# Patient Record
Sex: Male | Born: 1960 | Race: White | Hispanic: No | Marital: Married | Smoking: Never smoker
Health system: Southern US, Community
[De-identification: ages and names within clinical notes are randomized; demographics above are authoritative.]

## PROBLEM LIST (undated history)

## (undated) DIAGNOSIS — T7840XA Allergy, unspecified, initial encounter: Secondary | ICD-10-CM

## (undated) DIAGNOSIS — I10 Essential (primary) hypertension: Secondary | ICD-10-CM

## (undated) DIAGNOSIS — E785 Hyperlipidemia, unspecified: Secondary | ICD-10-CM

## (undated) HISTORY — DX: Essential (primary) hypertension: I10

## (undated) HISTORY — PX: TENDON REPAIR: SHX5111

## (undated) HISTORY — DX: Hyperlipidemia, unspecified: E78.5

## (undated) HISTORY — PX: HERNIA REPAIR: SHX51

## (undated) HISTORY — DX: Allergy, unspecified, initial encounter: T78.40XA

## (undated) HISTORY — PX: KNEE SURGERY: SHX244

## (undated) HISTORY — PX: OTHER SURGICAL HISTORY: SHX169

## (undated) HISTORY — PX: ROTATOR CUFF REPAIR: SHX139

---

## 1997-07-03 ENCOUNTER — Ambulatory Visit (HOSPITAL_COMMUNITY): Admission: RE | Admit: 1997-07-03 | Discharge: 1997-07-03 | Payer: Self-pay | Admitting: Orthopedic Surgery

## 1999-02-23 ENCOUNTER — Encounter: Payer: Self-pay | Admitting: Orthopedic Surgery

## 1999-02-23 ENCOUNTER — Ambulatory Visit (HOSPITAL_COMMUNITY): Admission: RE | Admit: 1999-02-23 | Discharge: 1999-02-23 | Payer: Self-pay | Admitting: Orthopedic Surgery

## 1999-11-05 ENCOUNTER — Ambulatory Visit (HOSPITAL_BASED_OUTPATIENT_CLINIC_OR_DEPARTMENT_OTHER): Admission: RE | Admit: 1999-11-05 | Discharge: 1999-11-05 | Payer: Self-pay | Admitting: Orthopedic Surgery

## 2003-05-09 ENCOUNTER — Ambulatory Visit (HOSPITAL_COMMUNITY): Admission: RE | Admit: 2003-05-09 | Discharge: 2003-05-09 | Payer: Self-pay | Admitting: General Surgery

## 2004-02-05 ENCOUNTER — Ambulatory Visit: Payer: Self-pay | Admitting: Family Medicine

## 2004-02-21 ENCOUNTER — Ambulatory Visit: Payer: Self-pay | Admitting: Family Medicine

## 2004-05-20 ENCOUNTER — Ambulatory Visit: Payer: Self-pay | Admitting: Family Medicine

## 2004-06-19 ENCOUNTER — Ambulatory Visit: Payer: Self-pay | Admitting: Family Medicine

## 2011-04-05 ENCOUNTER — Other Ambulatory Visit (HOSPITAL_COMMUNITY): Payer: Self-pay | Admitting: Urology

## 2011-04-05 ENCOUNTER — Ambulatory Visit (HOSPITAL_COMMUNITY)
Admission: RE | Admit: 2011-04-05 | Discharge: 2011-04-05 | Disposition: A | Discharge status: Home / Self Care | Payer: BC Managed Care – PPO | Source: Ambulatory Visit | Attending: Urology | Admitting: Urology

## 2011-04-05 DIAGNOSIS — R109 Unspecified abdominal pain: Secondary | ICD-10-CM | POA: Insufficient documentation

## 2011-04-05 DIAGNOSIS — N201 Calculus of ureter: Secondary | ICD-10-CM

## 2013-11-19 ENCOUNTER — Ambulatory Visit: Payer: BC Managed Care – PPO | Attending: Orthopedic Surgery | Admitting: Physical Therapy

## 2013-11-19 DIAGNOSIS — J45909 Unspecified asthma, uncomplicated: Secondary | ICD-10-CM | POA: Insufficient documentation

## 2013-11-19 DIAGNOSIS — R5381 Other malaise: Secondary | ICD-10-CM | POA: Diagnosis not present

## 2013-11-19 DIAGNOSIS — Z5189 Encounter for other specified aftercare: Secondary | ICD-10-CM | POA: Insufficient documentation

## 2013-11-19 DIAGNOSIS — I10 Essential (primary) hypertension: Secondary | ICD-10-CM | POA: Insufficient documentation

## 2013-11-19 DIAGNOSIS — M6281 Muscle weakness (generalized): Secondary | ICD-10-CM | POA: Insufficient documentation

## 2013-11-20 ENCOUNTER — Ambulatory Visit: Payer: BC Managed Care – PPO | Admitting: Physical Therapy

## 2013-11-26 ENCOUNTER — Ambulatory Visit: Payer: BC Managed Care – PPO | Admitting: Physical Therapy

## 2013-11-26 DIAGNOSIS — Z5189 Encounter for other specified aftercare: Secondary | ICD-10-CM | POA: Diagnosis not present

## 2013-11-30 ENCOUNTER — Telehealth: Payer: Self-pay | Admitting: Family Medicine

## 2013-11-30 NOTE — Telephone Encounter (Signed)
Pt given appt with dr Hyacinth Meekermiller for new pt and CPE,advised to arrive 15 minutes early and bring any current medications, he needs physical for work.

## 2014-02-07 ENCOUNTER — Ambulatory Visit: Payer: Self-pay | Admitting: Family Medicine

## 2015-07-23 ENCOUNTER — Ambulatory Visit (INDEPENDENT_AMBULATORY_CARE_PROVIDER_SITE_OTHER): Payer: Worker's Compensation

## 2015-07-23 ENCOUNTER — Ambulatory Visit (INDEPENDENT_AMBULATORY_CARE_PROVIDER_SITE_OTHER): Payer: Worker's Compensation | Admitting: Family Medicine

## 2015-07-23 ENCOUNTER — Encounter: Payer: Self-pay | Admitting: Family Medicine

## 2015-07-23 VITALS — BP 143/88 | HR 77 | Temp 97.5°F | Ht 67.0 in | Wt 200.0 lb

## 2015-07-23 DIAGNOSIS — M25511 Pain in right shoulder: Secondary | ICD-10-CM

## 2015-07-23 DIAGNOSIS — S46211A Strain of muscle, fascia and tendon of other parts of biceps, right arm, initial encounter: Secondary | ICD-10-CM | POA: Diagnosis not present

## 2015-07-23 MED ORDER — PREDNISONE 20 MG PO TABS: ORAL_TABLET | ORAL | Status: DC

## 2015-07-23 NOTE — Progress Notes (Signed)
BP 143/88 mmHg  Pulse 77  Temp(Src) 97.5 F (36.4 C) (Oral)  Ht  (1.702 m)  Wt 200 lb (90.719 kg)  BMI 31.32 kg/m2   Subjective:    Patient ID: Scott Chapman, male    DOB: 1960-12-06, 55 y.o.   MRN: [id number]  HPI: Scott Chapman is a 55 y.o. male presenting on 07/23/2015 for right shoulder pain   HPI Right shoulder pain Patient is coming in for worker's comp visit for right shoulder pain. He works at [employer]. The injury occurred on 07/18/2015. He was working in a forklift and hydraulic fluid spilled and when he was getting off his forklift he slipped on the oil and came down and landed on his right arm outstretched behind them. Since that time he thought it wasn't a big deal initially but he has been having increasing pain and soreness in that right arm. Today he says the pain is 6 out of 10. The pain is worse anteriorly and occasionally he feels it going down his bicep into his arm. He denies any numbness or weakness in that arm. He denies any pain in his neck or his upper back. He does have a little stiffness in his neck and back musculature though. He says it is worse with twisting and turning movements of the right arm like using a screwdriver.  Relevant past medical, surgical, family and social history reviewed and updated as indicated. Interim medical history since our last visit reviewed. Allergies and medications reviewed and updated.  Review of Systems  Constitutional: Negative for fever.  HENT: Negative for ear discharge and ear pain.   Eyes: Negative for discharge and visual disturbance.  Respiratory: Negative for shortness of breath and wheezing.   Cardiovascular: Negative for chest pain and leg swelling.  Gastrointestinal: Negative for abdominal pain, diarrhea and constipation.  Genitourinary: Negative for difficulty urinating.  Musculoskeletal: Positive for myalgias, arthralgias and neck stiffness. Negative for back pain, joint swelling, gait problem and  neck pain.  Skin: Negative for rash.  Neurological: Negative for syncope, light-headedness and headaches.  All other systems reviewed and are negative.   Per HPI unless specifically indicated above     Medication List       This list is accurate as of: 07/23/15  4:46 PM.  Always use your most recent med list.               clobetasol 0.05 % topical foam  Commonly known as:  OLUX  APPLY TOPICALLY 2 (TWO) TIMES DAILY.     olmesartan-hydrochlorothiazide 20-12.5 MG tablet  Commonly known as:  BENICAR HCT     pravastatin 40 MG tablet  Commonly known as:  PRAVACHOL     albuterol (2.5 MG/3ML) 0.083% nebulizer solution  Commonly known as:  PROVENTIL     PROAIR HFA 108 (90 Base) MCG/ACT inhaler  Generic drug:  albuterol     SYMBICORT 160-4.5 MCG/ACT inhaler  Generic drug:  budesonide-formoterol  USE 2 PUFFS INTO THE LUNGS 2 TIMES A DAY     VIAGRA 100 MG tablet  Generic drug:  sildenafil  TAKE 1 TABLET (100 MG TOTAL) BY MOUTH AS NEEDED FOR ERECTILE DYSFUNCTION.           Objective:    BP 143/88 mmHg  Pulse 77  Temp(Src) 97.5 F (36.4 C) (Oral)  Ht  (1.702 m)  Wt 200 lb (90.719 kg)  BMI 31.32 kg/m2  Wt Readings from Last 3 Encounters:  07/23/15  200 lb (90.719 kg)    Physical Exam  Constitutional: He is oriented to person, place, and time. He appears well-developed and well-nourished. No distress.  Eyes: Conjunctivae and EOM are normal. Pupils are equal, round, and reactive to light. Right eye exhibits no discharge. No scleral icterus.  Neck: Neck supple. No thyromegaly present.  Cardiovascular: Normal rate, regular rhythm, normal heart sounds and intact distal pulses.   No murmur heard. Pulmonary/Chest: Effort normal and breath sounds normal. No respiratory distress. He has no wheezes.  Musculoskeletal: Normal range of motion. He exhibits no edema.       Right shoulder: Normal. He exhibits normal range of motion, no tenderness, no bony tenderness, no  swelling, no effusion, no crepitus, no deformity and no pain.       Right elbow: Tenderness (Pain in upper bicipital tendon with supination and pronation) found.  Lymphadenopathy:    He has no cervical adenopathy.  Neurological: He is alert and oriented to person, place, and time. Coordination normal.  Skin: Skin is warm and dry. No rash noted. He is not diaphoretic.  Psychiatric: He has a normal mood and affect. His behavior is normal.  Nursing note and vitals reviewed.   Shoulder x-ray: No signs of acute bony abnormality, await final read by radiology    Assessment & Plan:   Problem List Items Addressed This Visit    None    Visit Diagnoses    Right shoulder pain    -  Primary    Relevant Medications    predniSONE (DELTASONE) 20 MG tablet    Other Relevant Orders    DG Shoulder Right (Completed)    Strain of biceps tendon, right, initial encounter        Relevant Medications    predniSONE (DELTASONE) 20 MG tablet        Follow up plan: Return if symptoms worsen or fail to improve.  Counseling provided for all of the vaccine components Orders Placed This Encounter  Procedures  . DG Shoulder Right    Arville CareJoshua Dettinger, MD [facility] 07/23/2015, 4:46 PM

## 2015-07-25 ENCOUNTER — Encounter: Payer: Self-pay | Admitting: Family Medicine

## 2015-08-04 ENCOUNTER — Ambulatory Visit (INDEPENDENT_AMBULATORY_CARE_PROVIDER_SITE_OTHER): Payer: Worker's Compensation | Admitting: Family Medicine

## 2015-08-04 ENCOUNTER — Encounter: Payer: Self-pay | Admitting: Family Medicine

## 2015-08-04 VITALS — BP 133/86 | HR 86 | Temp 97.1°F | Ht 67.0 in | Wt 202.8 lb

## 2015-08-04 DIAGNOSIS — M25511 Pain in right shoulder: Secondary | ICD-10-CM | POA: Diagnosis not present

## 2015-08-04 DIAGNOSIS — S46211A Strain of muscle, fascia and tendon of other parts of biceps, right arm, initial encounter: Secondary | ICD-10-CM

## 2015-08-04 NOTE — Progress Notes (Signed)
BP 133/86 (BP Location: Left Arm, Patient Position: Sitting, Cuff Size: Large)   Pulse 86   Temp 97.1 F (36.2 C) (Oral)   Ht  (1.702 m)   Wt 202 lb 12.8 oz (92 kg)   BMI 31.76 kg/m    Subjective:    Patient ID: Scott Chapman, male    DOB: [birth year], 55 y.o.   MRN: [id number]  HPI: JODI KAPPES is a 55 y.o. male presenting on 08/04/2015 for [facility] followup right shoulder pain   HPI Right shoulder pain Patient is coming in for follow-up for worker's comp on right shoulder pain. The injury occurred on 07/18/2015. He works for [employer]. 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 The pain today is 2 out of 10 with certain movements but gone at rest. The pain has improved but he still feels like he has some weakness especially with grip and supination and pronation. He denies any numbness in the arm. He is still little stiff but has full range of motion. He is most concerned that he still has a popping or catching in the shoulder when he pronates and supinates and that it feels weak.  Relevant past medical, surgical, family and social history reviewed and updated as indicated. Interim medical history since our last visit reviewed. Allergies and medications reviewed and updated.  Review of Systems  Constitutional: Negative for fever.  HENT: Negative for ear discharge and ear pain.   Eyes: Negative for discharge and visual disturbance.  Respiratory: Negative for shortness of breath and wheezing.   Cardiovascular: Negative for chest pain and leg swelling.  Gastrointestinal: Negative for abdominal pain, constipation and diarrhea.  Genitourinary: Negative for difficulty urinating.  Musculoskeletal: Positive for arthralgias, myalgias and neck stiffness. Negative for back pain, gait problem, joint swelling and neck pain.  Skin: Negative for rash.  Neurological: Negative  for syncope, light-headedness and headaches.  All other systems reviewed and are negative.   Per HPI unless specifically indicated above     Medication List       Accurate as of 08/04/15  5:04 PM. Always use your most recent med list.          clobetasol 0.05 % topical foam Commonly known as:  OLUX APPLY TOPICALLY 2 (TWO) TIMES DAILY.   olmesartan-hydrochlorothiazide 20-12.5 MG tablet Commonly known as:  BENICAR HCT   pravastatin 40 MG tablet Commonly known as:  PRAVACHOL   predniSONE 20 MG tablet Commonly known as:  DELTASONE 2 po at same time daily for 5 days   albuterol (2.5 MG/3ML) 0.083% nebulizer solution Commonly known as:  PROVENTIL   PROAIR HFA 108 (90 Base) MCG/ACT inhaler Generic drug:  albuterol   SYMBICORT 160-4.5 MCG/ACT inhaler Generic drug:  budesonide-formoterol USE 2 PUFFS INTO THE LUNGS 2 TIMES A DAY   VIAGRA 100 MG tablet Generic drug:  sildenafil TAKE 1 TABLET (100 MG TOTAL) BY MOUTH AS NEEDED FOR ERECTILE DYSFUNCTION.          Objective:    BP 133/86 (BP Location: Left Arm, Patient Position: Sitting, Cuff Size: Large)   Pulse 86   Temp 97.1 F (36.2 C) (Oral)   Ht  (1.702 m)   Wt 202 lb 12.8 oz (92 kg)   BMI 31.76 kg/m   Wt Readings from Last 3 Encounters:  08/04/15 202 lb 12.8 oz (92  kg)  07/23/15 200 lb (90.7 kg)    Physical Exam  Constitutional: He is oriented to person, place, and time. He appears well-developed and well-nourished. No distress.  Eyes: Conjunctivae and EOM are normal. Pupils are equal, round, and reactive to light. Right eye exhibits no discharge. No scleral icterus.  Neck: Neck supple. No thyromegaly present.  Cardiovascular: Normal rate, regular rhythm, normal heart sounds and intact distal pulses.   No murmur heard. Pulmonary/Chest: Effort normal and breath sounds normal. No respiratory distress. He has no wheezes.  Musculoskeletal: Normal range of motion. He exhibits no edema.       Right shoulder:  Normal. He exhibits normal range of motion, no tenderness, no bony tenderness, no swelling, no effusion, no crepitus, no deformity and no pain.       Right elbow: Tenderness (Pain in upper bicipital tendon with supination and pronation. Patient has perceived weakness but not notable on exam) found.  Lymphadenopathy:    He has no cervical adenopathy.  Neurological: He is alert and oriented to person, place, and time. Coordination normal.  Skin: Skin is warm and dry. No rash noted. He is not diaphoretic.  Psychiatric: He has a normal mood and affect. His behavior is normal.  Nursing note and vitals reviewed.   No results found for this or any previous visit.    Assessment & Plan:   Problem List Items Addressed This Visit    None    Visit Diagnoses    Right shoulder pain    -  Primary   Relevant Orders   MR Shoulder Right Wo Contrast   Strain of biceps tendon, right, initial encounter       Relevant Orders   MR Shoulder Right Wo Contrast       Follow up plan: Return if symptoms worsen or fail to improve.  Counseling provided for all of the vaccine components Orders Placed This Encounter  Procedures  . MR Shoulder Right Wo Contrast    Arville Care, MD [facility] 08/04/2015, 5:04 PM

## 2015-08-13 ENCOUNTER — Other Ambulatory Visit: Payer: Self-pay

## 2015-08-13 DIAGNOSIS — M75100 Unspecified rotator cuff tear or rupture of unspecified shoulder, not specified as traumatic: Secondary | ICD-10-CM

## 2015-08-15 ENCOUNTER — Telehealth: Payer: Self-pay | Admitting: Family Medicine

## 2015-08-15 NOTE — Telephone Encounter (Signed)
Spoke with pt and advised we had did a referral for ortho due to torn tendon in rotator cuff. Pt voiced understanding.

## 2015-08-18 ENCOUNTER — Telehealth: Payer: Self-pay | Admitting: Family Medicine

## 2015-08-18 NOTE — Telephone Encounter (Signed)
Pt called and [facility] and now he is going to ortho - fyi

## 2015-08-20 ENCOUNTER — Ambulatory Visit (HOSPITAL_COMMUNITY): Payer: Self-pay

## 2015-10-25 ENCOUNTER — Ambulatory Visit (INDEPENDENT_AMBULATORY_CARE_PROVIDER_SITE_OTHER): Payer: BLUE CROSS/BLUE SHIELD

## 2015-10-25 DIAGNOSIS — Z23 Encounter for immunization: Secondary | ICD-10-CM

## 2015-10-28 ENCOUNTER — Encounter: Payer: Self-pay | Admitting: Physical Therapy

## 2015-10-28 ENCOUNTER — Ambulatory Visit: Payer: Worker's Compensation | Attending: Orthopedic Surgery | Admitting: Physical Therapy

## 2015-10-28 DIAGNOSIS — M25511 Pain in right shoulder: Secondary | ICD-10-CM | POA: Diagnosis present

## 2015-10-28 DIAGNOSIS — M6281 Muscle weakness (generalized): Secondary | ICD-10-CM | POA: Diagnosis present

## 2015-10-28 DIAGNOSIS — M25611 Stiffness of right shoulder, not elsewhere classified: Secondary | ICD-10-CM | POA: Diagnosis present

## 2015-10-28 NOTE — Therapy (Signed)
[facility] [address] [place], [place], [address] Phone: [phone]   Fax:  [phone]  Physical Therapy Evaluation  Patient Details  Name: Scott Chapman MRN: [id number] Date of Birth: [birth year] Referring Provider: Marciano Sequin PA-C  Encounter Date: 10/28/2015      PT End of Session - 10/28/15 1309    Visit Number 1   Number of Visits 18   Date for PT Re-Evaluation 12/23/15   PT Start Time 1309   PT Stop Time 1401   PT Time Calculation (min) 52 min   Activity Tolerance Patient tolerated treatment well   Behavior During Therapy [facility] for tasks assessed/performed      Past Medical History:  Diagnosis Date  . Allergy   . Hyperlipidemia   . Hypertension     Past Surgical History:  Procedure Laterality Date  . Colapsed lung Right   . HERNIA REPAIR    . left arm surgery    . TENDON REPAIR Right    arm    There were no vitals filed for this visit.       Subjective Assessment - 10/28/15 1303    Subjective Patient underwent R RCR surgery for massive tear on 10/14/15.    Pertinent History HTN, asthma   Currently in Pain? Yes   Pain Score 2    Pain Location Shoulder   Pain Orientation Right   Pain Type Surgical pain   Pain Onset In the past 7 days   Aggravating Factors  night   Pain Relieving Factors meds or positioning   Effect of Pain on Daily Activities limited; unable to work            [facility] PT Assessment - 10/28/15 0001      Assessment   Medical Diagnosis s/p R RCR   Referring Provider Marciano Sequin PA-C   Onset Date/Surgical Date 10/14/15   Hand Dominance Right   Next MD Visit 11/19/15     Precautions   Precautions Shoulder   Type of Shoulder Precautions RCR see protocol   Precaution Comments no lifting, excessive ext, stretching or sudden movements     Balance Screen   Has the patient fallen in the past 6 months No   Has the patient had a decrease in activity level because of a fear of  falling?  No   Is the patient reluctant to leave their home because of a fear of falling?  No     Prior Function   Level of Independence Independent with basic ADLs   Vocation Full time employment     ROM / Strength   AROM / PROM / Strength PROM     PROM   PROM Assessment Site Shoulder   Right/Left Shoulder Right   Right Shoulder Flexion 125 Degrees   Right Shoulder Internal Rotation 35 Degrees   Right Shoulder External Rotation 30 Degrees                   [facility] Adult PT Treatment/Exercise - 10/28/15 0001      Modalities   Modalities Electrical Stimulation;Cryotherapy     Cryotherapy   Number Minutes Cryotherapy 15 Minutes   Cryotherapy Location Shoulder   Type of Cryotherapy Ice pack     Electrical Stimulation   Electrical Stimulation Location premod 1-10 Hz to R shoulder x 15 min to tolerance   Electrical Stimulation Goals Edema;Pain                PT  Education - 10/28/15 1643    Education provided Yes   Education Details HEP   Person(s) Educated Patient   Methods Explanation;Demonstration;Verbal cues;Handout   Comprehension Verbalized understanding;Returned demonstration          PT Short Term Goals - 10/28/15 1649      PT SHORT TERM GOAL #1   Title I with HEP   Time 4   Period Weeks   Status New     PT SHORT TERM GOAL #2   Title PROM of R shoulder to Protocol limits   Time 4   Period Weeks   Status New           PT Long Term Goals - 10/28/15 1650      PT LONG TERM GOAL #1   Title R shoulder ROM WFL to perform ADLs   Time 8   Period Weeks   Status New     PT LONG TERM GOAL #2   Title R shoulder strength 5/5 to allow RTW   Time 8   Period Weeks   Status New     PT LONG TERM GOAL #3   Title Able to peform ADLS with pain 2/10  or less in R shoulder   Time 8   Period Weeks   Status New               Plan - 10/28/15 1643    Clinical Impression Statement Patient presents s/p R RCR on 10/14/15. He has pain  and limited ROM affecting ADLS. He is anxious to RTW.   Rehab Potential Excellent   Clinical Impairments Affecting Rehab Potential R biceps tendon repair 2015   PT Frequency 3x / week  2-3x/wk   PT Duration 8 weeks   PT Treatment/Interventions ADLs/Self Care Home Management;Electrical Stimulation;Cryotherapy;Ultrasound;Patient/family education;Neuromuscular re-education;Therapeutic exercise;Manual techniques;Scar mobilization;Passive range of motion;Vasopneumatic Device   PT Next Visit Plan PROM within protocol ranges 2 wks - 10/28/15 (flex 105/ER 20-40/IR 35-45 All in scapular plane); 4 wks - 11/11/15 (flex 120/ABD 110/ ER 45 deg)   PT Home Exercise Plan supine ER/IR with cane, pendulums, elbow flex/ext   Consulted and Agree with Plan of Care Patient      Patient will benefit from skilled therapeutic intervention in order to improve the following deficits and impairments:  Decreased range of motion, Pain, Impaired UE functional use, Decreased strength  Visit Diagnosis: Stiffness of right shoulder, not elsewhere classified - Plan: PT plan of care cert/re-cert  Acute pain of right shoulder - Plan: PT plan of care cert/re-cert  Muscle weakness (generalized) - Plan: PT plan of care cert/re-cert     Problem List There are no active problems to display for this patient.   Scott PalmJulie Laszlo Ellerby PT 10/28/2015, 4:55 PM  [facility] [address] [place], [place], [address] Phone: [phone]   Fax:  [phone]  Name: Scott Chapman MRN: [id number] Date of Birth: 1960/11/17

## 2015-10-28 NOTE — Patient Instructions (Signed)
ROM: Pendulum (Circular)  Let right arm move in circle clockwise, then counterclockwise, by rocking body weight in circular pattern. Circle _10___ times each direction per set. Do _3___ sessions per day.  Pendulum Side to Side  Bend forward 90 at waist, leaning on table for support. Rock body from side to side and let arm swing freely. Repeat _10___ times. Do __3__ sessions per day.  AROM: Elbow Flexion / Extension  DO LYING DOWN   With left hand palm up, gently bend elbow as far as possible. Then straighten arm as far as possible. Repeat __10__ times per set. Do __3__ sessions per day.    SHOULDER: External Rotation - Supine (Cane)  ONLY 30 DEGREES EACH WAY FOR RIGHT NOW!   Hold cane with both hands. Rotate arm away from body. Keep elbow on floor and next to body. 10-20___ reps per set, _3-4__ sets per day, ___ days per week  Solon PalmJulie Ahley Bulls, PT 10/28/15 1:39 PM [facility] [address] [place], [place], [address] Phone: [phone]   Fax:  [phone]

## 2015-10-29 ENCOUNTER — Ambulatory Visit: Payer: Worker's Compensation | Admitting: Physical Therapy

## 2015-10-29 ENCOUNTER — Encounter: Payer: Self-pay | Admitting: Physical Therapy

## 2015-10-29 DIAGNOSIS — M25611 Stiffness of right shoulder, not elsewhere classified: Secondary | ICD-10-CM | POA: Diagnosis not present

## 2015-10-29 DIAGNOSIS — M25511 Pain in right shoulder: Secondary | ICD-10-CM

## 2015-10-29 DIAGNOSIS — M6281 Muscle weakness (generalized): Secondary | ICD-10-CM

## 2015-10-29 NOTE — Therapy (Signed)
[facility] [address] [place], [place], [address] Phone: [phone]   Fax:  [phone]  Physical Therapy Treatment  Patient Details  Name: DEMONI GERGEN MRN: [id number] Date of Birth: [birth year] Referring Provider: Marciano Sequin PA-C  Encounter Date: 10/29/2015      PT End of Session - 10/29/15 1340    Visit Number 2   Number of Visits 18   Date for PT Re-Evaluation 12/23/15   PT Start Time 1351   PT Stop Time 1434   PT Time Calculation (min) 43 min   Activity Tolerance Patient tolerated treatment well   Behavior During Therapy [facility] for tasks assessed/performed      Past Medical History:  Diagnosis Date  . Allergy   . Hyperlipidemia   . Hypertension     Past Surgical History:  Procedure Laterality Date  . Colapsed lung Right   . HERNIA REPAIR    . left arm surgery    . TENDON REPAIR Right    arm    There were no vitals filed for this visit.      Subjective Assessment - 10/29/15 1340    Subjective Reports that with doing HEP at home he felt his shoulder pop. Reports that he knows he needs to slow down.   Pertinent History HTN, asthma   Currently in Pain? No/denies            [facility] PT Assessment - 10/29/15 0001      Assessment   Medical Diagnosis s/p R RCR   Onset Date/Surgical Date 10/14/15   Hand Dominance Right   Next MD Visit 11/19/15     Precautions   Precautions Shoulder   Type of Shoulder Precautions RCR see protocol   Precaution Comments no lifting, excessive ext, stretching or sudden movements                     [facility] Adult PT Treatment/Exercise - 10/29/15 0001      Exercises   Exercises Shoulder     Modalities   Modalities Electrical Stimulation;Cryotherapy     Cryotherapy   Number Minutes Cryotherapy 15 Minutes   Cryotherapy Location Shoulder   Type of Cryotherapy Ice pack     Electrical Stimulation   Electrical Stimulation Location R shoulder   Electrical  Stimulation Action Pre-Mod   Electrical Stimulation Parameters 80-150 hz x15 min   Electrical Stimulation Goals Edema;Pain     Manual Therapy   Manual Therapy Passive ROM   Passive ROM PROM of R shoulder into flex/ER/IR with gentle holds at end range                PT Education - 10/28/15 1643    Education provided Yes   Education Details HEP   Person(s) Educated Patient   Methods Explanation;Demonstration;Verbal cues;Handout   Comprehension Verbalized understanding;Returned demonstration          PT Short Term Goals - 10/28/15 1649      PT SHORT TERM GOAL #1   Title I with HEP   Time 4   Period Weeks   Status New     PT SHORT TERM GOAL #2   Title PROM of R shoulder to Protocol limits   Time 4   Period Weeks   Status New           PT Long Term Goals - 10/28/15 1650      PT LONG TERM GOAL #1   Title R shoulder ROM [facility]  to perform ADLs   Time 8   Period Weeks   Status New     PT LONG TERM GOAL #2   Title R shoulder strength 5/5 to allow RTW   Time 8   Period Weeks   Status New     PT LONG TERM GOAL #3   Title Able to peform ADLS with pain 2/10  or less in R shoulder   Time 8   Period Weeks   Status New               Plan - 10/29/15 1423    Clinical Impression Statement Patient presented in clinic with abduction sling donned and with denial of pain. Patient anxious regarding return to recreational activities and work. Patient also reported anxiety of reinjuring shoulder to which he was educated to continue precautions set at PT and MD. Firm end feels noted with PROM of R shoulder into flexion/ER/IR with smooth arc of motion noted. PROM of R shoulder into ER restricted more than other ROM directions assessed today. Normal modalities response noted following removal of the modalities.   Rehab Potential Excellent   Clinical Impairments Affecting Rehab Potential R biceps tendon repair 2015   PT Frequency 3x / week   PT Duration 8 weeks   PT  Treatment/Interventions ADLs/Self Care Home Management;Electrical Stimulation;Cryotherapy;Ultrasound;Patient/family education;Neuromuscular re-education;Therapeutic exercise;Manual techniques;Scar mobilization;Passive range of motion;Vasopneumatic Device   PT Next Visit Plan PROM within protocol ranges 2 wks - 10/28/15 (flex 105/ER 20-40/IR 35-45 All in scapular plane); 4 wks - 11/11/15 (flex 120/ABD 110/ ER 45 deg)   PT Home Exercise Plan supine ER/IR with cane, pendulums, elbow flex/ext   Consulted and Agree with Plan of Care Patient      Patient will benefit from skilled therapeutic intervention in order to improve the following deficits and impairments:  Decreased range of motion, Pain, Impaired UE functional use, Decreased strength  Visit Diagnosis: Stiffness of right shoulder, not elsewhere classified  Acute pain of right shoulder  Muscle weakness (generalized)     Problem List There are no active problems to display for this patient.   Evelene CroonKelsey M Parsons, PTA 10/29/2015, 2:43 PM  [facility] [address] [place], [place], [id number] Phone: [phone]   Fax:  [phone]  Name: Amanda CockayneJames M Niehoff MRN: [id number] Date of Birth: 09-20-1960

## 2015-10-30 ENCOUNTER — Ambulatory Visit: Payer: Worker's Compensation | Admitting: *Deleted

## 2015-10-30 DIAGNOSIS — M25611 Stiffness of right shoulder, not elsewhere classified: Secondary | ICD-10-CM | POA: Diagnosis not present

## 2015-10-30 DIAGNOSIS — M25511 Pain in right shoulder: Secondary | ICD-10-CM

## 2015-10-30 DIAGNOSIS — M6281 Muscle weakness (generalized): Secondary | ICD-10-CM

## 2015-10-30 NOTE — Therapy (Signed)
[facility] [address] [place], [place], [address] Phone: [phone]   Fax:  [phone]  Physical Therapy Treatment  Patient Details  Name: BILBO CARCAMO MRN: [id number] Date of Birth: [birth year] Referring Provider: Marciano Sequin PA-C  Encounter Date: 10/30/2015      PT End of Session - 10/30/15 1447    Visit Number 3   Number of Visits 18   Date for PT Re-Evaluation 12/23/15   PT Start Time 1345   PT Stop Time 1435   PT Time Calculation (min) 50 min      Past Medical History:  Diagnosis Date  . Allergy   . Hyperlipidemia   . Hypertension     Past Surgical History:  Procedure Laterality Date  . Colapsed lung Right   . HERNIA REPAIR    . left arm surgery    . TENDON REPAIR Right    arm    There were no vitals filed for this visit.      Subjective Assessment - 10/30/15 1429    Subjective Reports that with doing HEP at home he felt his shoulder pop. Reports that he knows he needs to slow down.   Pertinent History HTN, asthma   Currently in Pain? No/denies                         [facility] Adult PT Treatment/Exercise - 10/30/15 0001      Modalities   Modalities Electrical Stimulation;Cryotherapy     Cryotherapy   Number Minutes Cryotherapy 15 Minutes   Cryotherapy Location Shoulder   Type of Cryotherapy Ice pack     Electrical Stimulation   Electrical Stimulation Location premod 1-10 Hz to R shoulder x 15 min to tolerance   Electrical Stimulation Goals Edema;Pain     Manual Therapy   Manual Therapy Passive ROM   Passive ROM PROM of R shoulder into flex/ER/IR with gentle holds at end range within protocol limits                  PT Short Term Goals - 10/28/15 1649      PT SHORT TERM GOAL #1   Title I with HEP   Time 4   Period Weeks   Status New     PT SHORT TERM GOAL #2   Title PROM of R shoulder to Protocol limits   Time 4   Period Weeks   Status New            PT Long Term Goals - 10/28/15 1650      PT LONG TERM GOAL #1   Title R shoulder ROM WFL to perform ADLs   Time 8   Period Weeks   Status New     PT LONG TERM GOAL #2   Title R shoulder strength 5/5 to allow RTW   Time 8   Period Weeks   Status New     PT LONG TERM GOAL #3   Title Able to peform ADLS with pain 2/10  or less in R shoulder   Time 8   Period Weeks   Status New               Plan - 10/30/15 1449    Clinical Impression Statement Pt did fairly well with Rx today and was able to reach end-ranges as per protocol for all motions. He knows he needs to be more patient because of the massive- tear he had  and that it's going to take a while. Normal modality response   Rehab Potential Excellent   Clinical Impairments Affecting Rehab Potential R biceps tendon repair 2015   PT Frequency 3x / week   PT Duration 8 weeks   PT Treatment/Interventions ADLs/Self Care Home Management;Electrical Stimulation;Cryotherapy;Ultrasound;Patient/family education;Neuromuscular re-education;Therapeutic exercise;Manual techniques;Scar mobilization;Passive range of motion;Vasopneumatic Device   PT Next Visit Plan PROM within protocol ranges 2 wks - 10/28/15 (flex 105/ER 20-40/IR 35-45 All in scapular plane); 4 wks - 11/11/15 (flex 120/ABD 110/ ER 45 deg)   PT Home Exercise Plan supine ER/IR with cane, pendulums, elbow flex/ext   Consulted and Agree with Plan of Care Patient      Patient will benefit from skilled therapeutic intervention in order to improve the following deficits and impairments:  Decreased range of motion, Pain, Impaired UE functional use, Decreased strength  Visit Diagnosis: Stiffness of right shoulder, not elsewhere classified  Acute pain of right shoulder  Muscle weakness (generalized)     Problem List There are no active problems to display for this patient.   Ayzia Day,CHRIS, PTA 10/30/2015, 5:33 PM  [facility] [address] [place], [place], [address] Phone: [phone]   Fax:  [phone]  Name: Amanda CockayneJames M Stockham MRN: [id number] Date of Birth: [birth year]

## 2015-11-04 ENCOUNTER — Ambulatory Visit: Payer: Worker's Compensation | Admitting: Physical Therapy

## 2015-11-04 DIAGNOSIS — M6281 Muscle weakness (generalized): Secondary | ICD-10-CM

## 2015-11-04 DIAGNOSIS — M25611 Stiffness of right shoulder, not elsewhere classified: Secondary | ICD-10-CM

## 2015-11-04 DIAGNOSIS — M25511 Pain in right shoulder: Secondary | ICD-10-CM

## 2015-11-04 NOTE — Therapy (Signed)
[facility] [address] [place], [place], [address] Phone: [phone]   Fax:  [phone]  Physical Therapy Treatment  Patient Details  Name: Scott Chapman MRN: [id number] Date of Birth: Jul 27, 1960 Referring Provider: Marciano Sequin PA-C  Encounter Date: 11/04/2015      PT End of Session - 11/04/15 1535    PT Start Time 0100   PT Stop Time 0202   PT Time Calculation (min) 62 min   Activity Tolerance Patient tolerated treatment well   Behavior During Therapy [facility] for tasks assessed/performed      Past Medical History:  Diagnosis Date  . Allergy   . Hyperlipidemia   . Hypertension     Past Surgical History:  Procedure Laterality Date  . Colapsed lung Right   . HERNIA REPAIR    . left arm surgery    . TENDON REPAIR Right    arm    There were no vitals filed for this visit.      Subjective Assessment - 11/04/15 1529    Subjective I haven't always worn my sling like I should.   Pain Score 2    Pain Location Shoulder   Pain Orientation Right   Pain Type Surgical pain   Pain Onset In the past 7 days                         [facility] Adult PT Treatment/Exercise - 11/04/15 0001      Electrical Stimulation   Electrical Stimulation Location Pre-mod to rt shoulder x 15 minutes with CP.     Manual Therapy   Manual Therapy Passive ROM   Passive ROM PROM x 40 minutes in supine..                  PT Short Term Goals - 10/28/15 1649      PT SHORT TERM GOAL #1   Title I with HEP   Time 4   Period Weeks   Status New     PT SHORT TERM GOAL #2   Title PROM of R shoulder to Protocol limits   Time 4   Period Weeks   Status New           PT Long Term Goals - 10/28/15 1650      PT LONG TERM GOAL #1   Title R shoulder ROM WFL to perform ADLs   Time 8   Period Weeks   Status New     PT LONG TERM GOAL #2   Title R shoulder strength 5/5 to allow RTW   Time 8   Period Weeks   Status  New     PT LONG TERM GOAL #3   Title Able to peform ADLS with pain 2/10  or less in R shoulder   Time 8   Period Weeks   Status New             Patient will benefit from skilled therapeutic intervention in order to improve the following deficits and impairments:  Decreased range of motion, Pain, Impaired UE functional use, Decreased strength  Visit Diagnosis: Stiffness of right shoulder, not elsewhere classified  Acute pain of right shoulder  Muscle weakness (generalized)     Problem List There are no active problems to display for this patient.   [name], [place] MPT 11/04/2015, 3:37 PM  [facility] [address] [place], [place], [address] Phone: [phone]   Fax:  [phone]  Name: Scott Chapman MRN: [id number] Date of Birth: 12/23/1960

## 2015-11-05 ENCOUNTER — Ambulatory Visit: Payer: Worker's Compensation | Admitting: Physical Therapy

## 2015-11-05 ENCOUNTER — Ambulatory Visit: Payer: Self-pay | Admitting: Physical Therapy

## 2015-11-05 ENCOUNTER — Encounter: Payer: Self-pay | Admitting: Physical Therapy

## 2015-11-05 DIAGNOSIS — M25511 Pain in right shoulder: Secondary | ICD-10-CM

## 2015-11-05 DIAGNOSIS — M25611 Stiffness of right shoulder, not elsewhere classified: Secondary | ICD-10-CM

## 2015-11-05 DIAGNOSIS — M6281 Muscle weakness (generalized): Secondary | ICD-10-CM

## 2015-11-05 NOTE — Therapy (Signed)
[facility] [address] [place], [place], [address] Phone: [phone]   Fax:  [phone]  Physical Therapy Treatment  Patient Details  Name: Scott Chapman MRN: [id number] Date of Birth: 26-Apr-1960 Referring Provider: Marciano Sequin PA-C  Encounter Date: 11/05/2015      PT End of Session - 11/05/15 1351    Visit Number 5   Number of Visits 18   Date for PT Re-Evaluation 12/23/15   PT Start Time 1317   PT Stop Time 1407   PT Time Calculation (min) 50 min   Activity Tolerance Patient tolerated treatment well   Behavior During Therapy [facility] for tasks assessed/performed      Past Medical History:  Diagnosis Date  . Allergy   . Hyperlipidemia   . Hypertension     Past Surgical History:  Procedure Laterality Date  . Colapsed lung Right   . HERNIA REPAIR    . left arm surgery    . TENDON REPAIR Right    arm    There were no vitals filed for this visit.      Subjective Assessment - 11/05/15 1321    Subjective Patient has more pain at night   Pertinent History HTN, asthma   Currently in Pain? No/denies                         [facility] Adult PT Treatment/Exercise - 11/05/15 0001      Electrical Stimulation   Electrical Stimulation Location right shoulder   Electrical Stimulation Action premod   Electrical Stimulation Parameters 80-150hz    Electrical Stimulation Goals Edema;Pain     Manual Therapy   Manual Therapy Passive ROM   Passive ROM gentle PROM for right shoulder flexion and ER within protocol limits                  PT Short Term Goals - 11/05/15 1353      PT SHORT TERM GOAL #1   Title I with HEP   Time 4   Period Weeks   Status Achieved     PT SHORT TERM GOAL #2   Title PROM of R shoulder to Protocol limits   Time 4   Period Weeks   Status On-going           PT Long Term Goals - 10/28/15 1650      PT LONG TERM GOAL #1   Title R shoulder ROM WFL to perform ADLs    Time 8   Period Weeks   Status New     PT LONG TERM GOAL #2   Title R shoulder strength 5/5 to allow RTW   Time 8   Period Weeks   Status New     PT LONG TERM GOAL #3   Title Able to peform ADLS with pain 2/10  or less in R shoulder   Time 8   Period Weeks   Status New               Plan - 11/05/15 1353    Clinical Impression Statement Patient progressing with little soreness overall and tolerated treatment well. Patient reports doing HEP daily as directed by MPT. Patient PROM within current protocol limitations today. Patient able to meet STG#1 today, others ongoing due to healing and protocol limitations.    Rehab Potential Excellent   Clinical Impairments Affecting Rehab Potential R biceps tendon repair 2015 current surgery 10/14/15 -3weeks as of 11/04/15  PT Frequency 3x / week   PT Duration 8 weeks   PT Treatment/Interventions ADLs/Self Care Home Management;Electrical Stimulation;Cryotherapy;Ultrasound;Patient/family education;Neuromuscular re-education;Therapeutic exercise;Manual techniques;Scar mobilization;Passive range of motion;Vasopneumatic Device   PT Next Visit Plan PROM within protocol ranges 2 wks - 10/28/15 (flex 105/ER 20-40/IR 35-45 All in scapular plane); 4 wks - 11/11/15 (flex 120/ABD 110/ ER 45 deg) (MD. Thomasena Edisollins 11/19/15)   Consulted and Agree with Plan of Care Patient      Patient will benefit from skilled therapeutic intervention in order to improve the following deficits and impairments:  Decreased range of motion, Pain, Impaired UE functional use, Decreased strength  Visit Diagnosis: Stiffness of right shoulder, not elsewhere classified  Acute pain of right shoulder  Muscle weakness (generalized)     Problem List There are no active problems to display for this patient.   Hermelinda DellenDUNFORD, Veta Dambrosia P, PTA 11/05/2015, 2:07 PM  [facility] [address] [place], [place], [address] Phone: [phone]    Fax:  [phone]  Name: Scott Chapman MRN: [id number] Date of Birth: 22-Aug-1960

## 2015-11-06 ENCOUNTER — Ambulatory Visit: Payer: Worker's Compensation | Admitting: *Deleted

## 2015-11-06 DIAGNOSIS — M6281 Muscle weakness (generalized): Secondary | ICD-10-CM

## 2015-11-06 DIAGNOSIS — M25611 Stiffness of right shoulder, not elsewhere classified: Secondary | ICD-10-CM

## 2015-11-06 DIAGNOSIS — M25511 Pain in right shoulder: Secondary | ICD-10-CM

## 2015-11-06 NOTE — Therapy (Signed)
[facility] [address] [place], [place], [address] Phone: [phone]   Fax:  [phone]  Physical Therapy Treatment  Patient Details  Name: Scott Chapman MRN: [id number] Date of Birth: Sep 05, 1960 Referring Provider: Marciano Sequin PA-C  Encounter Date: 11/06/2015      PT End of Session - 11/06/15 1526    Visit Number 6   Number of Visits 18   Date for PT Re-Evaluation 12/23/15   PT Start Time 1430   PT Stop Time 1520   PT Time Calculation (min) 50 min      Past Medical History:  Diagnosis Date  . Allergy   . Hyperlipidemia   . Hypertension     Past Surgical History:  Procedure Laterality Date  . Colapsed lung Right   . HERNIA REPAIR    . left arm surgery    . TENDON REPAIR Right    arm    There were no vitals filed for this visit.      Subjective Assessment - 11/06/15 1429    Subjective Patient has more pain at night. Certain positions. No pillow now for 2 days   Pertinent History HTN, asthma   Currently in Pain? Yes   Pain Score 2    Pain Location Shoulder   Pain Orientation Right   Pain Type Surgical pain                         [facility] Adult PT Treatment/Exercise - 11/06/15 0001      Modalities   Modalities Electrical Stimulation;Cryotherapy     Electrical Stimulation   Electrical Stimulation Location Pre-mod to rt shoulder x 15 minutes with CP.   Electrical Stimulation Goals Edema;Pain     Manual Therapy   Manual Therapy Passive ROM   Passive ROM gentle PROM for right shoulder flexion and ER within protocol limits                  PT Short Term Goals - 11/05/15 1353      PT SHORT TERM GOAL #1   Title I with HEP   Time 4   Period Weeks   Status Achieved     PT SHORT TERM GOAL #2   Title PROM of R shoulder to Protocol limits   Time 4   Period Weeks   Status On-going           PT Long Term Goals - 10/28/15 1650      PT LONG TERM GOAL #1   Title R  shoulder ROM WFL to perform ADLs   Time 8   Period Weeks   Status New     PT LONG TERM GOAL #2   Title R shoulder strength 5/5 to allow RTW   Time 8   Period Weeks   Status New     PT LONG TERM GOAL #3   Title Able to peform ADLS with pain 2/10  or less in R shoulder   Time 8   Period Weeks   Status New               Plan - 11/06/15 1509    Clinical Impression Statement Pt did great with Rx again. He was able to relax during PROM and reach end-range motions per protocol limitations. LTGs are ongoing. Increase ranges at 4 weeks    Clinical Impairments Affecting Rehab Potential R biceps tendon repair 2015 current surgery 10/14/15 -3weeks as of 11/04/15  PT Frequency 3x / week   PT Duration 8 weeks   PT Treatment/Interventions ADLs/Self Care Home Management;Electrical Stimulation;Cryotherapy;Ultrasound;Patient/family education;Neuromuscular re-education;Therapeutic exercise;Manual techniques;Scar mobilization;Passive range of motion;Vasopneumatic Device   PT Next Visit Plan PROM within protocol ranges 2 wks - 10/28/15 (flex 105/ER 20-40/IR 35-45 All in scapular plane); 4 wks - 11/11/15 (flex 120/ABD 110/ ER 45 deg) (MD. Thomasena Edisollins 11/19/15)   PT Home Exercise Plan supine ER/IR with cane, pendulums, elbow flex/ext   Consulted and Agree with Plan of Care Patient      Patient will benefit from skilled therapeutic intervention in order to improve the following deficits and impairments:  Decreased range of motion, Pain, Impaired UE functional use, Decreased strength  Visit Diagnosis: Stiffness of right shoulder, not elsewhere classified  Acute pain of right shoulder  Muscle weakness (generalized)     Problem List There are no active problems to display for this patient.   RAMSEUR,CHRIS,PTA 11/06/2015, 3:31 PM  [facility] [address] [place], [place], [address] Phone: [phone]   Fax:  [phone]  Name: Scott Chapman MRN: [id number] Date of Birth: 10/05/1960

## 2015-11-10 ENCOUNTER — Ambulatory Visit: Payer: Worker's Compensation | Admitting: Physical Therapy

## 2015-11-10 DIAGNOSIS — M6281 Muscle weakness (generalized): Secondary | ICD-10-CM

## 2015-11-10 DIAGNOSIS — M25511 Pain in right shoulder: Secondary | ICD-10-CM

## 2015-11-10 DIAGNOSIS — M25611 Stiffness of right shoulder, not elsewhere classified: Secondary | ICD-10-CM

## 2015-11-10 NOTE — Therapy (Signed)
[facility] [address] [place], [place], [address] Phone: [phone]   Fax:  [phone]  Physical Therapy Treatment  Patient Details  Name: Scott CockayneJames M Chapman MRN: [id number] Date of Birth: 1960-12-28 Referring Provider: Marciano SequinBryson Stillwell PA-C  Encounter Date: 11/10/2015      PT End of Session - 11/10/15 1702    Visit Number 7   Number of Visits 18   Date for PT Re-Evaluation 12/23/15   PT Start Time 0230   PT Stop Time 0320   PT Time Calculation (min) 50 min   Activity Tolerance Patient tolerated treatment well   Behavior During Therapy [facility] for tasks assessed/performed      Past Medical History:  Diagnosis Date  . Allergy   . Hyperlipidemia   . Hypertension     Past Surgical History:  Procedure Laterality Date  . Colapsed lung Right   . HERNIA REPAIR    . left arm surgery    . TENDON REPAIR Right    arm    There were no vitals filed for this visit.      Subjective Assessment - 11/10/15 1702    Subjective I lifted a dog door with my right arm noy wearing my sling.  I hope I didn't hurt my shoulder.     Pain Score 2    Pain Location Shoulder   Pain Orientation Right   Pain Type Surgical pain   Pain Onset In the past 7 days                         [facility] Adult PT Treatment/Exercise - 11/10/15 0001      Electrical Stimulation   Electrical Stimulation Location RT shoulder.   Electrical Stimulation Action Pre-mod at 80-150 Hz (non-motoric) x 15 minutes.   Electrical Stimulation Goals Pain     Manual Therapy   Manual Therapy Passive ROM   Manual therapy comments PROM to right shoulder x 30 minutes within protocol guidelines.                  PT Short Term Goals - 11/05/15 1353      PT SHORT TERM GOAL #1   Title I with HEP   Time 4   Period Weeks   Status Achieved     PT SHORT TERM GOAL #2   Title PROM of R shoulder to Protocol limits   Time 4   Period Weeks   Status On-going            PT Long Term Goals - 10/28/15 1650      PT LONG TERM GOAL #1   Title R shoulder ROM WFL to perform ADLs   Time 8   Period Weeks   Status New     PT LONG TERM GOAL #2   Title R shoulder strength 5/5 to allow RTW   Time 8   Period Weeks   Status New     PT LONG TERM GOAL #3   Title Able to peform ADLS with pain 2/10  or less in R shoulder   Time 8   Period Weeks   Status New             Patient will benefit from skilled therapeutic intervention in order to improve the following deficits and impairments:  Decreased range of motion, Pain, Impaired UE functional use, Decreased strength  Visit Diagnosis: Stiffness of right shoulder, not elsewhere classified  Acute pain of  right shoulder  Muscle weakness (generalized)     Problem List There are no active problems to display for this patient.   Earlee Herald, [place] MPT 11/10/2015, 5:07 PM  [facility] [address] [place], [place], [address] Phone: [phone]   Fax:  [phone]  Name: Scott CockayneJames M Chapman MRN: [id number] Date of Birth: Nov 11, 1960

## 2015-11-12 ENCOUNTER — Ambulatory Visit: Payer: Worker's Compensation | Attending: Orthopedic Surgery | Admitting: Physical Therapy

## 2015-11-12 ENCOUNTER — Encounter: Payer: Self-pay | Admitting: Physical Therapy

## 2015-11-12 DIAGNOSIS — M25611 Stiffness of right shoulder, not elsewhere classified: Secondary | ICD-10-CM | POA: Diagnosis present

## 2015-11-12 DIAGNOSIS — M25511 Pain in right shoulder: Secondary | ICD-10-CM

## 2015-11-12 DIAGNOSIS — M6281 Muscle weakness (generalized): Secondary | ICD-10-CM | POA: Diagnosis present

## 2015-11-12 NOTE — Therapy (Signed)
[facility] [address] [place], [place], [address] Phone: [phone]   Fax:  [phone]  Physical Therapy Treatment  Patient Details  Name: Scott CockayneJames M Chapman MRN: [id number] Date of Birth: [birth year] Referring Provider: Marciano SequinBryson Stillwell PA-C  Encounter Date: 11/12/2015      PT End of Session - 11/12/15 1303    Visit Number 8   Number of Visits 18   Date for PT Re-Evaluation 12/23/15   PT Start Time 1303   PT Stop Time 1346   PT Time Calculation (min) 43 min   Activity Tolerance Patient tolerated treatment well   Behavior During Therapy [facility] for tasks assessed/performed      Past Medical History:  Diagnosis Date  . Allergy   . Hyperlipidemia   . Hypertension     Past Surgical History:  Procedure Laterality Date  . Colapsed lung Right   . HERNIA REPAIR    . left arm surgery    . TENDON REPAIR Right    arm    There were no vitals filed for this visit.      Subjective Assessment - 11/12/15 1303    Subjective Reports no current pain but reports pain is worse at night.   Pertinent History HTN, asthma   Currently in Pain? No/denies            [facility] PT Assessment - 11/12/15 0001      Assessment   Medical Diagnosis s/p R RCR   Onset Date/Surgical Date 10/14/15   Hand Dominance Right   Next MD Visit 11/19/15     Precautions   Precautions Shoulder   Type of Shoulder Precautions RCR see protocol   Precaution Comments no lifting, excessive ext, stretching or sudden movements                     [facility] Adult PT Treatment/Exercise - 11/12/15 0001      Modalities   Modalities Electrical Stimulation;Cryotherapy     Cryotherapy   Number Minutes Cryotherapy 15 Minutes   Cryotherapy Location Shoulder   Type of Cryotherapy Ice pack     Electrical Stimulation   Electrical Stimulation Location R shoulder   Electrical Stimulation Action Pre-Mod   Electrical Stimulation Parameters 80-150 z x15 min   Electrical Stimulation Goals Pain     Manual Therapy   Manual Therapy Passive ROM;Soft tissue mobilization   Soft tissue mobilization STW to R Tricep, deltoids to decrease tightness   Passive ROM PROM of R shoulder into flex/ER/IR with gentle holds at end range                  PT Short Term Goals - 11/05/15 1353      PT SHORT TERM GOAL #1   Title I with HEP   Time 4   Period Weeks   Status Achieved     PT SHORT TERM GOAL #2   Title PROM of R shoulder to Protocol limits   Time 4   Period Weeks   Status On-going           PT Long Term Goals - 10/28/15 1650      PT LONG TERM GOAL #1   Title R shoulder ROM WFL to perform ADLs   Time 8   Period Weeks   Status New     PT LONG TERM GOAL #2   Title R shoulder strength 5/5 to allow RTW   Time 8   Period Weeks  Status New     PT LONG TERM GOAL #3   Title Able to peform ADLS with pain 2/10  or less in R shoulder   Time 8   Period Weeks   Status New               Plan - 11/12/15 1335    Clinical Impression Statement Patient tolerated today's treatment fairly good as patient arrived with no R shoulder pain. Patient did report discomfort initially around R supraspinatus attachment region  but with slow PROM into flexion patient did not report discomfort again. Patient did report experiencing discomfort with end range PROM ER of R shoulder. Firm end feels and smooth arc of motion noted PROM of R shoulder. Patient presented in clinic with tightness of R Deltoids and Tricep region upon palpation. Normal modalities response noted following removal of the modalities today. Patient remains compliant with sling use and very cautious with R shoulder secondary to fear of reinjury.   Rehab Potential Excellent   Clinical Impairments Affecting Rehab Potential R biceps tendon repair 2015 current surgery 10/14/15 -3weeks as of 11/04/15   PT Frequency 3x / week   PT Duration 8 weeks   PT Treatment/Interventions ADLs/Self  Care Home Management;Electrical Stimulation;Cryotherapy;Ultrasound;Patient/family education;Neuromuscular re-education;Therapeutic exercise;Manual techniques;Scar mobilization;Passive range of motion;Vasopneumatic Device   PT Next Visit Plan PROM within protocol ranges 2 wks - 10/28/15 (flex 105/ER 20-40/IR 35-45 All in scapular plane); 4 wks - 11/11/15 (flex 120/ABD 110/ ER 45 deg) (MD. Thomasena Edisollins 11/19/15)   PT Home Exercise Plan supine ER/IR with cane, pendulums, elbow flex/ext   Consulted and Agree with Plan of Care Patient      Patient will benefit from skilled therapeutic intervention in order to improve the following deficits and impairments:  Decreased range of motion, Pain, Impaired UE functional use, Decreased strength  Visit Diagnosis: Stiffness of right shoulder, not elsewhere classified  Acute pain of right shoulder  Muscle weakness (generalized)     Problem List There are no active problems to display for this patient.   Evelene CroonKelsey M Chapman, PTA 11/12/2015, 1:58 PM  [facility] [address] [place], [place], [address] Phone: [phone]   Fax:  [phone]  Name: Scott Chapman MRN: [id number] Date of Birth: 1960/10/15

## 2015-11-13 ENCOUNTER — Ambulatory Visit: Payer: Worker's Compensation | Admitting: Physical Therapy

## 2015-11-13 ENCOUNTER — Encounter: Payer: Self-pay | Admitting: Physical Therapy

## 2015-11-13 DIAGNOSIS — M25611 Stiffness of right shoulder, not elsewhere classified: Secondary | ICD-10-CM | POA: Diagnosis not present

## 2015-11-13 DIAGNOSIS — M6281 Muscle weakness (generalized): Secondary | ICD-10-CM

## 2015-11-13 DIAGNOSIS — M25511 Pain in right shoulder: Secondary | ICD-10-CM

## 2015-11-13 NOTE — Therapy (Signed)
[facility] [address] [place], [place], [address] Phone: [phone]   Fax:  [phone]  Physical Therapy Treatment  Patient Details  Name: Scott CockayneJames M Chapman MRN: [id number] Date of Birth: June 12, 1960 Referring Provider: Marciano SequinBryson Stillwell PA-C  Encounter Date: 11/13/2015      PT End of Session - 11/13/15 1430    Visit Number 9   Number of Visits 18   Date for PT Re-Evaluation 12/23/15   PT Start Time 1431   PT Stop Time 1512   PT Time Calculation (min) 41 min   Activity Tolerance Patient tolerated treatment well   Behavior During Therapy [facility] for tasks assessed/performed      Past Medical History:  Diagnosis Date  . Allergy   . Hyperlipidemia   . Hypertension     Past Surgical History:  Procedure Laterality Date  . Colapsed lung Right   . HERNIA REPAIR    . left arm surgery    . TENDON REPAIR Right    arm    There were no vitals filed for this visit.      Subjective Assessment - 11/13/15 1430    Subjective Reports that he still has pain at night but today his shoulder feels good. Reports that he backed his zero turn mower today using only his LUE as RUE was in sling. Reports that he has to keep sling on as he will use the RUE when he isn't supposed to.   Pertinent History HTN, asthma   Currently in Pain? No/denies            [facility] - 11/13/15 0001      Assessment   Medical Diagnosis s/p R RCR   Onset Date/Surgical Date 10/14/15   Hand Dominance Right   Next MD Visit 11/19/15     Precautions   Precautions Shoulder   Type of Shoulder Precautions RCR see protocol   Precaution Comments no lifting, excessive ext, stretching or sudden movements                     [facility] Adult PT Treatment/Exercise - 11/13/15 0001      Modalities   Modalities Electrical Stimulation;Cryotherapy     Cryotherapy   Number Minutes Cryotherapy 15 Minutes   Cryotherapy Location Shoulder   Type of  Cryotherapy Ice pack     Electrical Stimulation   Electrical Stimulation Location R shoulder   Electrical Stimulation Action Pre-Mod   Electrical Stimulation Parameters 80-150 hz x15 min   Electrical Stimulation Goals Pain     Manual Therapy   Manual Therapy Passive ROM;Soft tissue mobilization   Soft tissue mobilization STW to R Tricep, deltoids to decrease tightness   Passive ROM PROM of R shoulder into flex/ER/IR with gentle holds at end range                  PT Short Term Goals - 11/05/15 1353      PT SHORT TERM GOAL #1   Title I with HEP   Time 4   Period Weeks   Status Achieved     PT SHORT TERM GOAL #2   Title PROM of R shoulder to Protocol limits   Time 4   Period Weeks   Status On-going           PT Long Term Goals - 10/28/15 1650      PT LONG TERM GOAL #1   Title R shoulder ROM WFL to perform ADLs  Time 8   Period Weeks   Status New     PT LONG TERM GOAL #2   Title R shoulder strength 5/5 to allow RTW   Time 8   Period Weeks   Status New     PT LONG TERM GOAL #3   Title Able to peform ADLS with pain 2/10  or less in R shoulder   Time 8   Period Weeks   Status New               Plan - 11/13/15 1507    Clinical Impression Statement Patient tolerated today's treatment fairly well although he continues to report R superioposterior shoulder discomfort with PROM of R shoulder into flexion. TIghtness continues to be noted in R Deltoids and Triceps region today upon palpation. No discomfort reported with PROM of R shoulder into ER. Firm end feels noted with all directions of PROM of R shoulder assessed today. Normal modalities response noted following removal of the modalities. Patient remains anxious regarding healing timeline and complying with sling use so that he is not doing more than he is supposed to.   Rehab Potential Excellent   Clinical Impairments Affecting Rehab Potential R biceps tendon repair 2015 current surgery 10/14/15  -3weeks as of 11/04/15   PT Frequency 3x / week   PT Duration 8 weeks   PT Treatment/Interventions ADLs/Self Care Home Management;Electrical Stimulation;Cryotherapy;Ultrasound;Patient/family education;Neuromuscular re-education;Therapeutic exercise;Manual techniques;Scar mobilization;Passive range of motion;Vasopneumatic Device   PT Next Visit Plan PROM within protocol ranges 2 wks - 10/28/15 (flex 105/ER 20-40/IR 35-45 All in scapular plane); 4 wks - 11/11/15 (flex 120/ABD 110/ ER 45 deg) (MD. Thomasena Edisollins 11/19/15)   PT Home Exercise Plan supine ER/IR with cane, pendulums, elbow flex/ext   Consulted and Agree with Plan of Care Patient      Patient will benefit from skilled therapeutic intervention in order to improve the following deficits and impairments:  Decreased range of motion, Pain, Impaired UE functional use, Decreased strength  Visit Diagnosis: Stiffness of right shoulder, not elsewhere classified  Acute pain of right shoulder  Muscle weakness (generalized)     Problem List There are no active problems to display for this patient.   Evelene CroonKelsey M Parsons, PTA 11/13/2015, 3:19 PM  [facility] [address] [place], [place], [address] Phone: [phone]   Fax:  [phone]  Name: Scott CockayneJames M Chapman MRN: [id number] Date of Birth: 08/30/1960

## 2015-11-17 ENCOUNTER — Encounter: Payer: Self-pay | Admitting: Physical Therapy

## 2015-11-17 ENCOUNTER — Ambulatory Visit: Payer: Worker's Compensation | Admitting: Physical Therapy

## 2015-11-17 DIAGNOSIS — M25511 Pain in right shoulder: Secondary | ICD-10-CM

## 2015-11-17 DIAGNOSIS — M25611 Stiffness of right shoulder, not elsewhere classified: Secondary | ICD-10-CM | POA: Diagnosis not present

## 2015-11-17 DIAGNOSIS — M6281 Muscle weakness (generalized): Secondary | ICD-10-CM

## 2015-11-17 NOTE — Therapy (Signed)
[facility] [address] [place], [place], [address] Phone: [phone]   Fax:  [phone]  Physical Therapy Treatment  Patient Details  Name: Scott CockayneJames M Chapman MRN: [id number] Date of Birth: 28-Sep-1960 Referring Provider: Marciano SequinBryson Stillwell PA-C  Encounter Date: 11/17/2015      PT End of Session - 11/17/15 1339    Visit Number 10   Number of Visits 18   Date for PT Re-Evaluation 12/23/15   PT Start Time 1316   PT Stop Time 1359   PT Time Calculation (min) 43 min   Activity Tolerance Patient tolerated treatment well   Behavior During Therapy [facility] for tasks assessed/performed      Past Medical History:  Diagnosis Date  . Allergy   . Hyperlipidemia   . Hypertension     Past Surgical History:  Procedure Laterality Date  . Colapsed lung Right   . HERNIA REPAIR    . left arm surgery    . TENDON REPAIR Right    arm    There were no vitals filed for this visit.      Subjective Assessment - 11/17/15 1322    Subjective Patient reported progress overall yet some ongoing pain in shoulder and bicep area    Pertinent History HTN, asthma   Currently in Pain? Yes   Pain Score 4    Pain Location Shoulder   Pain Orientation Right   Pain Descriptors / Indicators Aching   Pain Type Surgical pain   Pain Onset In the past 7 days   Pain Frequency Intermittent   Aggravating Factors  at night    Pain Relieving Factors rest and meds            [facility] PT Assessment - 11/17/15 0001      PROM   PROM Assessment Site Shoulder   Right Shoulder Flexion 120 Degrees   Right Shoulder External Rotation 44 Degrees                     [facility] Adult PT Treatment/Exercise - 11/17/15 0001      Cryotherapy   Number Minutes Cryotherapy 15 Minutes   Cryotherapy Location Shoulder   Type of Cryotherapy Ice pack     Electrical Stimulation   Electrical Stimulation Location R shoulder   Electrical Stimulation Action premod   Electrical Stimulation Parameters 80-150hz  x4815min   Electrical Stimulation Goals Pain     Manual Therapy   Manual Therapy Passive ROM   Passive ROM PROM of R shoulder into flex/ER/IR with gentle holds at end range                  PT Short Term Goals - 11/05/15 1353      PT SHORT TERM GOAL #1   Title I with HEP   Time 4   Period Weeks   Status Achieved     PT SHORT TERM GOAL #2   Title PROM of R shoulder to Protocol limits   Time 4   Period Weeks   Status On-going           PT Long Term Goals - 10/28/15 1650      PT LONG TERM GOAL #1   Title R shoulder ROM WFL to perform ADLs   Time 8   Period Weeks   Status New     PT LONG TERM GOAL #2   Title R shoulder strength 5/5 to allow RTW   Time 8   Period Weeks  Status New     PT LONG TERM GOAL #3   Title Able to peform ADLS with pain 2/10  or less in R shoulder   Time 8   Period Weeks   Status New               Plan - 11/17/15 1351    Clinical Impression Statement Patient continues to progress overall with little pain and soreness and ROM is within protocol limitations currently. Patient has reported some minor new soreness in bicep area for unkown reason. Patient has minimal guarding or tightness in right shoulder. Patient current goals ongoing due to healing/protocol deficts.   Rehab Potential Excellent   Clinical Impairments Affecting Rehab Potential R biceps tendon repair 2015 current surgery 10/14/15 - 5 weeks 11/18/15   PT Frequency 3x / week   PT Duration 8 weeks   PT Treatment/Interventions ADLs/Self Care Home Management;Electrical Stimulation;Cryotherapy;Ultrasound;Patient/family education;Neuromuscular re-education;Therapeutic exercise;Manual techniques;Scar mobilization;Passive range of motion;Vasopneumatic Device   PT Next Visit Plan PROM within protocol ranges 2 wks - 10/28/15 (flex 105/ER 20-40/IR 35-45 All in scapular plane); 4 wks - 11/11/15 (flex 120/ABD 110/ ER 45 deg) (MD. Thomasena Edisollins  11/19/15)Note tomorrow   Consulted and Agree with Plan of Care Patient      Patient will benefit from skilled therapeutic intervention in order to improve the following deficits and impairments:  Decreased range of motion, Pain, Impaired UE functional use, Decreased strength  Visit Diagnosis: Stiffness of right shoulder, not elsewhere classified  Acute pain of right shoulder  Muscle weakness (generalized)     Problem List There are no active problems to display for this patient.   [place], [place], PTA 11/17/2015, 2:53 PM  Patient is progressing per protocol guidelines.  Italyhad Applegate MPT  [facility] [address] [place], [place], [address] Phone: [phone]   Fax:  [phone]  Name: Scott CockayneJames M Chapman MRN: [id number] Date of Birth: [birth year]

## 2015-11-18 ENCOUNTER — Ambulatory Visit: Payer: Worker's Compensation | Admitting: Physical Therapy

## 2015-11-18 DIAGNOSIS — M25611 Stiffness of right shoulder, not elsewhere classified: Secondary | ICD-10-CM

## 2015-11-18 DIAGNOSIS — M25511 Pain in right shoulder: Secondary | ICD-10-CM

## 2015-11-18 DIAGNOSIS — M6281 Muscle weakness (generalized): Secondary | ICD-10-CM

## 2015-11-18 NOTE — Therapy (Signed)
[facility] [address] [place], [place], [address] Phone: [phone]   Fax:  [phone]  Physical Therapy Treatment  Patient Details  Name: Scott Chapman MRN: [id number] Date of Birth: 1960-03-27 Referring Provider: Marciano SequinBryson Stillwell PA-C  Encounter Date: 11/18/2015      PT End of Session - 11/18/15 1345    Visit Number 11   Number of Visits 18   Date for PT Re-Evaluation 12/23/15   PT Start Time 0102   PT Stop Time 0154   PT Time Calculation (min) 52 min   Activity Tolerance Patient tolerated treatment well   Behavior During Therapy [facility] for tasks assessed/performed      Past Medical History:  Diagnosis Date  . Allergy   . Hyperlipidemia   . Hypertension     Past Surgical History:  Procedure Laterality Date  . Colapsed lung Right   . HERNIA REPAIR    . left arm surgery    . TENDON REPAIR Right    arm    There were no vitals filed for this visit.      Subjective Assessment - 11/18/15 1339    Subjective Patient pleased with his progress thus far.     Pain Score 4    Pain Location Shoulder   Pain Orientation Right   Pain Descriptors / Indicators Aching   Pain Onset In the past 7 days                         [facility] Adult PT Treatment/Exercise - 11/18/15 0001      Modalities   Modalities Electrical Stimulation;Moist Heat     Moist Heat Therapy   Number Minutes Moist Heat 15 Minutes   Moist Heat Location --  RT shoulder.     Programme researcher, broadcasting/film/videolectrical Stimulation   Electrical Stimulation Location --  RT SHOULDER.   Electrical Stimulation Action PRE-MOD   Electrical Stimulation Parameters 80-150 HZ x 15 minutes.     Manual Therapy   Manual Therapy Passive ROM   Passive ROM In supine left shoulder PROM  25 minutes into flexion and ER per protocol guidelines.                  PT Short Term Goals - 11/05/15 1353      PT SHORT TERM GOAL #1   Title I with HEP   Time 4   Period Weeks   Status Achieved     PT SHORT TERM GOAL #2   Title PROM of R shoulder to Protocol limits   Time 4   Period Weeks   Status On-going           PT Long Term Goals - 10/28/15 1650      PT LONG TERM GOAL #1   Title R shoulder ROM WFL to perform ADLs   Time 8   Period Weeks   Status New     PT LONG TERM GOAL #2   Title R shoulder strength 5/5 to allow RTW   Time 8   Period Weeks   Status New     PT LONG TERM GOAL #3   Title Able to peform ADLS with pain 2/10  or less in R shoulder   Time 8   Period Weeks   Status New               Plan - 11/18/15 1428    Clinical Impression Statement Patient is progressing very well and  is on target per protocol range of motion guidelines.  Plan to begin AAROM.      Patient will benefit from skilled therapeutic intervention in order to improve the following deficits and impairments:  Decreased range of motion, Pain, Impaired UE functional use, Decreased strength  Visit Diagnosis: Stiffness of right shoulder, not elsewhere classified  Acute pain of right shoulder  Muscle weakness (generalized)     Problem List There are no active problems to display for this patient.   Luceal Hollibaugh, [place] MPT 11/18/2015, 2:29 PM  [facility] [address] [place], [place], [address] Phone: [phone]   Fax:  [phone]  Name: Scott CockayneJames M Chapman MRN: [id number] Date of Birth: [birth year]

## 2015-11-20 ENCOUNTER — Ambulatory Visit: Payer: Worker's Compensation | Admitting: Physical Therapy

## 2015-11-20 ENCOUNTER — Encounter: Payer: Self-pay | Admitting: Physical Therapy

## 2015-11-20 DIAGNOSIS — M25511 Pain in right shoulder: Secondary | ICD-10-CM

## 2015-11-20 DIAGNOSIS — M6281 Muscle weakness (generalized): Secondary | ICD-10-CM

## 2015-11-20 DIAGNOSIS — M25611 Stiffness of right shoulder, not elsewhere classified: Secondary | ICD-10-CM

## 2015-11-20 NOTE — Therapy (Signed)
[facility] [address] [place], [place], [address] Phone: [phone]   Fax:  [phone]  Physical Therapy Treatment  Patient Details  Name: Scott CockayneJames M Esqueda MRN: [id number] Date of Birth: [birth year] Referring Provider: Marciano SequinBryson Stillwell PA-C  Encounter Date: 11/20/2015      PT End of Session - 11/20/15 1433    Visit Number 12   Number of Visits 30  per NO provided 11/20/2015 for 2-3x week for 4 weeks   Date for PT Re-Evaluation 12/23/15   PT Start Time 1434   PT Stop Time 1518   PT Time Calculation (min) 44 min   Activity Tolerance Patient tolerated treatment well   Behavior During Therapy [facility] for tasks assessed/performed      Past Medical History:  Diagnosis Date  . Allergy   . Hyperlipidemia   . Hypertension     Past Surgical History:  Procedure Laterality Date  . Colapsed lung Right   . HERNIA REPAIR    . left arm surgery    . TENDON REPAIR Right    arm    There were no vitals filed for this visit.      Subjective Assessment - 11/20/15 1433    Subjective Reports that MD said he had a massive tear and that next week he could start weaning from sling.   Pertinent History HTN, asthma   Currently in Pain? No/denies            [facility] - 11/20/15 0001      Assessment   Medical Diagnosis s/p R RCR   Onset Date/Surgical Date 10/14/15   Hand Dominance Right   Next MD Visit 12/24/2015     Precautions   Precautions Shoulder   Type of Shoulder Precautions RCR see protocol   Precaution Comments no lifting, excessive ext, stretching or sudden movements                     [facility] Adult PT Treatment/Exercise - 11/20/15 0001      Modalities   Modalities Electrical Stimulation;Moist Heat     Moist Heat Therapy   Number Minutes Moist Heat 15 Minutes   Moist Heat Location Shoulder     Electrical Stimulation   Electrical Stimulation Location R shoulder/Bicep   Electrical Stimulation  Action Pre-Mod   Electrical Stimulation Parameters 80-150 hz x15 min   Electrical Stimulation Goals Pain     Manual Therapy   Manual Therapy Passive ROM   Passive ROM PROM of R shoulder into flex/ER/IR with gentle holds at end range                  PT Short Term Goals - 11/05/15 1353      PT SHORT TERM GOAL #1   Title I with HEP   Time 4   Period Weeks   Status Achieved     PT SHORT TERM GOAL #2   Title PROM of R shoulder to Protocol limits   Time 4   Period Weeks   Status On-going           PT Long Term Goals - 10/28/15 1650      PT LONG TERM GOAL #1   Title R shoulder ROM WFL to perform ADLs   Time 8   Period Weeks   Status New     PT LONG TERM GOAL #2   Title R shoulder strength 5/5 to allow RTW   Time 8   Period  Weeks   Status New     PT LONG TERM GOAL #3   Title Able to peform ADLS with pain 2/10  or less in R shoulder   Time 8   Period Weeks   Status New               Plan - 11/20/15 1509    Clinical Impression Statement Patient continues to progress well following R shoulder RCR and arrived with no R shoulder pain/discomfort. Firm end feels noted with PROM into all directions and smooth arc of motion noted as well. Patient experienced only intermittant catching into PROM flexion. Patient had no catching with PROM ER/IR per patient report. Normal modalities response noted following removal of the modalities.   Rehab Potential Excellent   Clinical Impairments Affecting Rehab Potential R biceps tendon repair 2015 current surgery 10/14/15 - 5 weeks 11/18/15   PT Frequency 3x / week   PT Duration 8 weeks   PT Treatment/Interventions ADLs/Self Care Home Management;Electrical Stimulation;Cryotherapy;Ultrasound;Neuromuscular re-education;Therapeutic exercise;Manual techniques;Scar mobilization;Passive range of motion;Vasopneumatic Device;Patient/family education   PT Next Visit Plan [facility] to [facility] exercises and modalities next treatment per  MPT POC. Patient will be 6 weeks post-surgery 11/25/2015.   PT Home Exercise Plan supine ER/IR with cane, pendulums, elbow flex/ext   Consulted and Agree with Plan of Care Patient      Patient will benefit from skilled therapeutic intervention in order to improve the following deficits and impairments:  Decreased range of motion, Pain, Impaired UE functional use, Decreased strength  Visit Diagnosis: Stiffness of right shoulder, not elsewhere classified  Acute pain of right shoulder  Muscle weakness (generalized)     Problem List There are no active problems to display for this patient.   Evelene CroonKelsey M Parsons, PTA 11/20/2015, 3:26 PM  [facility] [address] [place], [place], [address] Phone: [phone]   Fax:  [phone]  Name: Scott CockayneJames M Eshbach MRN: [id number] Date of Birth: [birth year]

## 2015-11-25 ENCOUNTER — Ambulatory Visit: Payer: Worker's Compensation | Admitting: Physical Therapy

## 2015-11-25 DIAGNOSIS — M25611 Stiffness of right shoulder, not elsewhere classified: Secondary | ICD-10-CM | POA: Diagnosis not present

## 2015-11-25 DIAGNOSIS — M6281 Muscle weakness (generalized): Secondary | ICD-10-CM

## 2015-11-25 DIAGNOSIS — M25511 Pain in right shoulder: Secondary | ICD-10-CM

## 2015-11-25 NOTE — Therapy (Signed)
[facility] [address] [place], [place], [address] Phone: [phone]   Fax:  [phone]  Physical Therapy Treatment  Patient Details  Name: Scott CockayneJames M Chapman MRN: [id number] Date of Birth: 28-May-1960 Referring Provider: Marciano SequinBryson Stillwell PA-C  Encounter Date: 11/25/2015      PT End of Session - 11/25/15 1300    Visit Number 13   Number of Visits 30   Date for PT Re-Evaluation 12/23/15   PT Start Time 1118   PT Stop Time 1226   PT Time Calculation (min) 68 min   Activity Tolerance Patient tolerated treatment well   Behavior During Therapy [facility] for tasks assessed/performed      Past Medical History:  Diagnosis Date  . Allergy   . Hyperlipidemia   . Hypertension     Past Surgical History:  Procedure Laterality Date  . Colapsed lung Right   . HERNIA REPAIR    . left arm surgery    . TENDON REPAIR Right    arm    There were no vitals filed for this visit.      Subjective Assessment - 11/25/15 1245    Subjective The doctor gave me a new order to progess.  He also said that if this surgery doesn't work he may have to put a graft in.   Pain Score 4    Pain Location Shoulder   Pain Orientation Right   Pain Descriptors / Indicators Aching   Pain Type Surgical pain   Pain Onset In the past 7 days   Pain Frequency Intermittent                         [facility] Adult PT Treatment/Exercise - 11/25/15 0001      Exercises   Exercises Shoulder     Shoulder Exercises: Supine   Other Supine Exercises Supine cane exercise into bench press and flexion (total:  8 minutes).     Shoulder Exercises: Seated   Other Seated Exercises Seated UE Ranger x 5 minutes.     Shoulder Exercises: Standing   Other Standing Exercises Wall ladder x 5 minutes.     Shoulder Exercises: Pulleys   Flexion Limitations 5 minutes.     Moist Heat Therapy   Number Minutes Moist Heat 20 Minutes   Moist Heat Location --  RT SHLD.     Programme researcher, broadcasting/film/videolectrical Stimulation   Electrical Stimulation Location --  RT SHLD.   Electrical Stimulation Action Pre-mod.   Electrical Stimulation Parameters 80-150 HZ x 20 minutes.     Manual Therapy   Manual Therapy Passive ROM   Passive ROM PROM into right shoulder flexion including gentle low load long duration stretching x 12 minutes.                  PT Short Term Goals - 11/05/15 1353      PT SHORT TERM GOAL #1   Title I with HEP   Time 4   Period Weeks   Status Achieved     PT SHORT TERM GOAL #2   Title PROM of R shoulder to Protocol limits   Time 4   Period Weeks   Status On-going           PT Long Term Goals - 10/28/15 1650      PT LONG TERM GOAL #1   Title R shoulder ROM WFL to perform ADLs   Time 8   Period Weeks   Status  New     PT LONG TERM GOAL #2   Title R shoulder strength 5/5 to allow RTW   Time 8   Period Weeks   Status New     PT LONG TERM GOAL #3   Title Able to peform ADLS with pain 2/10  or less in R shoulder   Time 8   Period Weeks   Status New             Patient will benefit from skilled therapeutic intervention in order to improve the following deficits and impairments:  Decreased range of motion, Pain, Impaired UE functional use, Decreased strength  Visit Diagnosis: Stiffness of right shoulder, not elsewhere classified  Acute pain of right shoulder  Muscle weakness (generalized)     Problem List There are no active problems to display for this patient.   [place], [place] MPT 11/25/2015, 1:05 PM  [facility] [address] [place], [place], [address] Phone: [phone]   Fax:  [phone]  Name: Scott CockayneJames M Chapman MRN: [id number] Date of Birth: [birth year]

## 2015-11-26 ENCOUNTER — Encounter: Payer: Self-pay | Admitting: Physical Therapy

## 2015-11-26 ENCOUNTER — Ambulatory Visit: Payer: Worker's Compensation | Admitting: Physical Therapy

## 2015-11-26 DIAGNOSIS — M25611 Stiffness of right shoulder, not elsewhere classified: Secondary | ICD-10-CM

## 2015-11-26 DIAGNOSIS — M25511 Pain in right shoulder: Secondary | ICD-10-CM

## 2015-11-26 DIAGNOSIS — M6281 Muscle weakness (generalized): Secondary | ICD-10-CM

## 2015-11-26 NOTE — Therapy (Signed)
[facility] [address] [place], [place], [address] Phone: [phone]   Fax:  [phone]  Physical Therapy Treatment  Patient Details  Name: Scott CockayneJames M Olazabal MRN: [id number] Date of Birth: 11/09/1960 Referring Provider: Marciano SequinBryson Stillwell PA-C  Encounter Date: 11/26/2015      PT End of Session - 11/26/15 1345    Visit Number 14   Number of Visits 30   Date for PT Re-Evaluation 12/23/15   PT Start Time 1347   PT Stop Time 1444   PT Time Calculation (min) 57 min   Activity Tolerance Patient tolerated treatment well   Behavior During Therapy [facility] for tasks assessed/performed      Past Medical History:  Diagnosis Date  . Allergy   . Hyperlipidemia   . Hypertension     Past Surgical History:  Procedure Laterality Date  . Colapsed lung Right   . HERNIA REPAIR    . left arm surgery    . TENDON REPAIR Right    arm    There were no vitals filed for this visit.      Subjective Assessment - 11/26/15 1345    Subjective Reports soreness around top of R shoulder. Reports that shoulder feels locked.   Pertinent History HTN, asthma   Currently in Pain? Yes   Pain Score 6    Pain Location Shoulder   Pain Orientation Right   Pain Descriptors / Indicators Sore   Pain Type Surgical pain   Pain Onset In the past 7 days            [facility] - 11/26/15 0001      Assessment   Medical Diagnosis s/p R RCR   Onset Date/Surgical Date 10/14/15   Hand Dominance Right   Next MD Visit 12/24/2015     Precautions   Precautions Shoulder   Type of Shoulder Precautions RCR see protocol   Precaution Comments no lifting, excessive ext, stretching or sudden movements                     [facility] Adult PT Treatment/Exercise - 11/26/15 0001      Shoulder Exercises: Supine   Protraction AAROM;Both;20 reps   External Rotation AAROM;Right;20 reps   Flexion AAROM;Both;20 reps     Shoulder Exercises: Standing   Other  Standing Exercises Wall ladder x 5 minutes.     Shoulder Exercises: Pulleys   Flexion Other (comment)  x5 min   Other Pulley Exercises RUE ranger flex/CW and CCW circles x20 reps     Modalities   Modalities Electrical Stimulation;Moist Heat     Moist Heat Therapy   Number Minutes Moist Heat 15 Minutes   Moist Heat Location Shoulder     Electrical Stimulation   Electrical Stimulation Location R Deltoids   Electrical Stimulation Action Pre-Mod   Electrical Stimulation Parameters 80-150 hz x15 min   Electrical Stimulation Goals Pain     Manual Therapy   Manual Therapy Soft tissue mobilization   Soft tissue mobilization STW to R Deltoids, Tricep to decrease tightness and discomfort                PT Education - 11/26/15 1441    Education provided Yes   Education Details HEP- AAROM supine flexion and ER, table slides into flexion   Person(s) Educated Patient   Methods Explanation;Demonstration;Verbal cues;Handout   Comprehension Verbalized understanding;Returned demonstration;Verbal cues required          PT Short Term  Goals - 11/05/15 1353      PT SHORT TERM GOAL #1   Title I with HEP   Time 4   Period Weeks   Status Achieved     PT SHORT TERM GOAL #2   Title PROM of R shoulder to Protocol limits   Time 4   Period Weeks   Status On-going           PT Long Term Goals - 10/28/15 1650      PT LONG TERM GOAL #1   Title R shoulder ROM WFL to perform ADLs   Time 8   Period Weeks   Status New     PT LONG TERM GOAL #2   Title R shoulder strength 5/5 to allow RTW   Time 8   Period Weeks   Status New     PT LONG TERM GOAL #3   Title Able to peform ADLS with pain 2/10  or less in R shoulder   Time 8   Period Weeks   Status New               Plan - 11/26/15 1428    Clinical Impression Statement Patient arrived to treatment with R shoulder soreness as he has just progressed to AAROM exercises. Patient able to tolerate AAROM exercises well  although tightness and soreness present around R Deltoids. Patient educated to not overdue exercises by allowing pain to be his assessment tool. Tightness was palpated throughout R Triceps and Deltoids during manual therapy today. Normal modalities response noted following removal of the modalities. Patient accepted new AAROM exercises with education regarding technique and parameters. Patient had no questions regarding HEP education.   Rehab Potential Excellent   Clinical Impairments Affecting Rehab Potential R biceps tendon repair 2015 current surgery 10/14/15    PT Frequency 3x / week   PT Duration 8 weeks   PT Treatment/Interventions ADLs/Self Care Home Management;Electrical Stimulation;Cryotherapy;Ultrasound;Neuromuscular re-education;Therapeutic exercise;Manual techniques;Scar mobilization;Passive range of motion;Vasopneumatic Device;Patient/family education   PT Next Visit Plan [facility] and modalities next treatment per MPT POC.    PT Home Exercise Plan supine ER/IR with cane, pendulums, elbow flex/ext; AAROM flexion, table slides, AAROM ER   Consulted and Agree with Plan of Care Patient      Patient will benefit from skilled therapeutic intervention in order to improve the following deficits and impairments:  Decreased range of motion, Pain, Impaired UE functional use, Decreased strength  Visit Diagnosis: Stiffness of right shoulder, not elsewhere classified  Acute pain of right shoulder  Muscle weakness (generalized)     Problem List There are no active problems to display for this patient.   Evelene CroonKelsey M Parsons, PTA 11/26/2015, 2:54 PM  [facility] [address] [place], [place], [address] Phone: [phone]   Fax:  [phone]  Name: Scott CockayneJames M Nadeau MRN: [id number] Date of Birth: March 16, 1960

## 2015-11-26 NOTE — Patient Instructions (Addendum)
ROM: Flexion - Wand    Lay on your back with cane or pipe in both hands. Lift cane up and go back overhead but go to point of discomfort not past it. Hold __5__ seconds. Repeat __10__ times per set. Do __2__ sets per session. Do __3-4__ sessions per day.  http://orth.exer.us/744   Copyright  VHI. All rights reserved.  ROM: Flexion    Keeping right arm on table, slide body away until stretch is felt. Hold __5__ seconds. Repeat __10__ times per set. Do __2__ sets per session. Do __3-4__ sessions per day.  http://orth.exer.us/756   Copyright  VHI. All rights reserved.  ROM: External / Internal Rotation - Wand    Laying on your back, hold the end of the cane or pipe with your right hand. Use left hand to push your right hand out. Keep your right elbow by your side. Keep both elbows bent. When stretch is felt, hold _5___ seconds.  Repeat __10__ times per set. Do __2__ sets per session. Do _3-4___ sessions per day.  http://orth.exer.us/748   Copyright  VHI. All rights reserved.

## 2015-12-01 ENCOUNTER — Encounter: Payer: Self-pay | Admitting: Physical Therapy

## 2015-12-01 ENCOUNTER — Ambulatory Visit: Payer: Worker's Compensation | Admitting: Physical Therapy

## 2015-12-01 DIAGNOSIS — M25511 Pain in right shoulder: Secondary | ICD-10-CM

## 2015-12-01 DIAGNOSIS — M6281 Muscle weakness (generalized): Secondary | ICD-10-CM

## 2015-12-01 DIAGNOSIS — M25611 Stiffness of right shoulder, not elsewhere classified: Secondary | ICD-10-CM

## 2015-12-01 NOTE — Therapy (Signed)
[facility] [address] [place], [place], [address] Phone: [phone]   Fax:  [phone]  Physical Therapy Treatment  Patient Details  Name: Scott CockayneJames M Chapman MRN: [id number] Date of Birth: [birth year] Referring Provider: Marciano SequinBryson Stillwell PA-C  Encounter Date: 12/01/2015      PT End of Session - 12/01/15 1258    Visit Number 15   Number of Visits 30   Date for PT Re-Evaluation 12/23/15   PT Start Time 1300   PT Stop Time 1354   PT Time Calculation (min) 54 min   Activity Tolerance Patient tolerated treatment well   Behavior During Therapy [facility] for tasks assessed/performed      Past Medical History:  Diagnosis Date  . Allergy   . Hyperlipidemia   . Hypertension     Past Surgical History:  Procedure Laterality Date  . Colapsed lung Right   . HERNIA REPAIR    . left arm surgery    . TENDON REPAIR Right    arm    There were no vitals filed for this visit.      Subjective Assessment - 12/01/15 1258    Subjective Reports that he got his wife to take him to the mall and states that he was out of his sling. Reports that once he got home he had to rest to get his shoulder to feel better.   Pertinent History HTN, asthma   Currently in Pain? Yes   Pain Score 3    Pain Location Shoulder   Pain Orientation Right   Pain Type Surgical pain   Pain Onset In the past 7 days            [facility] - 12/01/15 0001      Assessment   Medical Diagnosis s/p R RCR   Onset Date/Surgical Date 10/14/15   Hand Dominance Right   Next MD Visit 12/24/2015     Precautions   Precautions Shoulder   Type of Shoulder Precautions RCR see protocol   Precaution Comments no lifting, excessive ext, stretching or sudden movements                     [facility] Adult PT Treatment/Exercise - 12/01/15 0001      Shoulder Exercises: Supine   Protraction AAROM;Both;20 reps   External Rotation AAROM;Right;20 reps   Flexion  AAROM;Both;20 reps     Shoulder Exercises: Standing   Other Standing Exercises RUE table ABCs with 2# ball x1 rep     Shoulder Exercises: Pulleys   Flexion Other (comment)  x5 min   Other Pulley Exercises RUE ranger flex/CW and CCW circles x30 reps     Modalities   Modalities Electrical Stimulation;Moist Heat     Moist Heat Therapy   Number Minutes Moist Heat 15 Minutes   Moist Heat Location Shoulder     Electrical Stimulation   Electrical Stimulation Location R Deltoids   Electrical Stimulation Action Pre-Mod   Electrical Stimulation Parameters 80-150 hz x15 min   Electrical Stimulation Goals Pain     Manual Therapy   Manual Therapy Passive ROM   Passive ROM PROM of R shoulder into flex/ER with gentle holds at end range                  PT Short Term Goals - 11/05/15 1353      PT SHORT TERM GOAL #1   Title I with HEP   Time 4   Period Weeks  Status Achieved     PT SHORT TERM GOAL #2   Title PROM of R shoulder to Protocol limits   Time 4   Period Weeks   Status On-going           PT Long Term Goals - 10/28/15 1650      PT LONG TERM GOAL #1   Title R shoulder ROM WFL to perform ADLs   Time 8   Period Weeks   Status New     PT LONG TERM GOAL #2   Title R shoulder strength 5/5 to allow RTW   Time 8   Period Weeks   Status New     PT LONG TERM GOAL #3   Title Able to peform ADLS with pain 2/10  or less in R shoulder   Time 8   Period Weeks   Status New               Plan - 12/01/15 1341    Clinical Impression Statement Patient arrived to treatment with only low level R shoulder today. Patient able to complete exercises as directed with only reports of discomfort coming with AAROM protraction in posterior R shoulder. Firm end feels noted with PROM of R shoulder into flexion and ER with smooth arc of motion noted as well. Normal modalities response noted following removal of the modalities. Patient continues to be compliant with  regular sling use that he has had since previous visit to MD.   Rehab Potential Excellent   Clinical Impairments Affecting Rehab Potential R biceps tendon repair 2015 current surgery 10/14/15    PT Frequency 3x / week   PT Duration 8 weeks   PT Treatment/Interventions ADLs/Self Care Home Management;Electrical Stimulation;Cryotherapy;Ultrasound;Neuromuscular re-education;Therapeutic exercise;Manual techniques;Scar mobilization;Passive range of motion;Vasopneumatic Device;Patient/family education   PT Next Visit Plan [facility] and modalities next treatment per MPT POC.    PT Home Exercise Plan supine ER/IR with cane, pendulums, elbow flex/ext; AAROM flexion, table slides, AAROM ER   Consulted and Agree with Plan of Care Patient      Patient will benefit from skilled therapeutic intervention in order to improve the following deficits and impairments:  Decreased range of motion, Pain, Impaired UE functional use, Decreased strength  Visit Diagnosis: Stiffness of right shoulder, not elsewhere classified  Acute pain of right shoulder  Muscle weakness (generalized)     Problem List There are no active problems to display for this patient.   Evelene CroonKelsey M Parsons, PTA 12/01/2015, 2:02 PM  [facility] [address] [place], [place], [address] Phone: [phone]   Fax:  [phone]  Name: Scott CockayneJames M Chapman MRN: [id number] Date of Birth: 10/19/1960

## 2015-12-02 ENCOUNTER — Ambulatory Visit: Payer: Worker's Compensation | Admitting: Physical Therapy

## 2015-12-02 DIAGNOSIS — M25611 Stiffness of right shoulder, not elsewhere classified: Secondary | ICD-10-CM

## 2015-12-02 DIAGNOSIS — M6281 Muscle weakness (generalized): Secondary | ICD-10-CM

## 2015-12-02 DIAGNOSIS — M25511 Pain in right shoulder: Secondary | ICD-10-CM

## 2015-12-02 NOTE — Therapy (Signed)
[facility] [address] [place], [place], [address] Phone: [phone]   Fax:  [phone]  Physical Therapy Treatment  Patient Details  Name: Scott Chapman MRN: [id number] Date of Birth: 03-02-1960 Referring Provider: Marciano SequinBryson Stillwell PA-C  Encounter Date: 12/02/2015      PT End of Session - 12/02/15 1354    PT Start Time 0102   PT Stop Time 0200   PT Time Calculation (min) 58 min      Past Medical History:  Diagnosis Date  . Allergy   . Hyperlipidemia   . Hypertension     Past Surgical History:  Procedure Laterality Date  . Colapsed lung Right   . HERNIA REPAIR    . left arm surgery    . TENDON REPAIR Right    arm    There were no vitals filed for this visit.      Subjective Assessment - 12/02/15 1310    Subjective I don't have pain when my arm is resting.  It hurts when i move it.   Pain Score 2    Pain Location Shoulder   Pain Orientation Right   Pain Descriptors / Indicators Sore   Pain Type Surgical pain   Pain Onset In the past 7 days                         [facility] Treatment/Exercise - 12/02/15 0001      Shoulder Exercises: Supine   Other Supine Exercises Supine cane exercise x 5 minutes.     Shoulder Exercises: Pulleys   Flexion Limitations 5 minutes.   Other Pulley Exercises UE Ranger on wall x 5 minutes with left hand cupping and assisting right UE into flexion and circles (CW and CCW).     Shoulder Exercises: ROM/Strengthening   UBE (Upper Arm Bike) Left UE propelling right x 6 minutes at 120 RPM's.     Programme researcher, broadcasting/film/videolectrical Stimulation   Electrical Stimulation Location --  RT SHLD.   Electrical Stimulation Action IFC   Electrical Stimulation Parameters 80-150 HZ x 15 minutes.   Electrical Stimulation Goals Pain     Manual Therapy   Passive ROM PROM x 17 minutes to patient's                   PT Short Term Goals - 11/05/15 1353      PT SHORT TERM GOAL #1   Title I with HEP   Time 4   Period Weeks   Status Achieved     PT SHORT TERM GOAL #2   Title PROM of R shoulder to Protocol limits   Time 4   Period Weeks   Status On-going           PT Long Term Goals - 10/28/15 1650      PT LONG TERM GOAL #1   Title R shoulder ROM WFL to perform ADLs   Time 8   Period Weeks   Status New     PT LONG TERM GOAL #2   Title R shoulder strength 5/5 to allow RTW   Time 8   Period Weeks   Status New     PT LONG TERM GOAL #3   Title Able to peform ADLS with pain 2/10  or less in R shoulder   Time 8   Period Weeks   Status New  Plan - 12/01/15 1341    Clinical Impression Statement Patient arrived to treatment with only low level R shoulder today. Patient able to complete exercises as directed with only reports of discomfort coming with AAROM protraction in posterior R shoulder. Firm end feels noted with PROM of R shoulder into flexion and ER with smooth arc of motion noted as well. Normal modalities response noted following removal of the modalities. Patient continues to be compliant with regular sling use that he has had since previous visit to MD.   Rehab Potential Excellent   Clinical Impairments Affecting Rehab Potential R biceps tendon repair 2015 current surgery 10/14/15    PT Frequency 3x / week   PT Duration 8 weeks   PT Treatment/Interventions ADLs/Self Care Home Management;Electrical Stimulation;Cryotherapy;Ultrasound;Neuromuscular re-education;Therapeutic exercise;Manual techniques;Scar mobilization;Passive range of motion;Vasopneumatic Device;Patient/family education   PT Next Visit [facility] and modalities next treatment per MPT POC.    PT Home Exercise Plan supine ER/IR with cane, pendulums, elbow flex/ext; AAROM flexion, table slides, AAROM ER   Consulted and Agree with Plan of Care Patient      Patient will benefit from skilled therapeutic intervention in order to improve the  following deficits and impairments:  Decreased range of motion, Pain, Impaired UE functional use, Decreased strength  Visit Diagnosis: Stiffness of right shoulder, not elsewhere classified  Acute pain of right shoulder  Muscle weakness (generalized)     Problem List There are no active problems to display for this patient.   Harriet Sutphen, [place] 12/02/2015, 2:00 PM  [facility] [address] [place], [place], [address] Phone: [phone]   Fax:  [phone]  Name: Scott Chapman MRN: [id number] Date of Birth: 1960-07-10

## 2015-12-09 ENCOUNTER — Ambulatory Visit: Payer: Worker's Compensation | Admitting: *Deleted

## 2015-12-09 DIAGNOSIS — M6281 Muscle weakness (generalized): Secondary | ICD-10-CM

## 2015-12-09 DIAGNOSIS — M25611 Stiffness of right shoulder, not elsewhere classified: Secondary | ICD-10-CM

## 2015-12-09 DIAGNOSIS — M25511 Pain in right shoulder: Secondary | ICD-10-CM

## 2015-12-09 NOTE — Therapy (Signed)
[facility] [address] [place], [place], [address] Phone: [phone]   Fax:  [phone]  Physical Therapy Treatment  Patient Details  Name: Scott CockayneJames M Chapman MRN: [id number] Date of Birth: Oct 03, 1960 Referring Provider: Marciano SequinBryson Stillwell PA-C  Encounter Date: 12/09/2015      PT End of Session - 12/09/15 1735    Visit Number 16   Number of Visits 30   Date for PT Re-Evaluation 12/23/15   PT Start Time 1300   PT Stop Time 1401   PT Time Calculation (min) 61 min      Past Medical History:  Diagnosis Date  . Allergy   . Hyperlipidemia   . Hypertension     Past Surgical History:  Procedure Laterality Date  . Colapsed lung Right   . HERNIA REPAIR    . left arm surgery    . TENDON REPAIR Right    arm    There were no vitals filed for this visit.                       [facility] PT Treatment/Exercise - 12/09/15 0001      Shoulder Exercises: Supine   Protraction AAROM;Both;20 reps   Flexion AAROM;Both;20 reps     Shoulder Exercises: Pulleys   Flexion Limitations 5 minutes.   Other Pulley Exercises UE Ranger on wall x 5 minutes with left hand cupping and assisting right UE into flexion and circles (CW and CCW).     Shoulder Exercises: ROM/Strengthening   UBE (Upper Arm Bike) Left UE propelling right x 6 minutes at 120 RPM's.     Modalities   Modalities Electrical Stimulation;Moist Heat     Moist Heat Therapy   Number Minutes Moist Heat 15 Minutes   Moist Heat Location Shoulder     Electrical Stimulation   Electrical Stimulation Location --  RT SHLD.   Electrical Stimulation Action IFC   Electrical Stimulation Parameters 80-150 hz x 10 mins   Electrical Stimulation Goals Pain     Manual Therapy   Passive ROM PROM of R shoulder into flex/ER with gentle holds at end range                  PT Short Term Goals - 11/05/15 1353      PT SHORT TERM GOAL #1   Title I with HEP   Time 4    Period Weeks   Status Achieved     PT SHORT TERM GOAL #2   Title PROM of R shoulder to Protocol limits   Time 4   Period Weeks   Status On-going           PT Long Term Goals - 10/28/15 1650      PT LONG TERM GOAL #1   Title R shoulder ROM WFL to perform ADLs   Time 8   Period Weeks   Status New     PT LONG TERM GOAL #2   Title R shoulder strength 5/5 to allow RTW   Time 8   Period Weeks   Status New     PT LONG TERM GOAL #3   Title Able to peform ADLS with pain 2/10  or less in R shoulder   Time 8   Period Weeks   Status New               Plan - 12/09/15 1736    Clinical Impression Statement Pt did fairly well with  Rx today for RT shldr. He was able to perform all AAROM exs for RT shldr with minimal pain increase. He was able to reach 140 degrees of passive flexion in supine today.  normal modality response.   Rehab Potential Excellent   Clinical Impairments Affecting Rehab Potential R biceps tendon repair 2015 current surgery 10/14/15    PT Frequency 3x / week   PT Duration 8 weeks   PT Treatment/Interventions ADLs/Self Care Home Management;Electrical Stimulation;Cryotherapy;Ultrasound;Neuromuscular re-education;Therapeutic exercise;Manual techniques;Scar mobilization;Passive range of motion;Vasopneumatic Device;Patient/family education   PT Next Visit Plan [facility] and modalities next treatment per MPT POC.    PT Home Exercise Plan supine ER/IR with cane, pendulums, elbow flex/ext; AAROM flexion, table slides, AAROM ER   Consulted and Agree with Plan of Care Patient      Patient will benefit from skilled therapeutic intervention in order to improve the following deficits and impairments:  Decreased range of motion, Pain, Impaired UE functional use, Decreased strength  Visit Diagnosis: Stiffness of right shoulder, not elsewhere classified  Acute pain of right shoulder  Muscle weakness (generalized)     Problem List There are  no active problems to display for this patient.   Calli Bashor,CHRIS, PTA 12/09/2015, 5:43 PM  [facility] [address] [place], [place], [address] Phone: [phone]   Fax:  [phone]  Name: Scott Chapman MRN: [id number] Date of Birth: 1960-04-24

## 2015-12-10 ENCOUNTER — Encounter: Payer: Self-pay | Admitting: Physical Therapy

## 2015-12-11 ENCOUNTER — Ambulatory Visit: Payer: Worker's Compensation | Admitting: *Deleted

## 2015-12-11 DIAGNOSIS — M6281 Muscle weakness (generalized): Secondary | ICD-10-CM

## 2015-12-11 DIAGNOSIS — M25611 Stiffness of right shoulder, not elsewhere classified: Secondary | ICD-10-CM

## 2015-12-11 DIAGNOSIS — M25511 Pain in right shoulder: Secondary | ICD-10-CM

## 2015-12-11 NOTE — Therapy (Signed)
[facility] [address] [place], [place], [address] Phone: [phone]   Fax:  [phone]  Physical Therapy Treatment  Patient Details  Name: Scott CockayneJames M Chapman MRN: [id number] Date of Birth: 17-Jan-1960 Referring Provider: Marciano SequinBryson Stillwell PA-C  Encounter Date: 12/11/2015      PT End of Session - 12/11/15 1348    Visit Number 17   Number of Visits 30   Date for PT Re-Evaluation 12/23/15   PT Start Time 1300   PT Stop Time 1400   PT Time Calculation (min) 60 min      Past Medical History:  Diagnosis Date  . Allergy   . Hyperlipidemia   . Hypertension     Past Surgical History:  Procedure Laterality Date  . Colapsed lung Right   . HERNIA REPAIR    . left arm surgery    . TENDON REPAIR Right    arm    There were no vitals filed for this visit.          [facility] - 12/11/15 0001      PROM   PROM Assessment Site Shoulder   Right Shoulder Flexion 145 Degrees   Right Shoulder External Rotation 50 Degrees                     [facility] Adult PT Treatment/Exercise - 12/11/15 0001      Shoulder Exercises: Supine   Protraction AAROM;Both;20 reps   Flexion AAROM;Both;20 reps     Shoulder Exercises: Pulleys   Flexion Limitations 5 minutes.   Other Pulley Exercises UE Ranger on wall x 5 minutes with left hand cupping and assisting right UE into flexion and circles (CW and CCW).     Shoulder Exercises: ROM/Strengthening   UBE (Upper Arm Bike) Left UE propelling right x 6 minutes at 120 RPM's.     Modalities   Modalities Electrical Stimulation;Moist Heat     Moist Heat Therapy   Number Minutes Moist Heat 15 Minutes   Moist Heat Location Shoulder     Electrical Stimulation   Electrical Stimulation Location R Deltoids   IFC x 15 mins 80-150hz    Electrical Stimulation Goals Pain     Manual Therapy   Passive ROM PROM of R shoulder into flex/ER with gentle holds at end range                   PT Short Term Goals - 11/05/15 1353      PT SHORT TERM GOAL #1   Title I with HEP   Time 4   Period Weeks   Status Achieved     PT SHORT TERM GOAL #2   Title PROM of R shoulder to Protocol limits   Time 4   Period Weeks   Status On-going           PT Long Term Goals - 10/28/15 1650      PT LONG TERM GOAL #1   Title R shoulder ROM WFL to perform ADLs   Time 8   Period Weeks   Status New     PT LONG TERM GOAL #2   Title R shoulder strength 5/5 to allow RTW   Time 8   Period Weeks   Status New     PT LONG TERM GOAL #3   Title Able to peform ADLS with pain 2/10  or less in R shoulder   Time 8   Period Weeks   Status New  Plan - 12/11/15 1355    Clinical Impression Statement Pt did fairly well with Rx today and was able to reach 145 degrees of flexion and 50 degrees of ER during PROM. He feels that today is the best his RT shldr has felt   Rehab Potential Excellent   Clinical Impairments Affecting Rehab Potential R biceps tendon repair 2015 current surgery 10/14/15    PT Frequency 3x / week   PT Duration 8 weeks   PT Treatment/Interventions ADLs/Self Care Home Management;Electrical Stimulation;Cryotherapy;Ultrasound;Neuromuscular re-education;Therapeutic exercise;Manual techniques;Scar mobilization;Passive range of motion;Vasopneumatic Device;Patient/family education   PT Next Visit Plan Progress to [facility] exercises and modalities next treatment per MPT POC.    PT Home Exercise Plan supine ER/IR with cane, pendulums, elbow flex/ext; AAROM flexion, table slides, AAROM ER   Consulted and Agree with Plan of Care Patient      Patient will benefit from skilled therapeutic intervention in order to improve the following deficits and impairments:  Decreased range of motion, Pain, Impaired UE functional use, Decreased strength  Visit Diagnosis: Stiffness of right shoulder, not elsewhere classified  Acute pain of right  shoulder  Muscle weakness (generalized)     Problem List There are no active problems to display for this patient.   Zubin Pontillo,CHRIS , PTA 12/11/2015, 2:00 PM  [facility] [address] [place], [place], [address] Phone: [phone]   Fax:  [phone]  Name: Scott CockayneJames M Chapman MRN: [id number] Date of Birth: [birth year]

## 2015-12-16 ENCOUNTER — Ambulatory Visit: Payer: Worker's Compensation | Attending: Orthopedic Surgery | Admitting: Physical Therapy

## 2015-12-16 DIAGNOSIS — M25511 Pain in right shoulder: Secondary | ICD-10-CM | POA: Diagnosis present

## 2015-12-16 DIAGNOSIS — M25611 Stiffness of right shoulder, not elsewhere classified: Secondary | ICD-10-CM | POA: Diagnosis present

## 2015-12-16 DIAGNOSIS — M6281 Muscle weakness (generalized): Secondary | ICD-10-CM | POA: Insufficient documentation

## 2015-12-16 NOTE — Therapy (Signed)
[facility] [address] [place], [place], [address] Phone: [phone]   Fax:  [phone]  Physical Therapy Treatment  Patient Details  Name: Scott CockayneJames M Chapman MRN: [id number] Date of Birth: 12-05-1960 Referring Provider: Marciano SequinBryson Stillwell PA-C  Encounter Date: 12/16/2015      PT End of Session - 12/16/15 1358    Visit Number 18   Number of Visits 30   Date for PT Re-Evaluation 12/23/15   PT Start Time 1255   PT Stop Time 1350   PT Time Calculation (min) 55 min   Activity Tolerance Patient tolerated treatment well   Behavior During Therapy [facility] for tasks assessed/performed      Past Medical History:  Diagnosis Date  . Allergy   . Hyperlipidemia   . Hypertension     Past Surgical History:  Procedure Laterality Date  . Colapsed lung Right   . HERNIA REPAIR    . left arm surgery    . TENDON REPAIR Right    arm    There were no vitals filed for this visit.      Subjective Assessment - 12/16/15 1355    Subjective I feel better about my shoulder.   Currently in Pain? Yes   Pain Score 2    Pain Location Shoulder   Pain Orientation Right   Pain Descriptors / Indicators Sore   Pain Type Surgical pain   Pain Onset In the past 7 days     Treatment:  UBE x 6 minutes, UE ranger on wall x 7 minutes; Pulleys x 7 minutes; LLLDS into ER in plane of scapula x 10 minutes.  PROM to right shoulder x 6 minutes.  HMP and e'stim x 15 minutes.                              PT Short Term Goals - 11/05/15 1353      PT SHORT TERM GOAL #1   Title I with HEP   Time 4   Period Weeks   Status Achieved     PT SHORT TERM GOAL #2   Title PROM of R shoulder to Protocol limits   Time 4   Period Weeks   Status On-going           PT Long Term Goals - 10/28/15 1650      PT LONG TERM GOAL #1   Title R shoulder ROM WFL to perform ADLs   Time 8   Period Weeks   Status New     PT LONG TERM GOAL #2   Title R  shoulder strength 5/5 to allow RTW   Time 8   Period Weeks   Status New     PT LONG TERM GOAL #3   Title Able to peform ADLS with pain 2/10  or less in R shoulder   Time 8   Period Weeks   Status New             Patient will benefit from skilled therapeutic intervention in order to improve the following deficits and impairments:  Decreased range of motion, Pain, Impaired UE functional use, Decreased strength  Visit Diagnosis: Stiffness of right shoulder, not elsewhere classified  Acute pain of right shoulder  Muscle weakness (generalized)     Problem List There are no active problems to display for this patient.   Aujanae Mccullum, ItalyHAD MPT 12/16/2015, 4:21 PM  [facility] [address] [place], [place], [address] Phone: [phone]   Fax:  [phone]  Name: Scott CockayneJames M Chapman MRN: [id number] Date of Birth: January 11, 1961

## 2015-12-17 ENCOUNTER — Encounter: Payer: Self-pay | Admitting: Physical Therapy

## 2015-12-17 ENCOUNTER — Ambulatory Visit: Payer: Worker's Compensation | Admitting: Physical Therapy

## 2015-12-17 DIAGNOSIS — M25511 Pain in right shoulder: Secondary | ICD-10-CM

## 2015-12-17 DIAGNOSIS — M25611 Stiffness of right shoulder, not elsewhere classified: Secondary | ICD-10-CM | POA: Diagnosis not present

## 2015-12-17 DIAGNOSIS — M6281 Muscle weakness (generalized): Secondary | ICD-10-CM

## 2015-12-17 NOTE — Therapy (Signed)
[facility] [address] [place], [place], [address] Phone: [phone]   Fax:  [phone]  Physical Therapy Treatment  Patient Details  Name: Scott CockayneJames M Kulesza MRN: [id number] Date of Birth: [birth year] Referring Provider: Marciano SequinBryson Stillwell PA-C  Encounter Date: 12/17/2015      PT End of Session - 12/17/15 1256    Visit Number 19   Number of Visits 30   Date for PT Re-Evaluation 12/23/15   PT Start Time 1301   PT Stop Time 1352   PT Time Calculation (min) 51 min   Activity Tolerance Patient tolerated treatment well   Behavior During Therapy [facility] for tasks assessed/performed      Past Medical History:  Diagnosis Date  . Allergy   . Hyperlipidemia   . Hypertension     Past Surgical History:  Procedure Laterality Date  . Colapsed lung Right   . HERNIA REPAIR    . left arm surgery    . TENDON REPAIR Right    arm    There were no vitals filed for this visit.      Subjective Assessment - 12/17/15 1256    Subjective Reports that his shoulder feels good today with pain reported as "nothing major." Reports that his arm still doesn't feel like his left arm.    Pertinent History HTN, asthma   Currently in Pain? Other (Comment)  Reported as "nothing major" with no other description provided            [facility] - 12/17/15 0001      Assessment   Medical Diagnosis s/p R RCR   Onset Date/Surgical Date 10/14/15   Hand Dominance Right   Next MD Visit 12/24/2015     Precautions   Precautions Shoulder   Type of Shoulder Precautions RCR see protocol   Precaution Comments no lifting, excessive ext, stretching or sudden movements                     [facility] Adult PT Treatment/Exercise - 12/17/15 0001      Shoulder Exercises: Supine   Protraction AROM;Right;20 reps   Flexion AROM;Both;20 reps     Shoulder Exercises: Sidelying   External Rotation AROM;Right;20 reps   Flexion AROM;Right;20 reps      Shoulder Exercises: Standing   Other Standing Exercises RUE ABCs on wall x1 rep     Shoulder Exercises: Pulleys   Flexion Other (comment)  x5 min   Other Pulley Exercises UE ranger flex/CW and CCW circles x20 reps each     Shoulder Exercises: ROM/Strengthening   UBE (Upper Arm Bike) UBE 120 RPM x6 min with LUE leading RUE     Shoulder Exercises: Isometric Strengthening   Flexion Other (comment)  5 sec hold x10 reps   Extension Other (comment)  5 sec hold x10 reps   External Rotation Other (comment)  5 sec hold x10 reps   Internal Rotation Other (comment)  5 sec hold x10 reps     Modalities   Modalities Electrical Stimulation;Moist Heat     Moist Heat Therapy   Number Minutes Moist Heat 15 Minutes   Moist Heat Location Shoulder     Electrical Stimulation   Electrical Stimulation Location R shoulder   Electrical Stimulation Action Pre-Mod   Electrical Stimulation Parameters 80-150 hz x15 min   Electrical Stimulation Goals Pain                PT Education - 12/17/15 1341  Education provided Yes   Education Details HEP- wall slides, supine AROM flexion, SL AROM flexion, SL AROM ER   Person(s) Educated Patient   Methods Explanation;Demonstration;Verbal cues;Handout   Comprehension Verbalized understanding;Verbal cues required;Returned demonstration          PT Short Term Goals - 11/05/15 1353      PT SHORT TERM GOAL #1   Title I with HEP   Time 4   Period Weeks   Status Achieved     PT SHORT TERM GOAL #2   Title PROM of R shoulder to Protocol limits   Time 4   Period Weeks   Status On-going           PT Long Term Goals - 12/17/15 1309      PT LONG TERM GOAL #1   Title R shoulder ROM WFL to perform ADLs   Time 8   Period Weeks   Status On-going     PT LONG TERM GOAL #2   Title R shoulder strength 5/5 to allow RTW   Time 8   Period Weeks   Status On-going     PT LONG TERM GOAL #3   Title Able to peform ADLS with pain 2/10  or less in  R shoulder   Time 8   Period Weeks   Status On-going               Plan - 12/17/15 1342    Clinical Impression Statement Patient tolerated today's advanced exercises well with only report of burning and fatigue with exercises. Patient introduced to R shoulder isometrics with only report of weakness and fatigue by patient. AROM exercises completed in various positions today with good technique by patient. New AROM HEP provided by patient with education given for technique and parameters. Patient verbalized understanding of HEP education. Normal modalities response noted following removal of the modalities.   Rehab Potential Excellent   Clinical Impairments Affecting Rehab Potential R biceps tendon repair 2015 current surgery 10/14/15    PT Frequency 3x / week   PT Duration 8 weeks   PT Treatment/Interventions ADLs/Self Care Home Management;Electrical Stimulation;Cryotherapy;Ultrasound;Neuromuscular re-education;Therapeutic exercise;Manual techniques;Scar mobilization;Passive range of motion;Vasopneumatic Device;Patient/family education   PT Next Visit Plan Progress according to protocol in media with modalities per MPT POC.   PT Home Exercise Plan supine ER/IR with cane, pendulums, elbow flex/ext; AAROM flexion, table slides, AAROM ER   Consulted and Agree with Plan of Care Patient      Patient will benefit from skilled therapeutic intervention in order to improve the following deficits and impairments:  Decreased range of motion, Pain, Impaired UE functional use, Decreased strength  Visit Diagnosis: Stiffness of right shoulder, not elsewhere classified  Acute pain of right shoulder  Muscle weakness (generalized)     Problem List There are no active problems to display for this patient.   Evelene CroonKelsey M Parsons, PTA 12/17/2015, 2:06 PM  [facility] [address] [place], [place], [address] Phone: [phone]   Fax:   [phone]  Name: Scott CockayneJames M Siess MRN: [id number] Date of Birth: [birth year]

## 2015-12-17 NOTE — Patient Instructions (Addendum)
ROM: Flexion (Alternate)    Stand at a wall with your right hand on wall and slide up to comfortable height. Repeat __10__ times per set. Do __2__ sets per session. Do __2-3__ sessions per day.  http://orth.exer.us/758   Copyright  VHI. All rights reserved.  ROM: Flexion (Standing)    Lay on your back and bring arms straight out in front and raise as high as possible without pain. Keep palms facing up.  Lay on your left side with your right arm by your side and bring arm in front of you as if slicing room in half.  Repeat __10__ times per set. Do __2__ sets per session. Do __2-3__ sessions per day of each exercise.  http://orth.exer.us/908   Copyright  VHI. All rights reserved.  Progressive Resisted: External Rotation (Side-Lying)    Place a towel under arm, raise right forearm toward ceiling. Keep elbow bent and at side. NO WEIGHTS!! Repeat _10___ times per set. Do __2__ sets per session. Do _2-3___ sessions per day.  http://orth.exer.us/878   Copyright  VHI. All rights reserved.

## 2015-12-18 ENCOUNTER — Ambulatory Visit: Payer: Worker's Compensation | Attending: Orthopedic Surgery | Admitting: *Deleted

## 2015-12-18 DIAGNOSIS — M25611 Stiffness of right shoulder, not elsewhere classified: Secondary | ICD-10-CM | POA: Diagnosis present

## 2015-12-18 DIAGNOSIS — M25511 Pain in right shoulder: Secondary | ICD-10-CM | POA: Insufficient documentation

## 2015-12-18 DIAGNOSIS — M6281 Muscle weakness (generalized): Secondary | ICD-10-CM

## 2015-12-18 NOTE — Therapy (Signed)
[facility] [address] [place], [place], [address] Phone: [phone]   Fax:  [phone]  Physical Therapy Treatment  Patient Details  Name: Scott Chapman MRN: [id number] Date of Birth: Jul 10, 1960 Referring Provider: Marciano SequinBryson Stillwell PA-C  Encounter Date: 12/18/2015      PT End of Session - 12/18/15 1402    Visit Number 20   Number of Visits 30   Date for PT Re-Evaluation 12/23/15   PT Start Time 1345   PT Stop Time 1445   PT Time Calculation (min) 60 min      Past Medical History:  Diagnosis Date  . Allergy   . Hyperlipidemia   . Hypertension     Past Surgical History:  Procedure Laterality Date  . Colapsed lung Right   . HERNIA REPAIR    . left arm surgery    . TENDON REPAIR Right    arm    There were no vitals filed for this visit.      Subjective Assessment - 12/18/15 1401    Subjective Reports that his shoulder feels good today with pain reported as "nothing major." Reports that his arm still doesn't feel like his left arm.    Pertinent History HTN, asthma   Currently in Pain? Yes   Pain Score 2    Pain Location Shoulder   Pain Orientation Right   Pain Descriptors / Indicators Sore   Pain Type Surgical pain   Pain Onset In the past 7 days   Pain Frequency Intermittent                         [facility] Adult PT Treatment/Exercise - 12/18/15 0001      Shoulder Exercises: Supine   Protraction AROM;Right;20 reps   Flexion AROM;Both;20 reps     Shoulder Exercises: Sidelying   External Rotation AROM;Right;20 reps   Flexion AROM;Right;20 reps     Shoulder Exercises: Pulleys   Flexion Other (comment)  x5 min   Other Pulley Exercises UE ranger flex/CW and CCW circles x20 reps each     Shoulder Exercises: ROM/Strengthening   UBE (Upper Arm Bike) UBE 120 RPM x6 min with LUE leading RUE     Shoulder Exercises: Isometric Strengthening   Flexion Other (comment)  5 sec hold x10 reps   Extension Other (comment)  5 sec hold x10 reps   External Rotation Other (comment)  5 sec hold x10 reps   Internal Rotation Other (comment)  5 sec hold x10 reps     Modalities   Modalities Electrical Stimulation;Moist Heat     Moist Heat Therapy   Number Minutes Moist Heat 15 Minutes   Moist Heat Location Shoulder     Electrical Stimulation   Electrical Stimulation Location R Deltoids   IFC x 15 mins 80-150hz    Electrical Stimulation Goals Pain     Manual Therapy   Passive ROM PROM of R shoulder into flex/ER with gentle holds at end range, f/b  Rhythmic stabs                PT Education - 12/17/15 1341    Education provided Yes   Education Details HEP- wall slides, supine AROM flexion, SL AROM flexion, SL AROM ER   Person(s) Educated Patient   Methods Explanation;Demonstration;Verbal cues;Handout   Comprehension Verbalized understanding;Verbal cues required;Returned demonstration          PT Short Term Goals - 11/05/15 1353  PT SHORT TERM GOAL #1   Title I with HEP   Time 4   Period Weeks   Status Achieved     PT SHORT TERM GOAL #2   Title PROM of R shoulder to Protocol limits   Time 4   Period Weeks   Status On-going           PT Long Term Goals - 12/17/15 1309      PT LONG TERM GOAL #1   Title R shoulder ROM WFL to perform ADLs   Time 8   Period Weeks   Status On-going     PT LONG TERM GOAL #2   Title R shoulder strength 5/5 to allow RTW   Time 8   Period Weeks   Status On-going     PT LONG TERM GOAL #3   Title Able to peform ADLS with pain 2/10  or less in R shoulder   Time 8   Period Weeks   Status On-going               Plan - 12/18/15 1418    Clinical Impression Statement Pt did great today and was able to continue with AROM exs for RT shldr. He has some soreness in RT shldr today, but continues to progress.   Rehab Potential Excellent   Clinical Impairments Affecting Rehab Potential R biceps tendon repair 2015  current surgery 10/14/15    PT Frequency 3x / week   PT Duration 8 weeks   PT Treatment/Interventions ADLs/Self Care Home Management;Electrical Stimulation;Cryotherapy;Ultrasound;Neuromuscular re-education;Therapeutic exercise;Manual techniques;Scar mobilization;Passive range of motion;Vasopneumatic Device;Patient/family education   PT Next Visit Plan Progress according to protocol in media with modalities per MPT POC.   PT Home Exercise Plan supine ER/IR with cane, pendulums, elbow flex/ext; AAROM flexion, table slides, AAROM ER   Consulted and Agree with Plan of Care Patient      Patient will benefit from skilled therapeutic intervention in order to improve the following deficits and impairments:  Decreased range of motion, Pain, Impaired UE functional use, Decreased strength  Visit Diagnosis: Stiffness of right shoulder, not elsewhere classified  Acute pain of right shoulder  Muscle weakness (generalized)     Problem List There are no active problems to display for this patient.   RAMSEUR,CHRIS, PTA 12/18/2015, 6:10 PM  [facility] [address] [place], [place], [address] Phone: [phone]   Fax:  [phone]  Name: Scott Chapman MRN: [id number] Date of Birth: [birth year]

## 2015-12-23 ENCOUNTER — Ambulatory Visit: Payer: Worker's Compensation | Attending: Orthopedic Surgery | Admitting: *Deleted

## 2015-12-23 DIAGNOSIS — M6281 Muscle weakness (generalized): Secondary | ICD-10-CM

## 2015-12-23 DIAGNOSIS — M25511 Pain in right shoulder: Secondary | ICD-10-CM

## 2015-12-23 DIAGNOSIS — M25611 Stiffness of right shoulder, not elsewhere classified: Secondary | ICD-10-CM | POA: Insufficient documentation

## 2015-12-23 NOTE — Therapy (Signed)
[facility] [address] [place], [place], [address] Phone: [phone]   Fax:  [phone]  Physical Therapy Treatment  Patient Details  Name: Scott Chapman MRN: [id number] Date of Birth: [birth year] Referring Provider: Marciano SequinBryson Stillwell PA-C  Encounter Date: 12/23/2015      PT End of Session - 12/23/15 1303    Visit Number 21   Number of Visits 30   Date for PT Re-Evaluation 12/23/15   PT Start Time 1300   PT Stop Time 1400   PT Time Calculation (min) 60 min      Past Medical History:  Diagnosis Date  . Allergy   . Hyperlipidemia   . Hypertension     Past Surgical History:  Procedure Laterality Date  . Colapsed lung Right   . HERNIA REPAIR    . left arm surgery    . TENDON REPAIR Right    arm    There were no vitals filed for this visit.      Subjective Assessment - 12/23/15 1302    Subjective Reports that his shoulder feels good today with pain reported as "nothing major." Reports that his arm still doesn't feel like his left arm.    Pertinent History HTN, asthma   Currently in Pain? Yes   Pain Score 2    Pain Location Shoulder   Pain Orientation Right   Pain Type Surgical pain   Pain Onset In the past 7 days   Pain Frequency Intermittent            [facility] PT Assessment - 12/23/15 0001      ROM / Strength   AROM / PROM / Strength AROM     AROM   Overall AROM  Deficits   Overall AROM Comments standing Flexion 145 degrees, ER 50 degrees     PROM   PROM Assessment Site Shoulder   Right Shoulder Flexion 160 Degrees   Right Shoulder External Rotation 60 Degrees                     [facility] Adult PT Treatment/Exercise - 12/23/15 0001      Shoulder Exercises: Supine   Protraction AROM;Right;20 reps   Flexion AROM;Both;20 reps     Shoulder Exercises: Prone   Retraction AROM;20 reps;10 reps   Extension AROM;Right;10 reps;20 reps     Shoulder Exercises: Sidelying   External Rotation  Right;10 reps;20 reps   Flexion AROM;10 reps;20 reps     Shoulder Exercises: Pulleys   Flexion Other (comment)  x5 min   Other Pulley Exercises UE ranger flex/CW and CCW circles x20 reps each     Shoulder Exercises: ROM/Strengthening   UBE (Upper Arm Bike) UBE 120 RPM x6 min with LUE leading RUE     Modalities   Modalities Electrical Stimulation;Moist Heat     Moist Heat Therapy   Number Minutes Moist Heat 15 Minutes   Moist Heat Location Shoulder     Electrical Stimulation   Electrical Stimulation Location R Deltoids   IFC x 15 mins 80-150hz    Electrical Stimulation Goals Pain     Manual Therapy   Passive ROM PROM of R shoulder into flex/ER with gentle holds at end range, f/b  Rhythmic stabs                  PT Short Term Goals - 11/05/15 1353      PT SHORT TERM GOAL #1   Title I with HEP  Time 4   Period Weeks   Status Achieved     PT SHORT TERM GOAL #2   Title PROM of R shoulder to Protocol limits   Time 4   Period Weeks   Status On-going           PT Long Term Goals - 12/17/15 1309      PT LONG TERM GOAL #1   Title R shoulder ROM WFL to perform ADLs   Time 8   Period Weeks   Status On-going     PT LONG TERM GOAL #2   Title R shoulder strength 5/5 to allow RTW   Time 8   Period Weeks   Status On-going     PT LONG TERM GOAL #3   Title Able to peform ADLS with pain 2/10  or less in R shoulder   Time 8   Period Weeks   Status On-going               Plan - 12/23/15 1304    Clinical Impression Statement Pt did great today with AAROM and AROM Exs for RT shldr. His AROM has improved to 145 degrees and ER to 50 degrees in standing and PROM for flexion to 160 and ER to 60 degrees. Pt will F/U with MD tomorrow    Rehab Potential Excellent   Clinical Impairments Affecting Rehab Potential R biceps tendon repair 2015 current surgery 10/14/15    PT Frequency 3x / week   PT Duration 8 weeks   PT Treatment/Interventions ADLs/Self Care Home  Management;Electrical Stimulation;Cryotherapy;Ultrasound;Neuromuscular re-education;Therapeutic exercise;Manual techniques;Scar mobilization;Passive range of motion;Vasopneumatic Device;Patient/family education   PT Next Visit Plan Progress according to protocol in media with modalities per MPT POC.  Send MD note    PT Home Exercise Plan supine ER/IR with cane, pendulums, elbow flex/ext; AAROM flexion, table slides, AAROM ER   Consulted and Agree with Plan of Care Patient      Patient will benefit from skilled therapeutic intervention in order to improve the following deficits and impairments:  Decreased range of motion, Pain, Impaired UE functional use, Decreased strength  Visit Diagnosis: Stiffness of right shoulder, not elsewhere classified  Acute pain of right shoulder  Muscle weakness (generalized)     Problem List There are no active problems to display for this patient.   APPLEGATE, ItalyHAD, PTA 12/23/2015, 3:02 PM [place]had Applegate MPT [facility] [address] [place], [place], [address] Phone: [phone]   Fax:  [phone]  Name: Scott Chapman MRN: [id number] Date of Birth: 07-13-1960

## 2015-12-24 ENCOUNTER — Ambulatory Visit: Payer: Worker's Compensation | Admitting: Physical Therapy

## 2015-12-25 ENCOUNTER — Ambulatory Visit: Payer: Worker's Compensation | Admitting: Physical Therapy

## 2015-12-25 ENCOUNTER — Encounter: Payer: Self-pay | Admitting: Physical Therapy

## 2015-12-25 DIAGNOSIS — M25611 Stiffness of right shoulder, not elsewhere classified: Secondary | ICD-10-CM | POA: Diagnosis not present

## 2015-12-25 DIAGNOSIS — M6281 Muscle weakness (generalized): Secondary | ICD-10-CM

## 2015-12-25 DIAGNOSIS — M25511 Pain in right shoulder: Secondary | ICD-10-CM

## 2015-12-25 NOTE — Therapy (Signed)
[facility] [address] [place], [place], [address] Phone: [phone]   Fax:  [phone]  Physical Therapy Treatment  Patient Details  Name: Scott Chapman MRN: [id number] Date of Birth: 12/18/1960 Referring Provider: Marciano SequinBryson Stillwell PA-C  Encounter Date: 12/25/2015      PT End of Session - 12/25/15 1305    Visit Number 22   Number of Visits 30   Date for PT Re-Evaluation 12/23/15   PT Start Time 1308   PT Stop Time 1403  late arrival to treatment area   PT Time Calculation (min) 55 min   Activity Tolerance Patient tolerated treatment well   Behavior During Therapy [facility] for tasks assessed/performed      Past Medical History:  Diagnosis Date  . Allergy   . Hyperlipidemia   . Hypertension     Past Surgical History:  Procedure Laterality Date  . Colapsed lung Right   . HERNIA REPAIR    . left arm surgery    . TENDON REPAIR Right    arm    There were no vitals filed for this visit.      Subjective Assessment - 12/25/15 1305    Subjective Reports that MD worried regarding ER motion.   Pertinent History HTN, asthma   Currently in Pain? No/denies            [facility] - 12/25/15 0001      Assessment   Medical Diagnosis s/p R RCR   Onset Date/Surgical Date 10/14/15   Hand Dominance Right   Next MD Visit 01/21/2015     Precautions   Precautions Shoulder   Type of Shoulder Precautions RCR see protocol   Precaution Comments no lifting, excessive ext, stretching or sudden movements                     [facility] Adult PT Treatment/Exercise - 12/25/15 0001      Shoulder Exercises: Supine   Protraction AROM;Right;20 reps     Shoulder Exercises: Sidelying   External Rotation AROM;Right;20 reps   Flexion AROM;Right;20 reps     Shoulder Exercises: Standing   Other Standing Exercises RUE ABCs on wall x1 rep     Shoulder Exercises: Pulleys   Flexion 3 minutes   Other Pulley Exercises UE  ranger flex/CW and CCW circles x20 reps each     Shoulder Exercises: ROM/Strengthening   UBE (Upper Arm Bike) UBE 90 RPM x6 min   Wall Pushups 20 reps     Shoulder Exercises: Stretch   Internal Rotation Stretch 5 reps  30 sec towel stretch   External Rotation Stretch 3 reps;30 seconds     Modalities   Modalities Electrical Stimulation;Moist Heat     Moist Heat Therapy   Number Minutes Moist Heat 15 Minutes   Moist Heat Location Shoulder     Electrical Stimulation   Electrical Stimulation Location R shoulder   Electrical Stimulation Action IFC   Electrical Stimulation Parameters 1-10 hz x15 min   Electrical Stimulation Goals Pain     Manual Therapy   Manual Therapy Passive ROM   Passive ROM PROM of R shoulder into ER with holds at end range                  PT Short Term Goals - 11/05/15 1353      PT SHORT TERM GOAL #1   Title I with HEP   Time 4   Period Weeks   Status  Achieved     PT SHORT TERM GOAL #2   Title PROM of R shoulder to Protocol limits   Time 4   Period Weeks   Status On-going           PT Long Term Goals - 12/17/15 1309      PT LONG TERM GOAL #1   Title R shoulder ROM WFL to perform ADLs   Time 8   Period Weeks   Status On-going     PT LONG TERM GOAL #2   Title R shoulder strength 5/5 to allow RTW   Time 8   Period Weeks   Status On-going     PT LONG TERM GOAL #3   Title Able to peform ADLS with pain 2/10  or less in R shoulder   Time 8   Period Weeks   Status On-going               Plan - 12/25/15 1355    Clinical Impression Statement Patient toleratd today's treatment well with all exercises per protocol from MD. Patient introduced to R shoulder stretching to improve ROM as approved by MD per patient report. Patient had no complaints of pain with any exercises although weakness noted in RUE with wall pushups. PROM of R shoulder into ER completed today with holds at end range to improve ROM. Patient denied any  tightness in anterior shoulder via Pectoralis and Bicep reporting limitation felt in posteriolateral R shoulder. Normal modalities response noted following removal of the modalities.   Rehab Potential Excellent   Clinical Impairments Affecting Rehab Potential R biceps tendon repair 2015 current surgery 10/14/15    PT Frequency 3x / week   PT Duration 8 weeks   PT Treatment/Interventions ADLs/Self Care Home Management;Electrical Stimulation;Cryotherapy;Ultrasound;Neuromuscular re-education;Therapeutic exercise;Manual techniques;Scar mobilization;Passive range of motion;Vasopneumatic Device;Patient/family education   PT Next Visit Plan Progress according to protocol in media with modalities per MPT POC.     PT Home Exercise Plan supine ER/IR with cane, pendulums, elbow flex/ext; AAROM flexion, table slides, AAROM ER   Consulted and Agree with Plan of Care Patient      Patient will benefit from skilled therapeutic intervention in order to improve the following deficits and impairments:  Decreased range of motion, Pain, Impaired UE functional use, Decreased strength  Visit Diagnosis: Stiffness of right shoulder, not elsewhere classified  Acute pain of right shoulder  Muscle weakness (generalized)     Problem List There are no active problems to display for this patient.   Evelene CroonKelsey M Parsons, PTA 12/25/2015, 2:09 PM  [facility] [address] [place], [place], [address] Phone: [phone]   Fax:  [phone]  Name: Scott Chapman MRN: [id number] Date of Birth: 15-Jun-1960

## 2015-12-30 ENCOUNTER — Encounter: Payer: Self-pay | Admitting: Physical Therapy

## 2015-12-30 ENCOUNTER — Ambulatory Visit: Payer: Worker's Compensation | Attending: Orthopedic Surgery | Admitting: Physical Therapy

## 2015-12-30 DIAGNOSIS — M25511 Pain in right shoulder: Secondary | ICD-10-CM

## 2015-12-30 DIAGNOSIS — M25611 Stiffness of right shoulder, not elsewhere classified: Secondary | ICD-10-CM | POA: Insufficient documentation

## 2015-12-30 DIAGNOSIS — M6281 Muscle weakness (generalized): Secondary | ICD-10-CM

## 2015-12-30 NOTE — Therapy (Signed)
[facility] [address] [place], [place], [address] Phone: [phone]   Fax:  [phone]  Physical Therapy Treatment  Patient Details  Name: Scott Chapman MRN: [id number] Date of Birth: 1960-04-13 Referring Provider: Marciano SequinBryson Stillwell PA-C  Encounter Date: 12/30/2015      PT End of Session - 12/30/15 1302    Visit Number 23   Number of Visits 30   Date for PT Re-Evaluation 12/23/15   PT Start Time 1300   PT Stop Time 1355   PT Time Calculation (min) 55 min   Activity Tolerance Patient tolerated treatment well   Behavior During Therapy [facility] for tasks assessed/performed      Past Medical History:  Diagnosis Date  . Allergy   . Hyperlipidemia   . Hypertension     Past Surgical History:  Procedure Laterality Date  . Colapsed lung Right   . HERNIA REPAIR    . left arm surgery    . TENDON REPAIR Right    arm    There were no vitals filed for this visit.      Subjective Assessment - 12/30/15 1302    Subjective Reports only pain with reaching back behind him.   Pertinent History HTN, asthma   Currently in Pain? No/denies            [facility] - 12/30/15 0001      Assessment   Medical Diagnosis s/p R RCR   Onset Date/Surgical Date 10/14/15   Hand Dominance Right   Next MD Visit 01/21/2015     Precautions   Precautions Shoulder   Type of Shoulder Precautions RCR see protocol   Precaution Comments no lifting, excessive ext, stretching or sudden movements                     [facility] Adult PT Treatment/Exercise - 12/30/15 0001      Shoulder Exercises: Supine   Flexion AROM;Both;20 reps   Flexion Limitations Reclined for lawnchair progression     Shoulder Exercises: Sidelying   External Rotation AROM;Right   External Rotation Limitations 3x10 reps   Flexion AROM;Right  3x10 reps     Shoulder Exercises: Standing   Other Standing Exercises RUE ABCs on wall x1 rep     Shoulder  Exercises: Pulleys   Flexion 3 minutes   Other Pulley Exercises UE ranger flex/CW and CCW circles x30 reps each     Shoulder Exercises: ROM/Strengthening   UBE (Upper Arm Bike) 60 RPM x6 min   Wall Pushups 20 reps     Shoulder Exercises: Stretch   Internal Rotation Stretch 3 reps  20 sec hold   External Rotation Stretch 3 reps;20 seconds   Other Shoulder Stretches R anterior capsule stretch 3x20 sec     Modalities   Modalities Electrical Stimulation;Moist Heat     Moist Heat Therapy   Number Minutes Moist Heat 15 Minutes   Moist Heat Location Shoulder     Electrical Stimulation   Electrical Stimulation Location R shoulder   Electrical Stimulation Action IFC   Electrical Stimulation Parameters 1-10 hz x15 min   Electrical Stimulation Goals Pain     Manual Therapy   Manual Therapy Passive ROM   Passive ROM PROM of R shoulder into ER with holds at end range                  PT Short Term Goals - 11/05/15 1353      PT SHORT  TERM GOAL #1   Title I with HEP   Time 4   Period Weeks   Status Achieved     PT SHORT TERM GOAL #2   Title PROM of R shoulder to Protocol limits   Time 4   Period Weeks   Status On-going           PT Long Term Goals - 12/17/15 1309      PT LONG TERM GOAL #1   Title R shoulder ROM WFL to perform ADLs   Time 8   Period Weeks   Status On-going     PT LONG TERM GOAL #2   Title R shoulder strength 5/5 to allow RTW   Time 8   Period Weeks   Status On-going     PT LONG TERM GOAL #3   Title Able to peform ADLS with pain 2/10  or less in R shoulder   Time 8   Period Weeks   Status On-going               Plan - 12/30/15 1348    Clinical Impression Statement Patient tolerated today's treatment well with only reports of RUE fatigue with exercises. Patient continues to experience stretch with ER and IR stretches. Patient did not demonstrate as much RUE fatigue with wall pushups in form of shakiness. Reclined supine AROM  flexion completed today to progress in lawnchair progression. PROM of R shoulder completed into ER with holds at end range to promote increased ROM. Normal modalities response noted following removal of the modalities.   Rehab Potential Excellent   Clinical Impairments Affecting Rehab Potential R biceps tendon repair 2015 current surgery 10/14/15    PT Frequency 3x / week   PT Duration 8 weeks   PT Treatment/Interventions ADLs/Self Care Home Management;Electrical Stimulation;Cryotherapy;Ultrasound;Neuromuscular re-education;Therapeutic exercise;Manual techniques;Scar mobilization;Passive range of motion;Vasopneumatic Device;Patient/family education   PT Next Visit Plan Progress according to protocol in media with modalities per MPT POC.     PT Home Exercise Plan supine ER/IR with cane, pendulums, elbow flex/ext; AAROM flexion, table slides, AAROM ER   Consulted and Agree with Plan of Care Patient      Patient will benefit from skilled therapeutic intervention in order to improve the following deficits and impairments:  Decreased range of motion, Pain, Impaired UE functional use, Decreased strength  Visit Diagnosis: Stiffness of right shoulder, not elsewhere classified  Acute pain of right shoulder  Muscle weakness (generalized)     Problem List There are no active problems to display for this patient.   Evelene CroonKelsey M Parsons, PTA 12/30/2015, 2:08 PM  [facility] [address] [place], [place], [address] Phone: [phone]   Fax:  [phone]  Name: Scott Chapman MRN: [id number] Date of Birth: 1960-05-01

## 2015-12-31 ENCOUNTER — Ambulatory Visit: Payer: Worker's Compensation | Admitting: Physical Therapy

## 2016-01-01 ENCOUNTER — Ambulatory Visit: Payer: Worker's Compensation | Admitting: *Deleted

## 2016-01-01 DIAGNOSIS — M25611 Stiffness of right shoulder, not elsewhere classified: Secondary | ICD-10-CM

## 2016-01-01 DIAGNOSIS — M25511 Pain in right shoulder: Secondary | ICD-10-CM

## 2016-01-01 DIAGNOSIS — M6281 Muscle weakness (generalized): Secondary | ICD-10-CM

## 2016-01-01 NOTE — Therapy (Signed)
[facility] [address] [place], [place], [address] Phone: [phone]   Fax:  [phone]  Physical Therapy Treatment  Patient Details  Name: Scott Chapman MRN: [id number] Date of Birth: Jun 09, 1960 Referring Provider: Marciano SequinBryson Stillwell PA-C  Encounter Date: 01/01/2016      PT End of Session - 01/01/16 1330    Visit Number 24   Number of Visits 30   Date for PT Re-Evaluation 12/23/15   PT Start Time 1310   PT Stop Time 1410   PT Time Calculation (min) 60 min      Past Medical History:  Diagnosis Date  . Allergy   . Hyperlipidemia   . Hypertension     Past Surgical History:  Procedure Laterality Date  . Colapsed lung Right   . HERNIA REPAIR    . left arm surgery    . TENDON REPAIR Right    arm    There were no vitals filed for this visit.      Subjective Assessment - 01/01/16 1327    Subjective BTW yesterday 8-4 and today. RT shldr is sore and distal bicep is also sore   Pertinent History HTN, asthma   Currently in Pain? Yes   Pain Location Shoulder   Pain Orientation Right   Pain Descriptors / Indicators Sore   Pain Type Surgical pain   Pain Onset More than a month ago   Pain Frequency Intermittent                         [facility] Adult PT Treatment/Exercise - 01/01/16 0001      Shoulder Exercises: Supine   Flexion AROM;Both;20 reps;10 reps     Shoulder Exercises: Prone   Retraction AROM;20 reps;10 reps   Extension AROM;Right;10 reps;20 reps   Horizontal ABduction 1 AROM;Right;20 reps;10 reps     Shoulder Exercises: Sidelying   External Rotation AROM;Right;10 reps;20 reps   Flexion AROM;Right  3x10 reps     Shoulder Exercises: Pulleys   Flexion 3 minutes   Other Pulley Exercises UE ranger flex/CW and CCW circles x30 reps each     Shoulder Exercises: ROM/Strengthening   UBE (Upper Arm Bike) UBE 120 RPM x6 min with LUE leading RUE     Modalities   Modalities Electrical  Stimulation;Moist Heat     Moist Heat Therapy   Number Minutes Moist Heat 15 Minutes   Moist Heat Location Shoulder     Electrical Stimulation   Electrical Stimulation Location R Deltoids   IFC x 15 mins 80-150hz    Electrical Stimulation Goals Pain     Manual Therapy   Manual Therapy Passive ROM   Passive ROM PROM of R shoulder into ER with holds at end range                  PT Short Term Goals - 11/05/15 1353      PT SHORT TERM GOAL #1   Title I with HEP   Time 4   Period Weeks   Status Achieved     PT SHORT TERM GOAL #2   Title PROM of R shoulder to Protocol limits   Time 4   Period Weeks   Status On-going           PT Long Term Goals - 12/17/15 1309      PT LONG TERM GOAL #1   Title R shoulder ROM WFL to perform ADLs   Time 8  Period Weeks   Status On-going     PT LONG TERM GOAL #2   Title R shoulder strength 5/5 to allow RTW   Time 8   Period Weeks   Status On-going     PT LONG TERM GOAL #3   Title Able to peform ADLS with pain 2/10  or less in R shoulder   Time 8   Period Weeks   Status On-going               Plan - 01/01/16 1626    Clinical Impression Statement Pt arrived to clinic today with some increased soreness in RT shldr and distal bicep insertion. He returned to work on light duty yesterday. His ER ROM was good at 70 degrees and IR to 60 degrees. We also discussed IR stretch for HBB using a towel as per MD. Pt did well with Rx, but complained of increased soreness.   Rehab Potential Excellent   Clinical Impairments Affecting Rehab Potential R biceps tendon repair 2015 current surgery 10/14/15    PT Frequency 3x / week   PT Duration 8 weeks   PT Treatment/Interventions ADLs/Self Care Home Management;Electrical Stimulation;Cryotherapy;Ultrasound;Neuromuscular re-education;Therapeutic exercise;Manual techniques;Scar mobilization;Passive range of motion;Vasopneumatic Device;Patient/family education   PT Next Visit Plan  Progress according to protocol in media with modalities per MPT POC.     PT Home Exercise Plan supine ER/IR with cane, pendulums, elbow flex/ext; AAROM flexion, table slides, AAROM ER.   No new order from MD. We will send Recert to cont.   Consulted and Agree with Plan of Care Patient      Patient will benefit from skilled therapeutic intervention in order to improve the following deficits and impairments:  Decreased range of motion, Pain, Impaired UE functional use, Decreased strength  Visit Diagnosis: Stiffness of right shoulder, not elsewhere classified  Acute pain of right shoulder  Muscle weakness (generalized)     Problem List There are no active problems to display for this patient.   Kayal Mula,CHRIS, PTA 01/01/2016, 4:37 PM  [facility] [address] [place], [place], [address] Phone: [phone]   Fax:  [phone]  Name: Scott Chapman MRN: [id number] Date of Birth: 05-12-1960

## 2016-01-06 ENCOUNTER — Encounter: Payer: Self-pay | Admitting: Physical Therapy

## 2016-01-07 ENCOUNTER — Encounter: Payer: Self-pay | Admitting: Physical Therapy

## 2016-01-07 ENCOUNTER — Ambulatory Visit: Payer: Worker's Compensation | Admitting: Physical Therapy

## 2016-01-07 DIAGNOSIS — M6281 Muscle weakness (generalized): Secondary | ICD-10-CM

## 2016-01-07 DIAGNOSIS — M25511 Pain in right shoulder: Secondary | ICD-10-CM

## 2016-01-07 DIAGNOSIS — M25611 Stiffness of right shoulder, not elsewhere classified: Secondary | ICD-10-CM | POA: Diagnosis not present

## 2016-01-07 NOTE — Therapy (Signed)
[facility] [address] [place], [place], [address] Phone: [phone]   Fax:  [phone]  Physical Therapy Treatment  Patient Details  Name: Scott Chapman MRN: [id number] Date of Birth: 1960-04-05 Referring Provider: Marciano SequinBryson Stillwell PA-C  Encounter Date: 01/07/2016      PT End of Session - 01/07/16 1302    Visit Number 25   Number of Visits 30   Date for PT Re-Evaluation 02/03/16   PT Start Time 1303   PT Stop Time 1358   PT Time Calculation (min) 55 min   Activity Tolerance Patient tolerated treatment well   Behavior During Therapy [facility] for tasks assessed/performed      Past Medical History:  Diagnosis Date  . Allergy   . Hyperlipidemia   . Hypertension     Past Surgical History:  Procedure Laterality Date  . Colapsed lung Right   . HERNIA REPAIR    . left arm surgery    . TENDON REPAIR Right    arm    There were no vitals filed for this visit.      Subjective Assessment - 01/07/16 1302    Subjective Reports that the repititive work with light duty has really flared up the distal R bicep attachment. Reports that he called Dr. Thomasena Edisollins regarding the type of work they have him doing.   Pertinent History HTN, asthma   Currently in Pain? Yes   Pain Score 3    Pain Location Arm   Pain Orientation Right;Anterior;Distal   Pain Descriptors / Indicators Other (Comment)  "pull"   Pain Onset More than a month ago            [facility] 01/07[facility]16 0001      Assessment   Medical Diagnosis s/p R RCR   Onset Date/Surgical Date 10/14/15   Hand Dominance Right   Next MD Visit 01/26/2015     Precautions   Precautions Shoulder   Type of Shoulder Precautions RCR see protocol   Precaution Comments no lifting, excessive ext, stretching or sudden movements     ROM / Strength   AROM / PROM / Strength AROM     AROM   Overall AROM  Deficits   AROM Assessment Site Shoulder   Right/Left Shoulder Right   Right Shoulder Internal Rotation 50 Degrees   Right Shoulder External Rotation 46 Degrees                     [facility] Adult PT Treatment/Exercise - 01/07/16 0001      Shoulder Exercises: Supine   Protraction Strengthening;Right;20 reps;Weights   Protraction Weight (lbs) 1     Shoulder Exercises: Sidelying   External Rotation Strengthening;Right;20 reps;Weights   External Rotation Weight (lbs) 1   Internal Rotation Strengthening;Right;20 reps;Weights   Internal Rotation Weight (lbs) 1     Shoulder Exercises: Standing   External Rotation Strengthening;Right;20 reps;Theraband   Theraband Level (Shoulder External Rotation) Level 1 (Yellow)   Internal Rotation Strengthening;Right;20 reps;Theraband   Theraband Level (Shoulder Internal Rotation) Level 1 (Yellow)   Flexion Strengthening;Both;20 reps;Weights   Shoulder Flexion Weight (lbs) 1   Extension Strengthening;Right;20 reps;Theraband   Theraband Level (Shoulder Extension) Level 1 (Yellow)   Row Strengthening;Right;20 reps;Theraband   Theraband Level (Shoulder Row) Level 1 (Yellow)   Other Standing Exercises B shoulder scaption 1# x20 reps   Other Standing Exercises Bent R shoulder row, ext 1# x20 reps     Shoulder Exercises: Pulleys  Flexion Other (comment)  x5 min   Other Pulley Exercises UE ranger flex/CW and CCW circles x30 reps each     Modalities   Modalities Electrical Stimulation     Electrical Stimulation   Electrical Stimulation Location R upper arm   Electrical Stimulation Action IFC   Electrical Stimulation Parameters 1-10 hz x15 min   Electrical Stimulation Goals Pain     Manual Therapy   Manual Therapy Passive ROM   Passive ROM PROM of R shoulder into ER with holds at end range                  PT Short Term Goals - 11/05/15 1353      PT SHORT TERM GOAL #1   Title I with HEP   Time 4   Period Weeks   Status Achieved     PT SHORT TERM GOAL #2   Title PROM of R shoulder to  Protocol limits   Time 4   Period Weeks   Status On-going           PT Long Term Goals - 12/17/15 1309      PT LONG TERM GOAL #1   Title R shoulder ROM WFL to perform ADLs   Time 8   Period Weeks   Status On-going     PT LONG TERM GOAL #2   Title R shoulder strength 5/5 to allow RTW   Time 8   Period Weeks   Status On-going     PT LONG TERM GOAL #3   Title Able to peform ADLS with pain 2/10  or less in R shoulder   Time 8   Period Weeks   Status On-going               Plan - 01/07/16 1344    Clinical Impression Statement Patient presented in clinic with continued R distal Bicep discomfort and had a copperfit compression sleeve donned to R elbow. Patient was guided through ROM exercises as introduced to R shoulder strengthening exercises using light resistance such as yellow theraband and 1# handweight. Patient required moderate multimodal cueing for proper exercise technique for all exercises completed today. Patient reported experiencing some discomfort in R posterior shoulder with scaption and supine serratus punches. No other reports of pain were reported today although patient did report difficulty with R shoulder ER with yellow theraband secondary to weakness. Patient's AROM ER and IR for R shoulder improving as R shoulder ER measured as 45 deg and IR 50 deg. Normal modalities response noted following removal of the modalities.   Rehab Potential Excellent   Clinical Impairments Affecting Rehab Potential R biceps tendon repair 2015 current surgery 10/14/15    PT Frequency 3x / week   PT Duration 8 weeks   PT Treatment/Interventions ADLs/Self Care Home Management;Electrical Stimulation;Cryotherapy;Ultrasound;Neuromuscular re-education;Therapeutic exercise;Manual techniques;Scar mobilization;Passive range of motion;Vasopneumatic Device;Patient/family education   PT Next Visit Plan Progress according to protocol in media with modalities per MPT POC. Assess patient's  response to RW4 with yellow theraband and provide HEP.   PT Home Exercise Plan supine ER/IR with cane, pendulums, elbow flex/ext; AAROM flexion, table slides, AAROM ER.   No new order from MD. We will send Recert to cont.   Consulted and Agree with Plan of Care Patient      Patient will benefit from skilled therapeutic intervention in order to improve the following deficits and impairments:  Decreased range of motion, Pain, Impaired UE functional use, Decreased strength  Visit  Diagnosis: Stiffness of right shoulder, not elsewhere classified  Acute pain of right shoulder  Muscle weakness (generalized)     Problem List There are no active problems to display for this patient.   Evelene CroonKelsey M Parsons, PTA 01/07/2016, 2:11 PM  [facility] [address] [place], [place], [address] Phone: [phone]   Fax:  [phone]  Name: Scott Chapman MRN: [id number] Date of Birth: 03-Dec-1960

## 2016-01-08 ENCOUNTER — Encounter: Payer: Self-pay | Admitting: Physical Therapy

## 2016-01-08 ENCOUNTER — Ambulatory Visit: Payer: Worker's Compensation | Admitting: Physical Therapy

## 2016-01-08 DIAGNOSIS — M25611 Stiffness of right shoulder, not elsewhere classified: Secondary | ICD-10-CM | POA: Diagnosis not present

## 2016-01-08 DIAGNOSIS — M6281 Muscle weakness (generalized): Secondary | ICD-10-CM

## 2016-01-08 DIAGNOSIS — M25511 Pain in right shoulder: Secondary | ICD-10-CM

## 2016-01-08 NOTE — Patient Instructions (Signed)
  Strengthening: Resisted Flexion   Hold tubing with left arm at side. Pull forward and up. Move shoulder through pain-free range of motion. Repeat __10__ times per set. Do _2-3___ sets per session. Do _2-3___ sessions per day. http://orth.exer.us/824   Copyright  VHI. All rights reserved.  Strengthening: Resisted Extension   Hold tubing in right hand, arm forward. Pull arm back, elbow straight. Repeat __10__ times per set. Do _2-3___ sets per session. Do _2-3___ sessions per day.   Strengthening: Resisted Internal Rotation   Hold tubing in left hand, elbow at side and forearm out. Rotate forearm in across body. Repeat __10__ times per set. Do _2-3___ sets per session. Do _2-3___ sessions per day.   Strengthening: Resisted External Rotation   Hold tubing in right hand, elbow at side and forearm across body. Rotate forearm out. Repeat __10__ times per set. Do __2-3__ sets per session. Do __2-3__ sessions per day.

## 2016-01-08 NOTE — Therapy (Signed)
[facility] [address] [place], [place], [address] Phone: [phone]   Fax:  [phone]  Physical Therapy Treatment  Patient Details  Name: Scott CockayneJames M Spadafore MRN: [id number] Date of Birth: Jul 07, 1960 Referring Provider: Marciano SequinBryson Stillwell PA-C  Encounter Date: 01/08/2016      PT End of Session - 01/08/16 1354    Visit Number 26   Number of Visits 30   Date for PT Re-Evaluation 02/03/16   PT Start Time 1312   PT Stop Time 1413   PT Time Calculation (min) 61 min   Activity Tolerance Patient tolerated treatment well   Behavior During Therapy [facility] for tasks assessed/performed      Past Medical History:  Diagnosis Date  . Allergy   . Hyperlipidemia   . Hypertension     Past Surgical History:  Procedure Laterality Date  . Colapsed lung Right   . HERNIA REPAIR    . left arm surgery    . TENDON REPAIR Right    arm    There were no vitals filed for this visit.      Subjective Assessment - 01/08/16 1315    Subjective Patient felt good after last treatment, some ongoing soreness with reppeitive movement   Pertinent History HTN, asthma   Currently in Pain? Yes   Pain Score 3    Pain Location Arm   Pain Orientation Right   Pain Descriptors / Indicators Aching   Pain Type Surgical pain   Pain Onset More than a month ago   Pain Frequency Intermittent   Aggravating Factors  work activities   Pain Relieving Factors rest             [facility] PT Assessment - 01/08/16 0001      AROM   AROM Assessment Site Shoulder   Right/Left Shoulder Right   Right Shoulder Internal Rotation 120 Degrees   Right Shoulder External Rotation 65 Degrees     PROM   PROM Assessment Site Shoulder   Right Shoulder External Rotation 70 Degrees                     [facility] Adult PT Treatment/Exercise - 01/08/16 0001      Shoulder Exercises: Prone   Other Prone Exercises kneeling for rows, horiz abd and ext 2# 3x10 each     Shoulder  Exercises: Sidelying   External Rotation Strengthening;Right;20 reps;Weights   External Rotation Weight (lbs) 2     Shoulder Exercises: Standing   External Rotation Strengthening;Right;20 reps;Theraband   Theraband Level (Shoulder External Rotation) Level 1 (Yellow)   Internal Rotation Strengthening;Right;20 reps;Theraband   Theraband Level (Shoulder Internal Rotation) Level 1 (Yellow)   Flexion Strengthening;Right;Weights  scaption 3x10   Shoulder Flexion Weight (lbs) 1   Extension Strengthening;Right;20 reps;Theraband   Theraband Level (Shoulder Extension) Level 1 (Yellow)   Row Strengthening;Right;20 reps;Theraband   Theraband Level (Shoulder Row) Level 1 (Yellow)     Shoulder Exercises: Pulleys   Flexion Other (comment)  5min   Other Pulley Exercises UE ranger flex/CW and CCW circles x30 reps each     Shoulder Exercises: Stretch   Other Shoulder Stretches door stretch for ER x5     Electrical Stimulation   Electrical Stimulation Location R upper arm   Electrical Stimulation Action IFC   Electrical Stimulation Parameters 1-10hz  x415min   Electrical Stimulation Goals Pain     Manual Therapy   Manual Therapy Passive ROM   Passive ROM PROM for right shoulder  flexion/IR/ER and ant cap stretch with gentle holds                PT Education - 01/08/16 1350    Education provided Yes   Education Details HEP RW4 with yellow t-band   Person(s) Educated Patient   Methods Explanation;Demonstration;Handout   Comprehension Verbalized understanding;Returned demonstration          PT Short Term Goals - 11/05/15 1353      PT SHORT TERM GOAL #1   Title I with HEP   Time 4   Period Weeks   Status Achieved     PT SHORT TERM GOAL #2   Title PROM of R shoulder to Protocol limits   Time 4   Period Weeks   Status On-going           PT Long Term Goals - 12/17/15 1309      PT LONG TERM GOAL #1   Title R shoulder ROM WFL to perform ADLs   Time 8   Period Weeks    Status On-going     PT LONG TERM GOAL #2   Title R shoulder strength 5/5 to allow RTW   Time 8   Period Weeks   Status On-going     PT LONG TERM GOAL #3   Title Able to peform ADLS with pain 2/10  or less in R shoulder   Time 8   Period Weeks   Status On-going               Plan - 01/08/16 1358    Clinical Impression Statement Patient tolerated treatment well today. Patient able to progress strengthening with some mild difficulty due to weakness in right shoulder. Patient has reported improvement with light ADL's yet unable to perform any heavy activites or heavy ADL's. Patient improved with ROM today. Patient current goals ongoing due to strength, pain and full AROM deficts.   Rehab Potential Excellent   Clinical Impairments Affecting Rehab Potential R biceps tendon repair 2015 current surgery 10/14/15    PT Frequency 3x / week   PT Duration 8 weeks   PT Treatment/Interventions ADLs/Self Care Home Management;Electrical Stimulation;Cryotherapy;Ultrasound;Neuromuscular re-education;Therapeutic exercise;Manual techniques;Scar mobilization;Passive range of motion;Vasopneumatic Device;Patient/family education   PT Next Visit Plan Progress according to protocol in media with modalities per MPT POC (MD. Thomasena Edisollins 01/26/16)   Consulted and Agree with Plan of Care Patient      Patient will benefit from skilled therapeutic intervention in order to improve the following deficits and impairments:  Decreased range of motion, Pain, Impaired UE functional use, Decreased strength  Visit Diagnosis: Stiffness of right shoulder, not elsewhere classified  Acute pain of right shoulder  Muscle weakness (generalized)     Problem List There are no active problems to display for this patient.   Hermelinda DellenDUNFORD, Davelle Anselmi P, PTA 01/08/2016, 2:16 PM  [facility] [address] [place], [place], [address] Phone: [phone]   Fax:  [phone]  Name:  Scott CockayneJames M Molla MRN: [id number] Date of Birth: [birth year]

## 2016-01-13 ENCOUNTER — Ambulatory Visit: Payer: Worker's Compensation | Attending: Orthopedic Surgery | Admitting: *Deleted

## 2016-01-13 DIAGNOSIS — M25511 Pain in right shoulder: Secondary | ICD-10-CM | POA: Diagnosis present

## 2016-01-13 DIAGNOSIS — M25611 Stiffness of right shoulder, not elsewhere classified: Secondary | ICD-10-CM | POA: Diagnosis not present

## 2016-01-13 DIAGNOSIS — M6281 Muscle weakness (generalized): Secondary | ICD-10-CM

## 2016-01-13 NOTE — Therapy (Signed)
[facility] [address] [place], [place], [address] Phone: [phone]   Fax:  [phone]  Physical Therapy Treatment  Patient Details  Name: Scott CockayneJames M Chapman MRN: [id number] Date of Birth: 09/20/1960 Referring Provider: Marciano SequinBryson Stillwell PA-C  Encounter Date: 01/13/2016    Past Medical History:  Diagnosis Date  . Allergy   . Hyperlipidemia   . Hypertension     Past Surgical History:  Procedure Laterality Date  . Colapsed lung Right   . HERNIA REPAIR    . left arm surgery    . TENDON REPAIR Right    arm    There were no vitals filed for this visit.      Subjective Assessment - 01/13/16 1323    Subjective Patient felt good after last treatment, some ongoing soreness with reppeitive movement   Pertinent History HTN, asthma   Currently in Pain? Yes   Pain Score 1    Pain Location Arm   Pain Orientation Right   Pain Descriptors / Indicators Aching   Pain Type Surgical pain   Pain Onset More than a month ago   Pain Frequency Intermittent                         [facility] Adult PT Treatment/Exercise - 01/13/16 0001      Shoulder Exercises: Prone   Other Prone Exercises kneeling for rows, horiz abd and ext 2# 3x10 each     Shoulder Exercises: Sidelying   External Rotation Strengthening;Right;20 reps;Weights   External Rotation Weight (lbs) 1   Flexion AROM;Right  3x10 reps 1#     Shoulder Exercises: Standing   External Rotation Strengthening;Right;20 reps;Theraband   Theraband Level (Shoulder External Rotation) Level 1 (Yellow)   Internal Rotation Strengthening;Right;20 reps;Theraband   Theraband Level (Shoulder Internal Rotation) Level 1 (Yellow)   Flexion Strengthening;Right;Weights  scaption 3x10   Theraband Level (Shoulder Flexion) Level 1 (Yellow)   Extension Strengthening;Right;20 reps;Theraband   Theraband Level (Shoulder Extension) Level 1 (Yellow)   Row Strengthening;Right;20 reps;Theraband   Theraband Level (Shoulder Row) Level 1 (Yellow)     Shoulder Exercises: Pulleys   Flexion Other (comment)  5min   Other Pulley Exercises UE ranger flex/CW and CCW circles x30 reps each     Shoulder Exercises: ROM/Strengthening   UBE (Upper Arm Bike) UBE 120 RPM x6 min with LUE leading RUE forward only     Electrical Stimulation   Electrical Stimulation Location R Deltoids   IFC x 15 mins 80-150hz    Electrical Stimulation Goals Pain     Manual Therapy   Manual Therapy --   Passive ROM --                  PT Short Term Goals - 11/05/15 1353      PT SHORT TERM GOAL #1   Title I with HEP   Time 4   Period Weeks   Status Achieved     PT SHORT TERM GOAL #2   Title PROM of R shoulder to Protocol limits   Time 4   Period Weeks   Status On-going           PT Long Term Goals - 12/17/15 1309      PT LONG TERM GOAL #1   Title R shoulder ROM WFL to perform ADLs   Time 8   Period Weeks   Status On-going     PT LONG TERM GOAL #2   Title  R shoulder strength 5/5 to allow RTW   Time 8   Period Weeks   Status On-going     PT LONG TERM GOAL #3   Title Able to peform ADLS with pain 2/10  or less in R shoulder   Time 8   Period Weeks   Status On-going             Patient will benefit from skilled therapeutic intervention in order to improve the following deficits and impairments:     Visit Diagnosis: Stiffness of right shoulder, not elsewhere classified  Acute pain of right shoulder  Muscle weakness (generalized)     Problem List There are no active problems to display for this patient.   Delani Kohli,CHRIS, PTA 01/13/2016, 5:25 PM  [facility] [address] [place], [place], [address] Phone: [phone]   Fax:  [phone]  Name: Scott Chapman MRN: [id number] Date of Birth: 12-29-1960

## 2016-01-14 ENCOUNTER — Encounter: Payer: Self-pay | Admitting: Physical Therapy

## 2016-01-15 ENCOUNTER — Ambulatory Visit: Payer: Worker's Compensation | Attending: Orthopedic Surgery | Admitting: *Deleted

## 2016-01-15 ENCOUNTER — Encounter: Payer: Self-pay | Admitting: *Deleted

## 2016-01-15 DIAGNOSIS — M6281 Muscle weakness (generalized): Secondary | ICD-10-CM | POA: Diagnosis present

## 2016-01-15 DIAGNOSIS — M25511 Pain in right shoulder: Secondary | ICD-10-CM | POA: Diagnosis present

## 2016-01-15 DIAGNOSIS — M25611 Stiffness of right shoulder, not elsewhere classified: Secondary | ICD-10-CM | POA: Diagnosis not present

## 2016-01-15 NOTE — Therapy (Signed)
[facility] [address] [place], [place], [address] Phone: [phone]   Fax:  [phone]  Physical Therapy Treatment  Patient Details  Name: Scott CockayneJames M Chapman MRN: [id number] Date of Birth: 11/26/1960 Referring Provider: Marciano SequinBryson Stillwell PA-C  Encounter Date: 01/15/2016      PT End of Session - 01/15/16 1709    Visit Number 27   Number of Visits 30   Date for PT Re-Evaluation 02/03/16   PT Start Time 1615   PT Stop Time 1715   PT Time Calculation (min) 60 min      Past Medical History:  Diagnosis Date  . Allergy   . Hyperlipidemia   . Hypertension     Past Surgical History:  Procedure Laterality Date  . Colapsed lung Right   . HERNIA REPAIR    . left arm surgery    . TENDON REPAIR Right    arm    There were no vitals filed for this visit.                       [facility] Adult PT Treatment/Exercise - 01/15/16 0001      Shoulder Exercises: Prone   Other Prone Exercises kneeling for rows, horiz abd and ext 2# 3x10 each     Shoulder Exercises: Sidelying   External Rotation Strengthening;Right;20 reps;Weights   External Rotation Weight (lbs) 1   Flexion AROM;Strengthening  3x10 reps 1#     Shoulder Exercises: Standing   External Rotation Strengthening;Right;20 reps;Theraband   Theraband Level (Shoulder External Rotation) Level 1 (Yellow)   Internal Rotation --   Theraband Level (Shoulder Internal Rotation) --   Flexion --   Theraband Level (Shoulder Flexion) --   Extension Strengthening;Right;20 reps;Theraband   Theraband Level (Shoulder Extension) Level 1 (Yellow)   Row --   Theraband Level (Shoulder Row) --     Shoulder Exercises: Pulleys   Flexion Other (comment)  5min     Shoulder Exercises: ROM/Strengthening   UBE (Upper Arm Bike) UBE 120 RPM x6 min with LUE leading RUE forward only     Modalities   Modalities Electrical Stimulation     Electrical Stimulation   Electrical Stimulation  Location R Deltoids   IFC x 15 mins 80-150hz    Electrical Stimulation Goals Pain     Manual Therapy   Manual Therapy Passive ROM   Passive ROM PROM for right shoulder flexion/IR/ER and ant cap stretch with end-range holds. Contract/relax used for flexion ROM                  PT Short Term Goals - 11/05/15 1353      PT SHORT TERM GOAL #1   Title I with HEP   Time 4   Period Weeks   Status Achieved     PT SHORT TERM GOAL #2   Title PROM of R shoulder to Protocol limits   Time 4   Period Weeks   Status On-going           PT Long Term Goals - 12/17/15 1309      PT LONG TERM GOAL #1   Title R shoulder ROM WFL to perform ADLs   Time 8   Period Weeks   Status On-going     PT LONG TERM GOAL #2   Title R shoulder strength 5/5 to allow RTW   Time 8   Period Weeks   Status On-going     PT LONG TERM GOAL #  3   Title Able to peform ADLS with pain 2/10  or less in R shoulder   Time 8   Period Weeks   Status On-going               Plan - 01/15/16 1710    Clinical Impression Statement Pt arrived to clinic today with some increased soreness RT shldr. He was able to perform all Therex today with minimal pain increase and mainly fatigue. His ROM for flexion was less today  before PROM it was at 155 Degrees , but reached 160 degrees by the end of Rx. normal response to modalities today   Rehab Potential Excellent   Clinical Impairments Affecting Rehab Potential R biceps tendon repair 2015 current surgery 10/14/15    PT Frequency 3x / week   PT Duration 8 weeks   PT Treatment/Interventions ADLs/Self Care Home Management;Electrical Stimulation;Cryotherapy;Ultrasound;Neuromuscular re-education;Therapeutic exercise;Manual techniques;Scar mobilization;Passive range of motion;Vasopneumatic Device;Patient/family education   PT Next Visit Plan Progress according to protocol in media with modalities per MPT POC (MD. Thomasena Edis 01/26/16)   PT Home Exercise Plan supine ER/IR  with cane, pendulums, elbow flex/ext; AAROM flexion, table slides, AAROM ER.   No new order from MD. We will send Recert to cont.   Consulted and Agree with Plan of Care Patient      Patient will benefit from skilled therapeutic intervention in order to improve the following deficits and impairments:  Decreased range of motion, Pain, Impaired UE functional use, Decreased strength  Visit Diagnosis: Stiffness of right shoulder, not elsewhere classified  Acute pain of right shoulder  Muscle weakness (generalized)     Problem List There are no active problems to display for this patient.   Red Mandt,CHRIS, PTA 01/15/2016, 5:16 PM  [facility] [address] [place], [place], [address] Phone: [phone]   Fax:  [phone]  Name: Scott Chapman MRN: [id number] Date of Birth: 12/03/1960

## 2016-01-19 ENCOUNTER — Encounter: Payer: Self-pay | Admitting: Physical Therapy

## 2016-01-20 ENCOUNTER — Encounter: Payer: Self-pay | Admitting: Physical Therapy

## 2016-01-20 ENCOUNTER — Ambulatory Visit: Payer: Worker's Compensation | Admitting: *Deleted

## 2016-01-20 DIAGNOSIS — M25611 Stiffness of right shoulder, not elsewhere classified: Secondary | ICD-10-CM

## 2016-01-20 DIAGNOSIS — M25511 Pain in right shoulder: Secondary | ICD-10-CM

## 2016-01-20 DIAGNOSIS — M6281 Muscle weakness (generalized): Secondary | ICD-10-CM

## 2016-01-20 NOTE — Therapy (Signed)
[facility] [address] [place], [place], [address] Phone: [phone]   Fax:  [phone]  Physical Therapy Treatment  Patient Details  Name: Scott Chapman MRN: [id number] Date of Birth: 1960-12-20 Referring Provider: Marciano Sequin PA-C  Encounter Date: 01/20/2016      PT End of Session - 01/20/16 1700    Visit Number 28   Number of Visits 30   Date for PT Re-Evaluation 02/03/16   PT Start Time 1645   PT Stop Time 1745   PT Time Calculation (min) 60 min      Past Medical History:  Diagnosis Date  . Allergy   . Hyperlipidemia   . Hypertension     Past Surgical History:  Procedure Laterality Date  . Colapsed lung Right   . HERNIA REPAIR    . left arm surgery    . TENDON REPAIR Right    arm    There were no vitals filed for this visit.      Subjective Assessment - 01/20/16 1659    Subjective Patient felt good after last treatment, some ongoing soreness with reppeitive movement                         [facility] Adult PT Treatment/Exercise - 01/20/16 0001      Elbow Exercises   Elbow Flexion Strengthening;Right;20 reps;10 reps     Shoulder Exercises: Prone   Other Prone Exercises kneeling for rows, horiz abd and ext 2# 3x10 each     Shoulder Exercises: Sidelying   External Rotation Strengthening;Right;20 reps;Weights   External Rotation Weight (lbs) 1   Flexion AROM;Strengthening  3x10 reps 1#     Shoulder Exercises: Standing   External Rotation --   Theraband Level (Shoulder External Rotation) --   Flexion Strengthening;Right;Weights  scaption 3x10   Extension --   Theraband Level (Shoulder Extension) --     Shoulder Exercises: Pulleys   Flexion Other (comment)    Other Pulley Exercises UE ranger flex/CW and CCW circles x30 reps each     Shoulder Exercises: ROM/Strengthening   UBE (Upper Arm Bike) UBE 120 RPM x6 min with LUE leading RUE forward only     Modalities   Modalities  Electrical Stimulation     Electrical Stimulation   Electrical Stimulation Location R Deltoids   IFC x 15 mins 80-150hz    Electrical Stimulation Goals Pain     Manual Therapy   Manual Therapy Passive ROM   Passive ROM PROM for right shoulder flexion/IR/ER and ant cap stretch with end-range holds. Contract/relax used for flexion ROM                  PT Short Term Goals - 11/05/15 1353      PT SHORT TERM GOAL #1   Title I with HEP   Time 4   Period Weeks   Status Achieved     PT SHORT TERM GOAL #2   Title PROM of R shoulder to Protocol limits   Time 4   Period Weeks   Status On-going           PT Long Term Goals - 12/17/15 1309      PT LONG TERM GOAL #1   Title R shoulder ROM WFL to perform ADLs   Time 8   Period Weeks   Status On-going     PT LONG TERM GOAL #2   Title R shoulder strength 5/5 to allow  RTW   Time 8   Period Weeks   Status On-going     PT LONG TERM GOAL #3   Title Able to peform ADLS with pain 2/10  or less in R shoulder   Time 8   Period Weeks   Status On-going               Plan - 01/20/16 1704    Clinical Impression Statement Pt arrived to clinic with soreness and tightness RT shldr. He was able to complete therex and act.'s for RT shldr with minimal pain increase mainly fatigue. His ROM was improved from last time to 158 degrees of flexion today. ER ROM to 80 at 80 degrees abduction. Normal modality response.   Rehab Potential Excellent   Clinical Impairments Affecting Rehab Potential R biceps tendon repair 2015 current surgery 10/14/15    PT Frequency 3x / week   PT Duration 8 weeks   PT Treatment/Interventions ADLs/Self Care Home Management;Electrical Stimulation;Cryotherapy;Ultrasound;Neuromuscular re-education;Therapeutic exercise;Manual techniques;Scar mobilization;Passive range of motion;Vasopneumatic Device;Patient/family education   PT Next Visit Plan Progress according to protocol in media with modalities per MPT  POC (MD. Scott Chapman 01/26/16)   PT Home Exercise Plan supine ER/IR with cane, pendulums, elbow flex/ext; AAROM flexion, table slides, AAROM ER.   No new order from MD. We will send Recert to cont.   MD note next visit   Consulted and Agree with Plan of Care Patient      Patient will benefit from skilled therapeutic intervention in order to improve the following deficits and impairments:  Decreased range of motion, Pain, Impaired UE functional use, Decreased strength  Visit Diagnosis: Stiffness of right shoulder, not elsewhere classified  Acute pain of right shoulder  Muscle weakness (generalized)     Problem List There are no active problems to display for this patient.   Scott Chapman,Scott Chapman, PTA 01/20/2016, 5:49 PM  [facility] [address] [place], [place], [address] Phone: [phone]   Fax:  [phone]  Name: Scott Chapman MRN: [id number] Date of Birth: 01-Dec-1960

## 2016-01-21 ENCOUNTER — Encounter: Payer: Self-pay | Admitting: Physical Therapy

## 2016-01-22 ENCOUNTER — Encounter: Payer: Self-pay | Admitting: *Deleted

## 2016-01-22 ENCOUNTER — Ambulatory Visit: Payer: Worker's Compensation | Admitting: *Deleted

## 2016-01-22 DIAGNOSIS — M25511 Pain in right shoulder: Secondary | ICD-10-CM

## 2016-01-22 DIAGNOSIS — M25611 Stiffness of right shoulder, not elsewhere classified: Secondary | ICD-10-CM

## 2016-01-22 DIAGNOSIS — M6281 Muscle weakness (generalized): Secondary | ICD-10-CM

## 2016-01-22 NOTE — Therapy (Signed)
[facility] [address] [place], [place], [address] Phone: [phone]   Fax:  [phone]  Physical Therapy Treatment  Patient Details  Name: HERSHALL BENKERT MRN: [id number] Date of Birth: [birth year] Referring Provider: Marciano Sequin PA-C  Encounter Date: 01/22/2016      PT End of Session - 01/22/16 1657    Visit Number 29   Number of Visits 30   Date for PT Re-Evaluation 02/03/16   PT Start Time 1645   PT Stop Time 1746   PT Time Calculation (min) 61 min      Past Medical History:  Diagnosis Date  . Allergy   . Hyperlipidemia   . Hypertension     Past Surgical History:  Procedure Laterality Date  . Colapsed lung Right   . HERNIA REPAIR    . left arm surgery    . TENDON REPAIR Right    arm    There were no vitals filed for this visit.      Subjective Assessment - 01/22/16 1654    Subjective Patient felt good after last treatment, some ongoing soreness with reppeitive movement. 3-4/10   Pertinent History HTN, asthma   Currently in Pain? Yes   Pain Score 3    Pain Location Arm   Pain Orientation Right   Pain Onset More than a month ago                                   PT Short Term Goals - 11/05/15 1353      PT SHORT TERM GOAL #1   Title I with HEP   Time 4   Period Weeks   Status Achieved     PT SHORT TERM GOAL #2   Title PROM of R shoulder to Protocol limits   Time 4   Period Weeks   Status On-going           PT Long Term Goals - 12/17/15 1309      PT LONG TERM GOAL #1   Title R shoulder ROM WFL to perform ADLs   Time 8   Period Weeks   Status On-going     PT LONG TERM GOAL #2   Title R shoulder strength 5/5 to allow RTW   Time 8   Period Weeks   Status On-going     PT LONG TERM GOAL #3   Title Able to peform ADLS with pain 2/10  or less in R shoulder   Time 8   Period Weeks   Status On-going               Plan - 01/22/16 1657    Clinical  Impression Statement Pt arrived to clinic today with 3/10 soreness RT shldr. He was able to complete therex  and act.'s with minimal pain increase and mainly fatigue. His active flexion ROM was 150 degreesand ER to 60 degrees. PROM for Flexion  was 155 degrees, ER 75 and IR to 60 degrees. He did well with therex and is progressing, but continues to have some ROM deficits due to tightness.   Rehab Potential Excellent   Clinical Impairments Affecting Rehab Potential R biceps tendon repair 2015 current surgery 10/14/15    PT Frequency 3x / week   PT Duration 8 weeks   PT Treatment/Interventions ADLs/Self Care Home Management;Electrical Stimulation;Cryotherapy;Ultrasound;Neuromuscular re-education;Therapeutic exercise;Manual techniques;Scar mobilization;Passive range of motion;Vasopneumatic Device;Patient/family education   PT Next  Visit Plan Progress according to protocol in media with modalities per MPT POC (MD. Thomasena Edisollins 01/26/16)    Send MD progress note   PT Home Exercise Plan supine ER/IR with cane, pendulums, elbow flex/ext; AAROM flexion, table slides, AAROM ER.   No new order from MD. We will send Recert to cont.   MD note next visit   Consulted and Agree with Plan of Care Patient      Patient will benefit from skilled therapeutic intervention in order to improve the following deficits and impairments:  Decreased range of motion, Pain, Impaired UE functional use, Decreased strength  Visit Diagnosis: Stiffness of right shoulder, not elsewhere classified  Acute pain of right shoulder  Muscle weakness (generalized)     Problem List There are no active problems to display for this patient.   APPLEGATE, ItalyHAD, PTA 01/23/2016, 11:40 AM [place]had Applegate MPT  [facility] [address] [place], [place], [address] Phone: [phone]   Fax:  [phone]  Name: Amanda CockayneJames M Pullin MRN: [id number] Date of Birth: 1960/05/04

## 2016-01-27 ENCOUNTER — Ambulatory Visit: Payer: Worker's Compensation | Admitting: *Deleted

## 2016-01-29 ENCOUNTER — Encounter: Payer: Self-pay | Admitting: *Deleted

## 2016-02-03 ENCOUNTER — Ambulatory Visit: Payer: Worker's Compensation | Admitting: *Deleted

## 2016-02-03 DIAGNOSIS — M6281 Muscle weakness (generalized): Secondary | ICD-10-CM

## 2016-02-03 DIAGNOSIS — M25511 Pain in right shoulder: Secondary | ICD-10-CM

## 2016-02-03 DIAGNOSIS — M25611 Stiffness of right shoulder, not elsewhere classified: Secondary | ICD-10-CM

## 2016-02-03 NOTE — Therapy (Signed)
[facility] [address] [place], [place], [address] Phone: [phone]   Fax:  [phone]  Physical Therapy Treatment  Patient Details  Name: Scott Chapman MRN: [id number] Date of Birth: 06/04/1960 Referring Provider: Marciano Sequin PA-C  Encounter Date: 02/03/2016      PT End of Session - 02/03/16 1704    Visit Number 30   Number of Visits 42   Date for PT Re-Evaluation 03/05/16   PT Start Time 1645   PT Stop Time 1747   PT Time Calculation (min) 62 min   Activity Tolerance Patient tolerated treatment well   Behavior During Therapy [facility] for tasks assessed/performed      Past Medical History:  Diagnosis Date  . Allergy   . Hyperlipidemia   . Hypertension     Past Surgical History:  Procedure Laterality Date  . Colapsed lung Right   . HERNIA REPAIR    . left arm surgery    . TENDON REPAIR Right    arm    There were no vitals filed for this visit.      Subjective Assessment - 02/03/16 1700    Subjective New Order to continue PT.  Decreased tightness today   Pertinent History HTN, asthma   Currently in Pain? Yes   Pain Score 2    Pain Location Arm   Pain Orientation Right   Pain Descriptors / Indicators Aching   Pain Type Surgical pain   Pain Onset More than a month ago   Pain Frequency Intermittent                         [facility] Adult PT Treatment/Exercise - 02/03/16 0001      Elbow Exercises   Elbow Flexion Strengthening;Right;20 reps;10 reps     Shoulder Exercises: Supine   Protraction Strengthening;Right;10 reps;20 reps   Protraction Weight (lbs) 1#   Other Supine Exercises circles at 100 degrees flexion 1# 3x10 each way     Shoulder Exercises: Prone   Other Prone Exercises kneeling for rows, and ext 3# 3x10 each, Horizontal abd 2# 3x10     Shoulder Exercises: Sidelying   External Rotation Strengthening;Right;20 reps;Weights   External Rotation Weight (lbs) 1   Flexion  AROM;Strengthening  3x10 reps 1#     Shoulder Exercises: Standing   Flexion Strengthening;Right;Weights  scaption 3x10 1#     Shoulder Exercises: Pulleys   Flexion Other (comment)    Other Pulley Exercises UE ranger flex/CW and CCW circles x30 reps each     Shoulder Exercises: ROM/Strengthening   UBE (Upper Arm Bike) UBE 120 RPM x8 min with LUE leading RUE forward only     Modalities   Modalities Electrical Stimulation     Electrical Stimulation   Electrical Stimulation Location R Deltoids   IFC x 15 mins 80-150hz    Electrical Stimulation Goals Pain     Manual Therapy   Manual Therapy Passive ROM   Passive ROM PROM for right shoulder flexion/IR/ER and ant cap stretch with end-range holds. Contract/relax used for flexion ROM                  PT Short Term Goals - 11/05/15 1353      PT SHORT TERM GOAL #1   Title I with HEP   Time 4   Period Weeks   Status Achieved     PT SHORT TERM GOAL #2   Title PROM of R shoulder to  Protocol limits   Time 4   Period Weeks   Status On-going           PT Long Term Goals - 12/17/15 1309      PT LONG TERM GOAL #1   Title R shoulder ROM WFL to perform ADLs   Time 8   Period Weeks   Status On-going     PT LONG TERM GOAL #2   Title R shoulder strength 5/5 to allow RTW   Time 8   Period Weeks   Status On-going     PT LONG TERM GOAL #3   Title Able to peform ADLS with pain 2/10  or less in R shoulder   Time 8   Period Weeks   Status On-going               Plan - 02/03/16 1738    Clinical Impression Statement Pt  has new MD note to continue PT. Pt did fairly well today with PRE's and increased ROM for flexion to 160 degrees and ER to 78 degrees.   Rehab Potential Excellent   Clinical Impairments Affecting Rehab Potential R biceps tendon repair 2015 current surgery 10/14/15    PT Frequency 3x / week   PT Duration 8 weeks   PT Treatment/Interventions ADLs/Self Care Home Management;Electrical  Stimulation;Cryotherapy;Ultrasound;Neuromuscular re-education;Therapeutic exercise;Manual techniques;Scar mobilization;Passive range of motion;Vasopneumatic Device;Patient/family education   PT Next Visit Plan Progress according to protocol in media with modalities per MPT POC (MD. Thomasena Edisollins 01/26/16)    New MD note to continue PT      Patient will benefit from skilled therapeutic intervention in order to improve the following deficits and impairments:  Decreased range of motion, Pain, Impaired UE functional use, Decreased strength  Visit Diagnosis: Stiffness of right shoulder, not elsewhere classified  Acute pain of right shoulder  Muscle weakness (generalized)     Problem List There are no active problems to display for this patient.   Gratia Disla,CHRIS, PTA 02/03/2016, 5:47 PM  [facility] [address] [place], [place], [address] Phone: [phone]   Fax:  [phone]  Name: Scott Chapman MRN: [id number] Date of Birth: 11-15-1960

## 2016-02-05 ENCOUNTER — Encounter: Payer: Self-pay | Admitting: *Deleted

## 2016-02-10 ENCOUNTER — Ambulatory Visit: Payer: Worker's Compensation | Admitting: *Deleted

## 2016-02-10 DIAGNOSIS — M25611 Stiffness of right shoulder, not elsewhere classified: Secondary | ICD-10-CM | POA: Diagnosis not present

## 2016-02-10 DIAGNOSIS — M6281 Muscle weakness (generalized): Secondary | ICD-10-CM

## 2016-02-10 DIAGNOSIS — M25511 Pain in right shoulder: Secondary | ICD-10-CM

## 2016-02-10 NOTE — Therapy (Signed)
[facility] [place], [place], [address] Phone: [phone]   Fax:  [phone]  Physical Therapy Treatment  Patient Details  Name: NICKLOS GAXIOLA MRN: [id number] Date of Birth: [birth year] Referring Provider: Jerilynn Mages PA-C  Encounter Date: 02/10/2016      PT End of Session - 02/10/16 1800    Visit Number 31   Number of Visits 42   Date for PT Re-Evaluation 03/05/16   PT Start Time 3235   PT Stop Time 5732   PT Time Calculation (min) 55 min      Past Medical History:  Diagnosis Date  . Allergy   . Hyperlipidemia   . Hypertension     Past Surgical History:  Procedure Laterality Date  . Colapsed lung Right   . HERNIA REPAIR    . left arm surgery    . TENDON REPAIR Right    arm    There were no vitals filed for this visit.      Subjective Assessment - 02/10/16 1655    Subjective New Order to continue PT.  Decreased tightness today   Pertinent History HTN, asthma   Currently in Pain? Yes   Pain Score 2    Pain Orientation Right   Pain Descriptors / Indicators Aching   Pain Type Surgical pain   Pain Onset More than a month ago   Pain Frequency Intermittent                         [facility] Adult PT Treatment/Exercise - 02/10/16 0001      Elbow Exercises   Elbow Flexion Strengthening;Right;20 reps;10 reps  3# 3x10     Shoulder Exercises: Supine   Protraction Strengthening;Right;10 reps;20 reps   Protraction Weight (lbs) 2#  shldr level   Other Supine Exercises circles at 100 degrees flexion 2# 3x10 each way     Shoulder Exercises: Prone   Other Prone Exercises kneeling for rows, and ext 3# 3x10 each, Horizontal abd 2# 3x10     Shoulder Exercises: Sidelying   External Rotation Strengthening;Right;20 reps;Weights   External Rotation Weight (lbs) 2   Flexion --  3x10 reps 1#     Shoulder Exercises: Standing   Flexion Strengthening;Right;Weights  scaption 3x10 2#     Shoulder Exercises: Pulleys   Flexion Other (comment)  40mn   Other Pulley Exercises UE ranger flex/CW and CCW circles x30 reps each     Shoulder Exercises: ROM/Strengthening   UBE (Upper Arm Bike) UBE 90 RPM x10  min with LUE leading RUE forward only     Modalities   Modalities --     EAcupuncturistLocation --   Electrical Stimulation Goals --     Manual Therapy   Manual Therapy Passive ROM   Passive ROM PROM for right shoulder flexion/IR/ER and ant cap stretch with end-range holds. Contract/relax used for flexion ROM                  PT Short Term Goals - 11/05/15 1353      PT SHORT TERM GOAL #1   Title I with HEP   Time 4   Period Weeks   Status Achieved     PT SHORT TERM GOAL #2   Title PROM of R shoulder to Protocol limits   Time 4   Period Weeks   Status On-going           PT  Long Term Goals - 12/17/15 1309      PT LONG TERM GOAL #1   Title R shoulder ROM WFL to perform ADLs   Time 8   Period Weeks   Status On-going     PT LONG TERM GOAL #2   Title R shoulder strength 5/5 to allow RTW   Time 8   Period Weeks   Status On-going     PT LONG TERM GOAL #3   Title Able to peform ADLS with pain 0/17  or less in R shoulder   Time 8   Period Weeks   Status On-going               Plan - 02/10/16 1755    Clinical Impression Statement Pt did fairly well today and was able to perform therex with low pain levels. His ROM was at 160 degrees again for flexion and 80 for ER. His IR was at 75 degrees. Progressing well so far. No new  LTGs  met today due to deficits.   Rehab Potential Excellent   Clinical Impairments Affecting Rehab Potential R biceps tendon repair 2015 current surgery 10/14/15    PT Frequency 3x / week   PT Duration 8 weeks   PT Treatment/Interventions ADLs/Self Care Home Management;Electrical Stimulation;Cryotherapy;Ultrasound;Neuromuscular re-education;Therapeutic exercise;Manual  techniques;Scar mobilization;Passive range of motion;Vasopneumatic Device;Patient/family education   PT Next Visit Plan Progress according to protocol in media with modalities per MPT POC (MD. Theda Sers 01/26/16)    New MD note to continue PT   PT Home Exercise Plan supine ER/IR with cane, pendulums, elbow flex/ext; AAROM flexion, table slides, AAROM ER.   No new order from MD. We will send Recert to cont.   MD note next visit   Consulted and Agree with Plan of Care Patient      Patient will benefit from skilled therapeutic intervention in order to improve the following deficits and impairments:  Decreased range of motion, Pain, Impaired UE functional use, Decreased strength  Visit Diagnosis: Stiffness of right shoulder, not elsewhere classified  Acute pain of right shoulder  Muscle weakness (generalized)     Problem List There are no active problems to display for this patient.   RAMSEUR,CHRIS, PTA 02/10/2016, 6:06 PM  [facility] [address] [place], [place], [address] Phone: [phone]   Fax:  [phone]  Name: KYMARI NUON MRN: [id number] Date of Birth: 25-Jan-1960

## 2016-02-12 ENCOUNTER — Ambulatory Visit: Payer: Worker's Compensation | Attending: Orthopedic Surgery | Admitting: *Deleted

## 2016-02-12 DIAGNOSIS — M6281 Muscle weakness (generalized): Secondary | ICD-10-CM | POA: Diagnosis present

## 2016-02-12 DIAGNOSIS — M25511 Pain in right shoulder: Secondary | ICD-10-CM | POA: Insufficient documentation

## 2016-02-12 DIAGNOSIS — M25611 Stiffness of right shoulder, not elsewhere classified: Secondary | ICD-10-CM | POA: Diagnosis not present

## 2016-02-12 NOTE — Therapy (Signed)
[facility] [address] [place], [place], [address] Phone: [phone]   Fax:  [phone]  Physical Therapy Treatment  Patient Details  Name: Scott Chapman MRN: [id number] Date of Birth: Sep 11, 1960 Referring Provider: Marciano Sequin PA-C  Encounter Date: 02/12/2016      PT End of Session - 02/12/16 1811    Visit Number 32   Number of Visits 42   Date for PT Re-Evaluation 03/05/16   PT Start Time 1645   PT Stop Time 1736   PT Time Calculation (min) 51 min      Past Medical History:  Diagnosis Date  . Allergy   . Hyperlipidemia   . Hypertension     Past Surgical History:  Procedure Laterality Date  . Colapsed lung Right   . HERNIA REPAIR    . left arm surgery    . TENDON REPAIR Right    arm    There were no vitals filed for this visit.      Subjective Assessment - 02/12/16 1700    Subjective New Order to continue PT.  Decreased tightness today   Pertinent History HTN, asthma   Currently in Pain? Yes   Pain Score 2    Pain Orientation Right   Pain Descriptors / Indicators Aching   Pain Type Surgical pain   Pain Onset More than a month ago                         [facility] Adult PT Treatment/Exercise - 02/12/16 0001      Elbow Exercises   Elbow Flexion Strengthening;Right;20 reps;10 reps  3# 3x10     Shoulder Exercises: Supine   Protraction Strengthening;Right;10 reps;20 reps   Protraction Weight (lbs) 2#  shldr level   Other Supine Exercises circles at 100 degrees flexion 2# 3x10 each way     Shoulder Exercises: Prone   Other Prone Exercises kneeling for rows, and ext 3# 3x10 each, Horizontal abd 2# 3x10     Shoulder Exercises: Sidelying   External Rotation Strengthening;Right;20 reps;Weights   External Rotation Weight (lbs) 2   Flexion AROM;Strengthening  3x10 reps 1#     Shoulder Exercises: Standing   Flexion Strengthening;Right;Weights  scaption 3x10 2#     Shoulder Exercises:  Pulleys   Flexion Other (comment)    Other Pulley Exercises UE ranger flex/CW and CCW circles x30 reps each     Shoulder Exercises: ROM/Strengthening   UBE (Upper Arm Bike) UBE 90 RPM x8  min with LUE leading RUE forward only     Manual Therapy   Manual Therapy Passive ROM   Passive ROM PROM for right shoulder flexion/IR/ER and ant cap stretch with end-range holds. Contract/relax used for flexion ROM                  PT Short Term Goals - 11/05/15 1353      PT SHORT TERM GOAL #1   Title I with HEP   Time 4   Period Weeks   Status Achieved     PT SHORT TERM GOAL #2   Title PROM of R shoulder to Protocol limits   Time 4   Period Weeks   Status On-going           PT Long Term Goals - 12/17/15 1309      PT LONG TERM GOAL #1   Title R shoulder ROM WFL to perform ADLs   Time 8  Period Weeks   Status On-going     PT LONG TERM GOAL #2   Title R shoulder strength 5/5 to allow RTW   Time 8   Period Weeks   Status On-going     PT LONG TERM GOAL #3   Title Able to peform ADLS with pain 2/10  or less in R shoulder   Time 8   Period Weeks   Status On-going               Plan - 02/12/16 1803    Clinical Impression Statement Pt was able to perform Therex for RT shldr with minimal pain increase and mainly fatigue. His ROM was 160 degrees for flexion, 80 ER, and 75 degrees IR. He was also able to reach L5  HBB back .   Rehab Potential Excellent   Clinical Impairments Affecting Rehab Potential R biceps tendon repair 2015 current surgery 10/14/15    PT Duration 8 weeks   PT Treatment/Interventions ADLs/Self Care Home Management;Electrical Stimulation;Cryotherapy;Ultrasound;Neuromuscular re-education;Therapeutic exercise;Manual techniques;Scar mobilization;Passive range of motion;Vasopneumatic Device;Patient/family education   PT Next Visit Plan Progress according to protocol in media with modalities per MPT POC (MD. Thomasena Edisollins 01/26/16)    New MD note to  continue PT   PT Home Exercise Plan supine ER/IR with cane, pendulums, elbow flex/ext; AAROM flexion, table slides, AAROM ER.   No new order from MD. We will send Recert to cont.   MD note next visit   Consulted and Agree with Plan of Care Patient      Patient will benefit from skilled therapeutic intervention in order to improve the following deficits and impairments:  Decreased range of motion, Pain, Impaired UE functional use, Decreased strength  Visit Diagnosis: Stiffness of right shoulder, not elsewhere classified  Acute pain of right shoulder  Muscle weakness (generalized)     Problem List There are no active problems to display for this patient.   Scott Chapman,Scott Chapman 02/12/2016, 6:13 PM  [facility] [address] [place], [place], [address] Phone: [phone]   Fax:  [phone]  Name: Scott CockayneJames M Chapman MRN: [id number] Date of Birth: 1960/09/23

## 2016-02-17 ENCOUNTER — Ambulatory Visit: Payer: Worker's Compensation | Admitting: *Deleted

## 2016-02-17 DIAGNOSIS — M25611 Stiffness of right shoulder, not elsewhere classified: Secondary | ICD-10-CM | POA: Diagnosis not present

## 2016-02-17 DIAGNOSIS — M6281 Muscle weakness (generalized): Secondary | ICD-10-CM

## 2016-02-17 DIAGNOSIS — M25511 Pain in right shoulder: Secondary | ICD-10-CM

## 2016-02-17 NOTE — Therapy (Signed)
[facility] [address] [place], [place], [address] Phone: [phone]   Fax:  [phone]  Physical Therapy Treatment  Patient Details  Name: Scott CockayneJames M Bordonaro MRN: [id number] Date of Birth: [birth year] Referring Provider: Marciano SequinBryson Stillwell PA-C  Encounter Date: 02/17/2016      PT End of Session - 02/17/16 1652    Visit Number 33   Number of Visits 42   Date for PT Re-Evaluation 03/05/16   PT Start Time 1636   PT Stop Time 1740   PT Time Calculation (min) 64 min      Past Medical History:  Diagnosis Date  . Allergy   . Hyperlipidemia   . Hypertension     Past Surgical History:  Procedure Laterality Date  . Colapsed lung Right   . HERNIA REPAIR    . left arm surgery    . TENDON REPAIR Right    arm    There were no vitals filed for this visit.      Subjective Assessment - 02/17/16 1649    Subjective RT shldr feels tight and sore today. 3/10 today.     Pertinent History HTN, asthma   Currently in Pain? Yes   Pain Score 3    Pain Location Arm   Pain Orientation Right   Pain Descriptors / Indicators Aching   Pain Type Surgical pain   Pain Onset More than a month ago   Pain Frequency Intermittent                         [facility] Adult PT Treatment/Exercise - 02/17/16 0001      Elbow Exercises   Elbow Flexion Strengthening;Right;20 reps;10 reps  3# 3x10     Shoulder Exercises: Supine   Protraction Strengthening;Right  5x10   Protraction Weight (lbs) 2#  shldr level   Other Supine Exercises circles at 100 degrees flexion 2# 3x10 each way     Shoulder Exercises: Prone   Other Prone Exercises kneeling for rows, and ext 3# 3x10 each, Horizontal abd 1# 3x10     Shoulder Exercises: Sidelying   External Rotation Strengthening;Right;20 reps;Weights   External Rotation Weight (lbs) 1   Flexion --  3x10 reps 1#     Shoulder Exercises: Standing   Flexion Strengthening;Right;Weights  scaption 4x10 1#      Shoulder Exercises: Pulleys   Flexion Other (comment)  5min   Other Pulley Exercises UE ranger flex/CW and CCW circles x30 reps each     Shoulder Exercises: ROM/Strengthening   UBE (Upper Arm Bike) UBE 90 RPM x8  min with LUE leading RUE forward only     Modalities   Modalities Electrical Stimulation     Moist Heat Therapy   Number Minutes Moist Heat 15 Minutes   Moist Heat Location Shoulder     Electrical Stimulation   Electrical Stimulation Location R Deltoids   IFC x 15 mins 80-150hz    Electrical Stimulation Goals Pain     Manual Therapy   Manual Therapy Passive ROM   Passive ROM PROM for right shoulder flexion/IR/ER and ant cap stretch with end-range holds. Contract/relax used for flexion ROM                  PT Short Term Goals - 11/05/15 1353      PT SHORT TERM GOAL #1   Title I with HEP   Time 4   Period Weeks   Status Achieved  PT SHORT TERM GOAL #2   Title PROM of R shoulder to Protocol limits   Time 4   Period Weeks   Status On-going           PT Long Term Goals - 12/17/15 1309      PT LONG TERM GOAL #1   Title R shoulder ROM WFL to perform ADLs   Time 8   Period Weeks   Status On-going     PT LONG TERM GOAL #2   Title R shoulder strength 5/5 to allow RTW   Time 8   Period Weeks   Status On-going     PT LONG TERM GOAL #3   Title Able to peform ADLS with pain 2/10  or less in R shoulder   Time 8   Period Weeks   Status On-going               Plan - 02/17/16 1720    Clinical Impression Statement Pt arrived to clinic today with increased shldr tightness and soreness. He was sore along RT ACJ and had discomfort there during flexion exs. His Flexion ROM was decreased by 5 degrees during PROM due to soreness, ER to 80 degrees, and IR to 75 degrees. We discussed decreasing hand behind back stretch for a few days to let ACJ soreness decrease.   Rehab Potential Excellent   Clinical Impairments Affecting Rehab Potential R  biceps tendon repair 2015 current surgery 10/14/15    PT Frequency 3x / week   PT Duration 8 weeks   PT Treatment/Interventions ADLs/Self Care Home Management;Electrical Stimulation;Cryotherapy;Ultrasound;Neuromuscular re-education;Therapeutic exercise;Manual techniques;Scar mobilization;Passive range of motion;Vasopneumatic Device;Patient/family education   PT Next Visit Plan Progress according to protocol in media with modalities per MPT POC (MD. Thomasena Edis 01/26/16)    New MD note to continue PT   Consulted and Agree with Plan of Care Patient      Patient will benefit from skilled therapeutic intervention in order to improve the following deficits and impairments:  Decreased range of motion, Pain, Impaired UE functional use, Decreased strength  Visit Diagnosis: Stiffness of right shoulder, not elsewhere classified  Muscle weakness (generalized)  Acute pain of right shoulder     Problem List There are no active problems to display for this patient.   Rudine Rieger,CHRIS 02/17/2016, 5:57 PM  [facility] [address] [place], [place], [address] Phone: [phone]   Fax:  [phone]  Name: AMALIO LOE MRN: [id number] Date of Birth: [birth year]

## 2016-02-19 ENCOUNTER — Ambulatory Visit: Payer: Worker's Compensation | Admitting: *Deleted

## 2016-02-19 DIAGNOSIS — M6281 Muscle weakness (generalized): Secondary | ICD-10-CM

## 2016-02-19 DIAGNOSIS — M25611 Stiffness of right shoulder, not elsewhere classified: Secondary | ICD-10-CM | POA: Diagnosis not present

## 2016-02-19 DIAGNOSIS — M25511 Pain in right shoulder: Secondary | ICD-10-CM

## 2016-02-19 NOTE — Therapy (Signed)
[facility] [place], [place], [address] Phone: [phone]   Fax:  [phone]  Physical Therapy Treatment  Patient Details  Name: Scott Chapman MRN: [id number] Date of Birth: [birth year] Referring Provider: Jerilynn Mages PA-C  Encounter Date: 02/19/2016      PT End of Session - 02/19/16 1707    Visit Number 34   Number of Visits 42   Date for PT Re-Evaluation 03/05/16   Authorization Type To MD 02-25-16   PT Start Time 1645   PT Stop Time 1745   PT Time Calculation (min) 60 min      Past Medical History:  Diagnosis Date  . Allergy   . Hyperlipidemia   . Hypertension     Past Surgical History:  Procedure Laterality Date  . Colapsed lung Right   . HERNIA REPAIR    . left arm surgery    . TENDON REPAIR Right    arm    There were no vitals filed for this visit.      Subjective Assessment - 02/19/16 1706    Subjective RT shldr feels better today. 1-2/10   Pertinent History HTN, asthma   Currently in Pain? Yes   Pain Score 2    Pain Location Arm   Pain Orientation Right   Pain Descriptors / Indicators Sore   Pain Type Surgical pain   Pain Onset More than a month ago   Pain Frequency Intermittent                         [facility] Adult PT Treatment/Exercise - 02/19/16 0001      Elbow Exercises   Elbow Flexion Strengthening;Right;20 reps;10 reps     Shoulder Exercises: Supine   Protraction Strengthening;Right   Protraction Weight (lbs) 2#  shldr level   Other Supine Exercises circles at 100 degrees flexion 2# 3x10 each way     Shoulder Exercises: Prone   Other Prone Exercises kneeling for rows, and ext 3# 3x20 each, Horizontal abd 2# 4x10     Shoulder Exercises: Sidelying   External Rotation Strengthening;Right;Weights  5x10   External Rotation Weight (lbs) 1     Shoulder Exercises: Standing   Flexion Strengthening;Right;Weights  scaption 4x10 1#     Shoulder Exercises:  Pulleys   Flexion Other (comment)  42mn   Other Pulley Exercises UE ranger flex/CW and CCW circles x30 reps each     Shoulder Exercises: ROM/Strengthening   UBE (Upper Arm Bike) UBE 90 RPM x8  min with LUE leading RUE forward only     Modalities   Modalities Electrical Stimulation     Moist Heat Therapy   Number Minutes Moist Heat 15 Minutes   Moist Heat Location Shoulder     Electrical Stimulation   Electrical Stimulation Location R Deltoids   IFC x 15 mins 80-150hz   Electrical Stimulation Goals Pain     Manual Therapy   Manual Therapy Passive ROM   Passive ROM PROM for right shoulder flexion/IR/ER and ant cap stretch with end-range holds. Contract/relax used for flexion ROM                  PT Short Term Goals - 11/05/15 1353      PT SHORT TERM GOAL #1   Title I with HEP   Time 4   Period Weeks   Status Achieved     PT SHORT TERM GOAL #2   Title PROM of  R shoulder to Protocol limits   Time 4   Period Weeks   Status On-going           PT Long Term Goals - 02/19/16 1742      PT LONG TERM GOAL #1   Title R shoulder ROM WFL to perform ADLs   Time 8   Period Weeks   Status Achieved               Plan - 02/19/16 1738    Clinical Impression Statement Pt arrived to clinic with less RT shldr pain today 2/10. He was still tender along ACJ,but not limited by pain. He was able to complete PREs with increased reps with soreness going up to3-4/10 and mainly fatigue. Rx was tolerated much better. His Active Flexion ROM was 160 degrees in standing today as well as PROM in supine. LTG #1 was met today. Others are ongoing due to pain and strength deficits.   Rehab Potential Excellent   Clinical Impairments Affecting Rehab Potential R biceps tendon repair 2015 current surgery 10/14/15    PT Frequency 3x / week   PT Duration 8 weeks   PT Treatment/Interventions ADLs/Self Care Home Management;Electrical Stimulation;Cryotherapy;Ultrasound;Neuromuscular  re-education;Therapeutic exercise;Manual techniques;Scar mobilization;Passive range of motion;Vasopneumatic Device;Patient/family education   PT Next Visit Plan Progress according to protocol in media with modalities per MPT POC                                      (MD. Theda Sers 02/25/16)      PT Home Exercise Plan supine ER/IR with cane, pendulums, elbow flex/ext; AAROM flexion, table slides, AAROM ER.   No new order from MD. We will send Recert to cont.   MD note next visit   Consulted and Agree with Plan of Care Patient      Patient will benefit from skilled therapeutic intervention in order to improve the following deficits and impairments:  Decreased range of motion, Pain, Impaired UE functional use, Decreased strength  Visit Diagnosis: Stiffness of right shoulder, not elsewhere classified  Acute pain of right shoulder  Muscle weakness (generalized)     Problem List There are no active problems to display for this patient.   Sabrina Keough,CHRIS, PTA 02/19/2016, 5:57 PM  [facility] [address] [place], [place], [address] Phone: [phone]   Fax:  [phone]  Name: TEANDRE HAMRE MRN: [id number] Date of Birth: [birth year]

## 2016-02-24 ENCOUNTER — Ambulatory Visit: Payer: Worker's Compensation | Admitting: Physical Therapy

## 2016-02-24 ENCOUNTER — Encounter: Payer: Self-pay | Admitting: Physical Therapy

## 2016-02-24 DIAGNOSIS — M25611 Stiffness of right shoulder, not elsewhere classified: Secondary | ICD-10-CM | POA: Diagnosis not present

## 2016-02-24 DIAGNOSIS — M6281 Muscle weakness (generalized): Secondary | ICD-10-CM

## 2016-02-24 DIAGNOSIS — M25511 Pain in right shoulder: Secondary | ICD-10-CM

## 2016-02-24 NOTE — Therapy (Signed)
[facility] [place], [place], [address] Phone: [phone]   Fax:  [phone]  Physical Therapy Treatment  Patient Details  Name: Scott Chapman MRN: [id number] Date of Birth: [birth year] Referring Provider: Jerilynn Mages PA-C  Encounter Date: 02/24/2016      PT End of Session - 02/24/16 1640    Visit Number 35   Number of Visits 42   Date for PT Re-Evaluation 03/05/16   Authorization Type To MD 02-25-16   PT Start Time 1640   PT Stop Time 1727   PT Time Calculation (min) 47 min   Activity Tolerance Patient tolerated treatment well   Behavior During Therapy [facility] for tasks assessed/performed      Past Medical History:  Diagnosis Date  . Allergy   . Hyperlipidemia   . Hypertension     Past Surgical History:  Procedure Laterality Date  . Colapsed lung Right   . HERNIA REPAIR    . left arm surgery    . TENDON REPAIR Right    arm    There were no vitals filed for this visit.      Subjective Assessment - 02/24/16 1640    Subjective Reports "a little bit" of R shoulder soreness.   Pertinent History HTN, asthma   Currently in Pain? Yes   Pain Score 2    Pain Location Shoulder   Pain Orientation Right   Pain Descriptors / Indicators Sore   Pain Type Surgical pain   Pain Onset More than a month ago            [facility] PT Assessment - 02/24/16 0001      Assessment   Medical Diagnosis s/p R RCR   Onset Date/Surgical Date 10/14/15   Hand Dominance Right   Next MD Visit 02/25/2016     Precautions   Precautions Shoulder   Type of Shoulder Precautions RCR see protocol   Precaution Comments no lifting, excessive ext, stretching or sudden movements     ROM / Strength   AROM / PROM / Strength AROM;Strength     AROM   Overall AROM  Within functional limits for tasks performed   AROM Assessment Site Shoulder   Right/Left Shoulder Right   Right Shoulder Flexion 145 Degrees   Right Shoulder Internal  Rotation 60 Degrees   Right Shoulder External Rotation 80 Degrees     Strength   Strength Assessment Site Shoulder   Right/Left Shoulder Right   Right Shoulder Flexion 5/5   Right Shoulder Internal Rotation 5/5   Right Shoulder External Rotation 4+/5                     [facility] Adult PT Treatment/Exercise - 02/24/16 0001      Shoulder Exercises: Supine   Protraction Strengthening;Right;Weights   Protraction Weight (lbs) 2   Protraction Limitations 3x10 reps     Shoulder Exercises: Prone   Retraction Strengthening;Right;Weights   Retraction Weight (lbs) 3   Retraction Limitations 3x10 reps   Extension Strengthening;Right;Weights  3x10 reps   Extension Weight (lbs) 3   Horizontal ABduction 1 Strengthening;Right;Weights   Horizontal ABduction 1 Weight (lbs) 1   Horizontal ABduction 1 Limitations 3x10 reps     Shoulder Exercises: Sidelying   External Rotation Strengthening;Right;Weights   External Rotation Weight (lbs) 1   External Rotation Limitations 3x10 reps     Shoulder Exercises: Standing   Protraction Strengthening;Right;20 reps;Theraband   Theraband Level (Shoulder Protraction) Level 3 (Green)  External Rotation Strengthening;Right;20 reps;Theraband   Theraband Level (Shoulder External Rotation) Level 3 (Green)   Internal Rotation Strengthening;Right;20 reps;Theraband   Theraband Level (Shoulder Internal Rotation) Level 3 (Green)   Flexion Strengthening;Weights;Right   Shoulder Flexion Weight (lbs) 1   Flexion Limitations B shoulder scaption 1# 3x10 reps   Extension Strengthening;Right;Theraband   Theraband Level (Shoulder Extension) Level 3 (Green)   Extension Limitations 3x10 reps   Row Strengthening;Right;Theraband   Theraband Level (Shoulder Row) Level 3 (Green)   Row Limitations 3x10 reps     Shoulder Exercises: Pulleys   Flexion Other (comment)  x5 min   Other Pulley Exercises UE ranger flex/CW and CCW circles x30 reps each     Shoulder  Exercises: ROM/Strengthening   UBE (Upper Arm Bike) UBE 90 RPM x8  min with LUE leading RUE forward only                  PT Short Term Goals - 02/24/16 1733      PT SHORT TERM GOAL #1   Title I with HEP   Time 4   Period Weeks   Status Achieved     PT SHORT TERM GOAL #2   Title PROM of R shoulder to Protocol limits   Time 4   Period Weeks   Status On-going           PT Long Term Goals - 02/24/16 1726      PT LONG TERM GOAL #1   Title R shoulder ROM WFL to perform ADLs   Time 8   Period Weeks   Status Achieved     PT LONG TERM GOAL #2   Title R shoulder strength 5/5 to allow RTW   Time 8   Period Weeks   Status Partially Met  R shoulder flex, IR MMT 5/5, ER 4+/5 02/24/2016     PT LONG TERM GOAL #3   Title Able to peform ADLS with pain 1/61  or less in R shoulder   Time 8   Period Weeks   Status Achieved               Plan - 02/24/16 1730    Clinical Impression Statement Patient has progressed well through R shoulder strengthening and able to complete green theraband with only difficulty being with ER. Patient able to tolerate increased repititions with most exercises today with reports of fatigue with exercises by end of sets. Patient's R shoulder ROM measured as 140 deg flex, 60 deg IR, 80 deg ER in antigravity positions. R shoulder MMT flexion and IR 5/5 but ER 4+/5. Patient denied any modalities following today's treatment but remains concerned with R Bicep tolerance.   Rehab Potential Excellent   Clinical Impairments Affecting Rehab Potential R biceps tendon repair 2015 current surgery 10/14/15    PT Frequency 3x / week   PT Duration 8 weeks   PT Treatment/Interventions ADLs/Self Care Home Management;Electrical Stimulation;Cryotherapy;Ultrasound;Neuromuscular re-education;Therapeutic exercise;Manual techniques;Scar mobilization;Passive range of motion;Vasopneumatic Device;Patient/family education   PT Next Visit Plan Continue with R shoulder  strengthening per MD discretion.   PT Home Exercise Plan supine ER/IR with cane, pendulums, elbow flex/ext; AAROM flexion, table slides, AAROM ER.   No new order from MD. We will send Recert to cont.   MD note next visit   Consulted and Agree with Plan of Care Patient      Patient will benefit from skilled therapeutic intervention in order to improve the following deficits and impairments:  Decreased range  of motion, Pain, Impaired UE functional use, Decreased strength  Visit Diagnosis: Stiffness of right shoulder, not elsewhere classified  Acute pain of right shoulder  Muscle weakness (generalized)     Problem List There are no active problems to display for this patient.   Ahmed Prima, PTA 02/24/16 6:21 PM [place] MPT [facility] [address] [place], [place], [address] Phone: [phone]   Fax:  [phone]  Name: Scott Chapman MRN: [id number] Date of Birth: [birth year]

## 2016-02-26 ENCOUNTER — Ambulatory Visit: Payer: Worker's Compensation | Admitting: *Deleted

## 2016-02-26 DIAGNOSIS — M25611 Stiffness of right shoulder, not elsewhere classified: Secondary | ICD-10-CM | POA: Diagnosis not present

## 2016-02-26 DIAGNOSIS — M25511 Pain in right shoulder: Secondary | ICD-10-CM

## 2016-02-26 DIAGNOSIS — M6281 Muscle weakness (generalized): Secondary | ICD-10-CM

## 2016-02-26 NOTE — Therapy (Signed)
[facility] [place], [place], [address] Phone: [phone]   Fax:  [phone]  Physical Therapy Treatment  Patient Details  Name: Scott Chapman MRN: [id number] Date of Birth: 06-16-1960 Referring Provider: Jerilynn Mages PA-C  Encounter Date: 02/26/2016      PT End of Session - 02/26/16 1754    Visit Number 36   Number of Visits 46   Date for PT Re-Evaluation 03/13/16   PT Start Time 4496   PT Stop Time 7591   PT Time Calculation (min) 60 min      Past Medical History:  Diagnosis Date  . Allergy   . Hyperlipidemia   . Hypertension     Past Surgical History:  Procedure Laterality Date  . Colapsed lung Right   . HERNIA REPAIR    . left arm surgery    . TENDON REPAIR Right    arm    There were no vitals filed for this visit.                       [facility] Adult PT Treatment/Exercise - 02/26/16 0001      Elbow Exercises   Elbow Flexion Strengthening;Right;20 reps;10 reps     Shoulder Exercises: Supine   Protraction Strengthening;Right;Weights   Protraction Weight (lbs) 2   Protraction Limitations 5x10 reps   Other Supine Exercises circles at 100 degrees flexion 2# ball  3x10 each way     Shoulder Exercises: Prone   Retraction Strengthening;Right;Weights   Retraction Weight (lbs) 3   Retraction Limitations 5x10 reps   Extension Strengthening;Right;Weights  5x10 reps   Extension Weight (lbs) 3   Horizontal ABduction 1 Strengthening;Right;Weights   Horizontal ABduction 1 Weight (lbs) 2   Horizontal ABduction 1 Limitations 5x10 reps     Shoulder Exercises: Standing   Flexion Strengthening;Weights;Right  5x10   Shoulder Flexion Weight (lbs) 1     Shoulder Exercises: Pulleys   Other Pulley Exercises UE ranger flex/CW and CCW circles x30 reps each     Shoulder Exercises: ROM/Strengthening   UBE (Upper Arm Bike) UBE 90 RPM x8  min with LUE leading RUE forward only     Modalities    Modalities Electrical Stimulation     Moist Heat Therapy   Number Minutes Moist Heat 15 Minutes   Moist Heat Location Shoulder     Electrical Stimulation   Electrical Stimulation Location R Deltoids   IFC x 15 mins 80-'150hz'    Electrical Stimulation Goals Pain     Manual Therapy   Manual Therapy Passive ROM   Passive ROM PROM for right shoulder flexion/IR/ER and ant cap stretch with end-range holds. Contract/relax used for flexion ROM                  PT Short Term Goals - 02/24/16 1733      PT SHORT TERM GOAL #1   Title I with HEP   Time 4   Period Weeks   Status Achieved     PT SHORT TERM GOAL #2   Title PROM of R shoulder to Protocol limits   Time 4   Period Weeks   Status On-going           PT Long Term Goals - 02/24/16 1726      PT LONG TERM GOAL #1   Title R shoulder ROM WFL to perform ADLs   Time 8   Period Weeks   Status Achieved  PT LONG TERM GOAL #2   Title R shoulder strength 5/5 to allow RTW   Time 8   Period Weeks   Status Partially Met  R shoulder flex, IR MMT 5/5, ER 4+/5 02/24/2016     PT LONG TERM GOAL #3   Title Able to peform ADLS with pain 0/22  or less in R shoulder   Time 8   Period Weeks   Status Achieved               Plan - 02/26/16 1803    Clinical Impression Statement Pt had F/U with MD and has N.O to cont with PT. He staes that MD was pleased with current status. Pt did fairly well with Rx today and most PREs were increased to 5 sets of 10 and Pt was mainly fatigued.   Rehab Potential Excellent   Clinical Impairments Affecting Rehab Potential R biceps tendon repair 2015 current surgery 10/14/15    PT Frequency 3x / week   PT Duration 8 weeks   PT Treatment/Interventions ADLs/Self Care Home Management;Electrical Stimulation;Cryotherapy;Ultrasound;Neuromuscular re-education;Therapeutic exercise;Manual techniques;Scar mobilization;Passive range of motion;Vasopneumatic Device;Patient/family education   PT  Next Visit Plan Continue with R shoulder strengthening per MD discretion.   PT Home Exercise Plan supine ER/IR with cane, pendulums, elbow flex/ext; AAROM flexion, table slides, AAROM ER.   No new order from MD. We will send Recert to cont.   MD note next visit   Consulted and Agree with Plan of Care Patient      Patient will benefit from skilled therapeutic intervention in order to improve the following deficits and impairments:  Decreased range of motion, Pain, Impaired UE functional use, Decreased strength  Visit Diagnosis: Stiffness of right shoulder, not elsewhere classified  Acute pain of right shoulder  Muscle weakness (generalized)     Problem List There are no active problems to display for this patient.   RAMSEUR,CHRIS, PTA 02/26/2016, 6:07 PM  [facility] [place], [place], [address] Phone: [phone]   Fax:  [phone]  Name: BRAIN HONEYCUTT MRN: [id number] Date of Birth: 02/18/1960

## 2016-03-02 ENCOUNTER — Ambulatory Visit: Payer: Worker's Compensation | Admitting: *Deleted

## 2016-03-02 DIAGNOSIS — M25511 Pain in right shoulder: Secondary | ICD-10-CM

## 2016-03-02 DIAGNOSIS — M6281 Muscle weakness (generalized): Secondary | ICD-10-CM

## 2016-03-02 DIAGNOSIS — M25611 Stiffness of right shoulder, not elsewhere classified: Secondary | ICD-10-CM | POA: Diagnosis not present

## 2016-03-02 NOTE — Therapy (Signed)
[facility] [place], [place], [address] Phone: [phone]   Fax:  [phone]  Physical Therapy Treatment  Patient Details  Name: Scott Chapman MRN: [id number] Date of Birth: 04-28-1960 Referring Provider: Jerilynn Mages PA-C  Encounter Date: 03/02/2016      PT End of Session - 03/02/16 1704    Visit Number 37   Number of Visits 46   Date for PT Re-Evaluation 03/13/16   Authorization Type To MD 04-07-16   PT Start Time 1645   PT Stop Time 5361   PT Time Calculation (min) 49 min      Past Medical History:  Diagnosis Date  . Allergy   . Hyperlipidemia   . Hypertension     Past Surgical History:  Procedure Laterality Date  . Colapsed lung Right   . HERNIA REPAIR    . left arm surgery    . TENDON REPAIR Right    arm    There were no vitals filed for this visit.      Subjective Assessment - 03/02/16 1702    Subjective Reports "a little bit" of R shoulder soreness., Doing better   Pertinent History HTN, asthma   Currently in Pain? Yes   Pain Score 2    Pain Location Shoulder   Pain Orientation Right   Pain Descriptors / Indicators Sore   Pain Type Surgical pain   Pain Onset More than a month ago   Pain Frequency Intermittent                         [facility] Adult PT Treatment/Exercise - 03/02/16 0001      Exercises   Exercises Shoulder;Elbow     Elbow Exercises   Elbow Flexion Strengthening;Right  3# 6x 10   Elbow Extension Theraband;Strengthening;Both  XTS pink 3x 10-15     Shoulder Exercises: Supine   Protraction Strengthening;Right;Weights   Protraction Weight (lbs) 3   Protraction Limitations 6 x10 reps   Other Supine Exercises circles at 100 degrees flexion 2# ball  3x10 each way     Shoulder Exercises: Prone   Retraction Strengthening;Right;Weights  3# 6x10   Retraction Weight (lbs) 3   Retraction Limitations 6 x10 reps   Extension Strengthening;Right;Weights  6  x10 reps   Extension Weight (lbs) 3     Shoulder Exercises: Sidelying   External Rotation Strengthening;Right;Weights   External Rotation Weight (lbs) 2   External Rotation Limitations 3x10 reps     Shoulder Exercises: Standing   Flexion Strengthening;Weights;Right  6 x10   Shoulder Flexion Weight (lbs) 1     Shoulder Exercises: Therapy Ball   Other Therapy Ball Exercises Ball/ wall elevation and circles each way 3x10     Shoulder Exercises: ROM/Strengthening   UBE (Upper Arm Bike) UBE 90 RPM x8  min with LUE leading RUE forward only                  PT Short Term Goals - 02/24/16 1733      PT SHORT TERM GOAL #1   Title I with HEP   Time 4   Period Weeks   Status Achieved     PT SHORT TERM GOAL #2   Title PROM of R shoulder to Protocol limits   Time 4   Period Weeks   Status On-going           PT Long Term Goals - 02/24/16 1726  PT LONG TERM GOAL #1   Title R shoulder ROM WFL to perform ADLs   Time 8   Period Weeks   Status Achieved     PT LONG TERM GOAL #2   Title R shoulder strength 5/5 to allow RTW   Time 8   Period Weeks   Status Partially Met  R shoulder flex, IR MMT 5/5, ER 4+/5 02/24/2016     PT LONG TERM GOAL #3   Title Able to peform ADLS with pain 2/63  or less in R shoulder   Time 8   Period Weeks   Status Achieved               Plan - 03/02/16 1707    Clinical Impression Statement Pt did fairly well with Rx today and was able to progress Reps again to 6x10 on most PREs. with reports of mainly fatigue and not increased pain. RT ACJ pain has come down now and Pt has been able to progress these last few weeks.   Rehab Potential Excellent   Clinical Impairments Affecting Rehab Potential R biceps tendon repair 2015 current surgery 10/14/15    PT Frequency 3x / week   PT Duration 8 weeks   PT Treatment/Interventions ADLs/Self Care Home Management;Electrical Stimulation;Cryotherapy;Ultrasound;Neuromuscular  re-education;Therapeutic exercise;Manual techniques;Scar mobilization;Passive range of motion;Vasopneumatic Device;Patient/family education   PT Next Visit Plan Continue with R shoulder strengthening per MD discretion.   PT Home Exercise Plan supine ER/IR with cane, pendulums, elbow flex/ext; AAROM flexion, table slides, AAROM ER.   No new order from MD. We will send Recert to cont.      Consulted and Agree with Plan of Care Patient      Patient will benefit from skilled therapeutic intervention in order to improve the following deficits and impairments:  Decreased range of motion, Pain, Impaired UE functional use, Decreased strength  Visit Diagnosis: Stiffness of right shoulder, not elsewhere classified  Acute pain of right shoulder  Muscle weakness (generalized)     Problem List There are no active problems to display for this patient.   Eamon Tantillo,CHRIS, PTA 03/02/2016, 5:46 PM  [facility] [place], [place], [address] Phone: [phone]   Fax:  [phone]  Name: LYNDA CAPISTRAN MRN: [id number] Date of Birth: [birth year]

## 2016-03-04 ENCOUNTER — Encounter: Payer: Self-pay | Admitting: *Deleted

## 2016-03-09 ENCOUNTER — Ambulatory Visit: Payer: Worker's Compensation | Admitting: *Deleted

## 2016-03-09 DIAGNOSIS — M25611 Stiffness of right shoulder, not elsewhere classified: Secondary | ICD-10-CM

## 2016-03-09 DIAGNOSIS — M6281 Muscle weakness (generalized): Secondary | ICD-10-CM

## 2016-03-09 DIAGNOSIS — M25511 Pain in right shoulder: Secondary | ICD-10-CM

## 2016-03-09 NOTE — Therapy (Signed)
[facility] [place], [place], [address] Phone: [phone]   Fax:  [phone]  Physical Therapy Treatment  Patient Details  Name: Scott Chapman MRN: [id number] Date of Birth: [birth year] Referring Provider: Jerilynn Mages PA-C  Encounter Date: 03/09/2016      PT End of Session - 03/09/16 1732    Visit Number 38   Number of Visits 46   Date for PT Re-Evaluation 03/13/16   Authorization Type To MD 04-07-16   PT Start Time 5170   PT Stop Time 1837   PT Time Calculation (min) 52 min      Past Medical History:  Diagnosis Date  . Allergy   . Hyperlipidemia   . Hypertension     Past Surgical History:  Procedure Laterality Date  . Colapsed lung Right   . HERNIA REPAIR    . left arm surgery    . TENDON REPAIR Right    arm    There were no vitals filed for this visit.      Subjective Assessment - 03/09/16 1731    Subjective Reports "a little bit" of R shoulder soreness., Doing better   Pertinent History HTN, asthma   Currently in Pain? Yes   Pain Score 2    Pain Location Shoulder   Pain Orientation Right   Pain Descriptors / Indicators Sore   Pain Type Surgical pain   Pain Onset More than a month ago   Pain Frequency Intermittent                         [facility] Adult PT Treatment/Exercise - 03/09/16 0001      Exercises   Exercises Shoulder;Elbow     Elbow Exercises   Elbow Flexion Strengthening;Right  4# 6x 10   Elbow Extension Theraband;Strengthening;Both  XTS pink 3x 20     Shoulder Exercises: Supine   Protraction Strengthening;Right;Weights   Protraction Weight (lbs) 4   Protraction Limitations 6 x10 reps     Shoulder Exercises: Prone   Retraction Strengthening;Right;Weights  3# 6x10   Retraction Weight (lbs) 4   Retraction Limitations 6 x10 reps   Extension Strengthening;Right;Weights  6 x10 reps   Extension Weight (lbs) 4     Shoulder Exercises: Sidelying   External  Rotation Strengthening;Right;Weights   External Rotation Weight (lbs) 2   External Rotation Limitations 6x10 reps     Shoulder Exercises: Standing   Flexion Strengthening;Weights;Right  6 x10   Shoulder Flexion Weight (lbs) 2     Shoulder Exercises: Pulleys   Flexion Other (comment)  x5 min   Other Pulley Exercises UE ranger flex/CW and CCW circles x30 reps each     Shoulder Exercises: Therapy Ball   Other Therapy Ball Exercises Wall push-ups 3x20     Shoulder Exercises: ROM/Strengthening   UBE (Upper Arm Bike) UBE 90 RPM x 10 min                   PT Short Term Goals - 02/24/16 1733      PT SHORT TERM GOAL #1   Title I with HEP   Time 4   Period Weeks   Status Achieved     PT SHORT TERM GOAL #2   Title PROM of R shoulder to Protocol limits   Time 4   Period Weeks   Status On-going           PT Long Term Goals - 02/24/16 1726  PT LONG TERM GOAL #1   Title R shoulder ROM WFL to perform ADLs   Time 8   Period Weeks   Status Achieved     PT LONG TERM GOAL #2   Title R shoulder strength 5/5 to allow RTW   Time 8   Period Weeks   Status Partially Met  R shoulder flex, IR MMT 5/5, ER 4+/5 02/24/2016     PT LONG TERM GOAL #3   Title Able to peform ADLS with pain 7/09  or less in R shoulder   Time 8   Period Weeks   Status Achieved               Plan - 03/09/16 1748    Clinical Impression Statement Pt arrived to clinic doing fairly well today with RT shldr just sore. He did fairly well again today with progression of PREs. We were able to keep th the Reps up to 60 total while going up in [place] on most Exercises. Mainly soreness and fatigue after Rx. Did great.   Rehab Potential Excellent   Clinical Impairments Affecting Rehab Potential R biceps tendon repair 2015 current surgery 10/14/15    PT Frequency 3x / week   PT Duration 8 weeks   PT Treatment/Interventions ADLs/Self Care Home Management;Electrical  Stimulation;Cryotherapy;Ultrasound;Neuromuscular re-education;Therapeutic exercise;Manual techniques;Scar mobilization;Passive range of motion;Vasopneumatic Device;Patient/family education   PT Next Visit Plan Continue with R shoulder strengthening per MD discretion.   PT Home Exercise Plan supine ER/IR with cane, pendulums, elbow flex/ext; AAROM flexion, table slides, AAROM ER.   No new order from MD. We will send Recert to cont.      Consulted and Agree with Plan of Care Patient      Patient will benefit from skilled therapeutic intervention in order to improve the following deficits and impairments:  Decreased range of motion, Pain, Impaired UE functional use, Decreased strength  Visit Diagnosis: Stiffness of right shoulder, not elsewhere classified  Acute pain of right shoulder  Muscle weakness (generalized)     Problem List There are no active problems to display for this patient.   RAMSEUR,CHRIS, PTA 03/09/2016, 6:00 PM  [facility] [place], [place], [address] Phone: [phone]   Fax:  [phone]  Name: Scott Chapman MRN: [id number] Date of Birth: 08-21-1960

## 2016-03-11 ENCOUNTER — Ambulatory Visit: Payer: Worker's Compensation | Attending: Orthopedic Surgery | Admitting: *Deleted

## 2016-03-11 DIAGNOSIS — M25611 Stiffness of right shoulder, not elsewhere classified: Secondary | ICD-10-CM | POA: Diagnosis not present

## 2016-03-11 DIAGNOSIS — M6281 Muscle weakness (generalized): Secondary | ICD-10-CM | POA: Diagnosis present

## 2016-03-11 DIAGNOSIS — M25511 Pain in right shoulder: Secondary | ICD-10-CM

## 2016-03-11 NOTE — Therapy (Signed)
[facility] [place], [place], [address] Phone: [phone]   Fax:  [phone]  Physical Therapy Treatment  Patient Details  Name: Scott Chapman MRN: [id number] Date of Birth: 23-Feb-1960 Referring Provider: Jerilynn Mages PA-C  Encounter Date: 03/11/2016      PT End of Session - 03/11/16 1708    Visit Number 39   Number of Visits 46   Date for PT Re-Evaluation 04/07/16   Authorization Type To MD 04-07-16   PT Start Time 1645   PT Stop Time 1737   PT Time Calculation (min) 52 min      Past Medical History:  Diagnosis Date  . Allergy   . Hyperlipidemia   . Hypertension     Past Surgical History:  Procedure Laterality Date  . Colapsed lung Right   . HERNIA REPAIR    . left arm surgery    . TENDON REPAIR Right    arm    There were no vitals filed for this visit.      Subjective Assessment - 03/11/16 1710    Subjective Reports "a little bit" of R shoulder soreness., Doing better though.   Pertinent History HTN, asthma   Currently in Pain? Yes   Pain Score 2    Pain Location Shoulder   Pain Orientation Right   Pain Descriptors / Indicators Sore   Pain Type Surgical pain            [facility] PT Assessment - 03/11/16 0001      AROM   AROM Assessment Site Shoulder   Right/Left Shoulder Right   Right Shoulder Flexion 160 Degrees   Right Shoulder Internal Rotation 65 Degrees  Hand behind back L4   Right Shoulder External Rotation 65 Degrees  60 in standing                     [facility] Adult PT Treatment/Exercise - 03/11/16 0001      Exercises   Exercises Shoulder;Elbow     Elbow Exercises   Elbow Flexion Strengthening;Right  4# 6x 10   Elbow Extension Theraband;Strengthening;Both  XTS pink 3x 20     Shoulder Exercises: Supine   Protraction Strengthening;Right;Weights   Protraction Weight (lbs) 4   Protraction Limitations 6 x10 reps     Shoulder Exercises: Prone   Retraction  Strengthening;Right;Weights  3# 6x10   Retraction Weight (lbs) 4   Retraction Limitations 6 x10 reps   Extension Strengthening;Right;Weights  6 x10 reps   Extension Weight (lbs) 4     Shoulder Exercises: Sidelying   External Rotation Strengthening;Right;Weights   External Rotation Weight (lbs) 2   External Rotation Limitations 6x10 reps     Shoulder Exercises: Standing   Flexion Strengthening;Weights;Right  6 x10   Shoulder Flexion Weight (lbs) 2     Shoulder Exercises: Pulleys   Flexion Other (comment)  x5 min   Other Pulley Exercises --     Shoulder Exercises: ROM/Strengthening   UBE (Upper Arm Bike) UBE 90 RPM x8 min                   PT Short Term Goals - 03/11/16 1742      PT SHORT TERM GOAL #2   Title PROM of R shoulder to Protocol limits   Time 4   Period Weeks   Status Achieved           PT Long Term Goals - 02/24/16 1726  PT LONG TERM GOAL #1   Title R shoulder ROM WFL to perform ADLs   Time 8   Period Weeks   Status Achieved     PT LONG TERM GOAL #2   Title R shoulder strength 5/5 to allow RTW   Time 8   Period Weeks   Status Partially Met  R shoulder flex, IR MMT 5/5, ER 4+/5 02/24/2016     PT LONG TERM GOAL #3   Title Able to peform ADLS with pain 4/71  or less in R shoulder   Time 8   Period Weeks   Status Achieved               Plan - 03/11/16 1741    Clinical Impression Statement Pt arrived to clinic today a little sore from last Rx, but doing well. He was able to perform Therex and ACt.'s for RT UE again with mainly fatigue end of Rx. His AROM for flexion in standing was 160 degrees, ER 65 degrees, HBB to L4. He did good with Rx again with no set-backs.   Rehab Potential Excellent   Clinical Impairments Affecting Rehab Potential R biceps tendon repair 2015 current surgery 10/14/15    PT Frequency 3x / week   PT Duration 8 weeks   PT Treatment/Interventions ADLs/Self Care Home Management;Electrical  Stimulation;Cryotherapy;Ultrasound;Neuromuscular re-education;Therapeutic exercise;Manual techniques;Scar mobilization;Passive range of motion;Vasopneumatic Device;Patient/family education   PT Next Visit Plan Continue with R shoulder strengthening per MD discretion.   Consulted and Agree with Plan of Care Patient      Patient will benefit from skilled therapeutic intervention in order to improve the following deficits and impairments:  Decreased range of motion, Pain, Impaired UE functional use, Decreased strength  Visit Diagnosis: Stiffness of right shoulder, not elsewhere classified  Acute pain of right shoulder  Muscle weakness (generalized)     Problem List There are no active problems to display for this patient.   Scott Chapman,CHRIS, PTA 03/11/2016, 5:51 PM  [facility] [address] [place], [place], [address] Phone: [phone]   Fax:  [phone]  Name: Scott Chapman MRN: [id number] Date of Birth: 1960-02-01

## 2016-03-16 ENCOUNTER — Ambulatory Visit: Payer: Worker's Compensation | Admitting: *Deleted

## 2016-03-16 DIAGNOSIS — M25611 Stiffness of right shoulder, not elsewhere classified: Secondary | ICD-10-CM

## 2016-03-16 DIAGNOSIS — M6281 Muscle weakness (generalized): Secondary | ICD-10-CM

## 2016-03-16 DIAGNOSIS — M25511 Pain in right shoulder: Secondary | ICD-10-CM

## 2016-03-16 NOTE — Therapy (Signed)
[facility] [place], [place], [address] Phone: [phone]   Fax:  [phone]  Physical Therapy Treatment  Patient Details  Name: Scott Chapman MRN: [id number] Date of Birth: [birth year] Referring Provider: Jerilynn Mages PA-C  Encounter Date: 03/16/2016      PT End of Session - 03/16/16 1656    Visit Number 40   Number of Visits 46   Date for PT Re-Evaluation 04/07/16   Authorization Type To MD 04-07-16   PT Start Time 1645   PT Stop Time 9476   PT Time Calculation (min) 53 min      Past Medical History:  Diagnosis Date  . Allergy   . Hyperlipidemia   . Hypertension     Past Surgical History:  Procedure Laterality Date  . Colapsed lung Right   . HERNIA REPAIR    . left arm surgery    . TENDON REPAIR Right    arm    There were no vitals filed for this visit.      Subjective Assessment - 03/16/16 1655    Subjective Reports "a little bit" of R shoulder soreness., Doing better though.   Pertinent History HTN, asthma   Currently in Pain? Yes   Pain Score 2    Pain Location Shoulder   Pain Orientation Right   Pain Type Surgical pain   Pain Onset More than a month ago   Pain Frequency Intermittent                         [facility] Adult PT Treatment/Exercise - 03/16/16 0001      Exercises   Exercises Shoulder;Elbow     Elbow Exercises   Elbow Flexion Strengthening;Right  5# 6x 10   Elbow Extension Theraband;Strengthening;Both  XTS orange 3x 20     Shoulder Exercises: Supine   Protraction Strengthening;Right;Weights   Protraction Weight (lbs) 4   Protraction Limitations 6 x10 reps     Shoulder Exercises: Prone   Retraction Strengthening;Right;Weights  3# 6x10   Retraction Weight (lbs) 5   Retraction Limitations 6 x10 reps   Extension Strengthening;Right;Weights  6 x10 reps   Extension Weight (lbs) 4     Shoulder Exercises: Sidelying   External Rotation  Strengthening;Right;Weights   External Rotation Weight (lbs) 2   External Rotation Limitations 6x10 reps     Shoulder Exercises: Standing   Flexion Strengthening;Weights;Right  6 x10   Shoulder Flexion Weight (lbs) 3     Shoulder Exercises: Pulleys   Flexion --  x5 min   Other Pulley Exercises UE ranger flex/CW and CCW circles2  x30 reps each     Shoulder Exercises: Therapy Ball   Other Therapy Ball Exercises Wall push-ups 3x20     Shoulder Exercises: ROM/Strengthening   UBE (Upper Arm Bike) UBE 90 RPM x8 min                   PT Short Term Goals - 03/11/16 1742      PT SHORT TERM GOAL #2   Title PROM of R shoulder to Protocol limits   Time 4   Period Weeks   Status Achieved           PT Long Term Goals - 02/24/16 1726      PT LONG TERM GOAL #1   Title R shoulder ROM WFL to perform ADLs   Time 8   Period Weeks   Status Achieved  PT LONG TERM GOAL #2   Title R shoulder strength 5/5 to allow RTW   Time 8   Period Weeks   Status Partially Met  R shoulder flex, IR MMT 5/5, ER 4+/5 02/24/2016     PT LONG TERM GOAL #3   Title Able to peform ADLS with pain 4/01  or less in R shoulder   Time 8   Period Weeks   Status Achieved               Plan - 03/16/16 1757    Clinical Impression Statement Pt arrived to clinic today with normal soreness in RT shldr and was able to progress with some increased PREs again with certain Exs. sets and Reps remained at 6x10. No set-backs today.    Clinical Impairments Affecting Rehab Potential R biceps tendon repair 2015 current surgery 10/14/15    PT Frequency 2x / week   PT Duration 8 weeks   PT Treatment/Interventions ADLs/Self Care Home Management;Electrical Stimulation;Cryotherapy;Ultrasound;Neuromuscular re-education;Therapeutic exercise;Manual techniques;Scar mobilization;Passive range of motion;Vasopneumatic Device;Patient/family education   PT Next Visit Plan Continue with R shoulder strengthening per MD  discretion.   Consulted and Agree with Plan of Care Patient      Patient will benefit from skilled therapeutic intervention in order to improve the following deficits and impairments:  Decreased range of motion, Pain, Impaired UE functional use, Decreased strength  Visit Diagnosis: Stiffness of right shoulder, not elsewhere classified  Acute pain of right shoulder  Muscle weakness (generalized)     Problem List There are no active problems to display for this patient.   RAMSEUR,CHRIS, PTA 03/16/2016, 6:01 PM  [facility] [place], [place], [address] Phone: [phone]   Fax:  [phone]  Name: Scott Chapman MRN: [id number] Date of Birth: 1960-03-29

## 2016-03-18 ENCOUNTER — Ambulatory Visit: Payer: Worker's Compensation | Admitting: *Deleted

## 2016-03-18 DIAGNOSIS — M25511 Pain in right shoulder: Secondary | ICD-10-CM

## 2016-03-18 DIAGNOSIS — M25611 Stiffness of right shoulder, not elsewhere classified: Secondary | ICD-10-CM

## 2016-03-18 DIAGNOSIS — M6281 Muscle weakness (generalized): Secondary | ICD-10-CM

## 2016-03-18 NOTE — Therapy (Signed)
[facility] [place], [place], [address] Phone: [phone]   Fax:  [phone]  Physical Therapy Treatment  Patient Details  Name: Scott Chapman MRN: [id number] Date of Birth: 05-31-1960 Referring Provider: Jerilynn Mages PA-C  Encounter Date: 03/18/2016      PT End of Session - 03/18/16 2000    Visit Number 41   Number of Visits 46   Date for PT Re-Evaluation 04/07/16   PT Start Time 9326   PT Stop Time 1745   PT Time Calculation (min) 60 min      Past Medical History:  Diagnosis Date  . Allergy   . Hyperlipidemia   . Hypertension     Past Surgical History:  Procedure Laterality Date  . Colapsed lung Right   . HERNIA REPAIR    . left arm surgery    . TENDON REPAIR Right    arm    There were no vitals filed for this visit.      Subjective Assessment - 03/18/16 1959    Subjective Did ok after last Rx, a little sore   Pertinent History HTN, asthma   Currently in Pain? Yes   Pain Score 2    Pain Location Shoulder   Pain Orientation Right   Pain Descriptors / Indicators Sore   Pain Type Surgical pain   Pain Onset More than a month ago   Pain Frequency Intermittent                         [facility] Adult PT Treatment/Exercise - 03/18/16 0001      Elbow Exercises   Elbow Flexion Strengthening;Right  5# 6x 10   Elbow Extension Theraband;Strengthening;Both  XTS orange 3x 20     Shoulder Exercises: Supine   Protraction Strengthening;Right;Weights   Protraction Weight (lbs) 4   Protraction Limitations 6 x10 reps     Shoulder Exercises: Sidelying   External Rotation Strengthening;Right;Weights   External Rotation Weight (lbs) 2   External Rotation Limitations 6x10 reps     Shoulder Exercises: Standing   Flexion Strengthening;Weights;Right  6 x10   Shoulder Flexion Weight (lbs) 3     Shoulder Exercises: Pulleys   Flexion --  x5 min   Other Pulley Exercises UE ranger flex/CW  and CCW circles2  x30 reps each     Shoulder Exercises: Therapy Ball   Other Therapy Ball Exercises Wall push-ups 3x20     Shoulder Exercises: ROM/Strengthening   UBE (Upper Arm Bike) UBE 90 RPM x8 min      Manual Therapy   Manual Therapy Passive ROM   Passive ROM PROM for right shoulder flexion/IR/ER and ant cap stretch with end-range holds. Contract/relax used for flexion ROM                  PT Short Term Goals - 03/11/16 1742      PT SHORT TERM GOAL #2   Title PROM of R shoulder to Protocol limits   Time 4   Period Weeks   Status Achieved           PT Long Term Goals - 02/24/16 1726      PT LONG TERM GOAL #1   Title R shoulder ROM WFL to perform ADLs   Time 8   Period Weeks   Status Achieved     PT LONG TERM GOAL #2   Title R shoulder strength 5/5 to allow RTW   Time  8   Period Weeks   Status Partially Met  R shoulder flex, IR MMT 5/5, ER 4+/5 02/24/2016     PT LONG TERM GOAL #3   Title Able to peform ADLS with pain 6/28  or less in R shoulder   Time 8   Period Weeks   Status Achieved               Plan - 03/18/16 2001    Clinical Impression Statement Pt did fairly well again with Therex  today and was able to perform the same Wt and reps  again without any set-backs. His ROM was a little less today due to tightness. His Flexion was 150 degrees, ER 70 and IR to 70 degrees.   Rehab Potential Excellent   Clinical Impairments Affecting Rehab Potential R biceps tendon repair 2015 current surgery 10/14/15    PT Frequency 2x / week   PT Duration 8 weeks   PT Treatment/Interventions ADLs/Self Care Home Management;Electrical Stimulation;Cryotherapy;Ultrasound;Neuromuscular re-education;Therapeutic exercise;Manual techniques;Scar mobilization;Passive range of motion;Vasopneumatic Device;Patient/family education   PT Next Visit Plan Continue with R shoulder strengthening per MD discretion.      Patient will benefit from skilled therapeutic  intervention in order to improve the following deficits and impairments:  Decreased range of motion, Pain, Impaired UE functional use, Decreased strength  Visit Diagnosis: Stiffness of right shoulder, not elsewhere classified  Acute pain of right shoulder  Muscle weakness (generalized)     Problem List There are no active problems to display for this patient.   Jaxan Michel,CHRIS, PTA 03/18/2016, 8:18 PM  [facility] [address] [place], [place], [address] Phone: [phone]   Fax:  [phone]  Name: Scott Chapman MRN: [id number] Date of Birth: 01/18/1960

## 2016-03-23 ENCOUNTER — Ambulatory Visit: Payer: Worker's Compensation | Admitting: *Deleted

## 2016-03-25 ENCOUNTER — Ambulatory Visit: Payer: Worker's Compensation | Admitting: *Deleted

## 2016-03-25 DIAGNOSIS — M25611 Stiffness of right shoulder, not elsewhere classified: Secondary | ICD-10-CM

## 2016-03-25 DIAGNOSIS — M6281 Muscle weakness (generalized): Secondary | ICD-10-CM

## 2016-03-25 DIAGNOSIS — M25511 Pain in right shoulder: Secondary | ICD-10-CM

## 2016-03-25 NOTE — Therapy (Signed)
[facility] [place], [place], [address] Phone: [phone]   Fax:  [phone]  Physical Therapy Treatment  Patient Details  Name: Scott Chapman MRN: [id number] Date of Birth: 09-Jul-1960 Referring Provider: Jerilynn Mages PA-C  Encounter Date: 03/25/2016      PT End of Session - 03/25/16 1706    Visit Number 42   Number of Visits 46   Date for PT Re-Evaluation 04/07/16   Authorization Type To MD 04-07-16   PT Start Time 1645   PT Stop Time 1740   PT Time Calculation (min) 55 min      Past Medical History:  Diagnosis Date  . Allergy   . Hyperlipidemia   . Hypertension     Past Surgical History:  Procedure Laterality Date  . Colapsed lung Right   . HERNIA REPAIR    . left arm surgery    . TENDON REPAIR Right    arm    There were no vitals filed for this visit.      Subjective Assessment - 03/25/16 1704    Subjective Did ok after last Rx, a little sore   Pertinent History HTN, asthma   Currently in Pain? Yes   Pain Score 2    Pain Location Shoulder   Pain Orientation Right   Pain Descriptors / Indicators Sore   Pain Onset More than a month ago                         [facility] Adult PT Treatment/Exercise - 03/25/16 0001      Exercises   Exercises Shoulder;Elbow     Elbow Exercises   Elbow Flexion Strengthening;Right   Elbow Extension Theraband;Strengthening;Both     Shoulder Exercises: Supine   Protraction Strengthening;Right;Weights   Protraction Weight (lbs) 5   Protraction Limitations 6 x10 reps     Shoulder Exercises: Prone   Retraction Strengthening;Right;Weights   Retraction Weight (lbs) 5   Retraction Limitations 6 x10 reps   Extension Strengthening;Right;Weights   Extension Weight (lbs) 4     Shoulder Exercises: Sidelying   External Rotation Strengthening;Right;Weights   External Rotation Weight (lbs) 2   External Rotation Limitations 6x10 reps     Shoulder  Exercises: Standing   Flexion Strengthening;Weights;Right   Shoulder Flexion Weight (lbs) 3     Shoulder Exercises: Pulleys   Other Pulley Exercises UE ranger flex/CW and CCW circles2  x30 reps each     Shoulder Exercises: Therapy Ball   Other Therapy Ball Exercises Wall push-ups 3x20     Shoulder Exercises: ROM/Strengthening   UBE (Upper Arm Bike) UBE 90 RPM x8 min    Ball on Wall 2# ball D1/D2  3 x 10 each     Manual Therapy   Manual Therapy Passive ROM   Passive ROM PROM for right shoulder flexion/IR/ER and ant cap stretch with end-range holds. Contract/relax used for flexion ROM                  PT Short Term Goals - 03/11/16 1742      PT SHORT TERM GOAL #2   Title PROM of R shoulder to Protocol limits   Time 4   Period Weeks   Status Achieved           PT Long Term Goals - 02/24/16 1726      PT LONG TERM GOAL #1   Title R shoulder ROM WFL to perform ADLs  Time 8   Period Weeks   Status Achieved     PT LONG TERM GOAL #2   Title R shoulder strength 5/5 to allow RTW   Time 8   Period Weeks   Status Partially Met  R shoulder flex, IR MMT 5/5, ER 4+/5 02/24/2016     PT LONG TERM GOAL #3   Title Able to peform ADLS with pain 7/51  or less in R shoulder   Time 8   Period Weeks   Status Achieved               Plan - 03/25/16 1718    Clinical Impression Statement Pt did great agin today and was able to perform PREs for RT shldr without increased pain and mainly  fatigue. D1 and D2 exs were added with 2# ball  and did well. His PROM today was 160 degrees for flexion and 75 for ER.Marland Kitchen Pt feels that his RT arm is doing better, but still has a fear of re-injuy.   Rehab Potential Excellent   Clinical Impairments Affecting Rehab Potential R biceps tendon repair 2015 current surgery 10/14/15    PT Frequency 2x / week   PT Duration 8 weeks   PT Treatment/Interventions ADLs/Self Care Home Management;Electrical  Stimulation;Cryotherapy;Ultrasound;Neuromuscular re-education;Therapeutic exercise;Manual techniques;Scar mobilization;Passive range of motion;Vasopneumatic Device;Patient/family education   PT Next Visit Plan Continue with R shoulder strengthening per MD discretion.   PT Home Exercise Plan supine ER/IR with cane, pendulums, elbow flex/ext; AAROM flexion, table slides, AAROM ER.   No new order from MD. We will send Recert to cont.      Consulted and Agree with Plan of Care Patient      Patient will benefit from skilled therapeutic intervention in order to improve the following deficits and impairments:  Decreased range of motion, Pain, Impaired UE functional use, Decreased strength  Visit Diagnosis: Stiffness of right shoulder, not elsewhere classified  Acute pain of right shoulder  Muscle weakness (generalized)     Problem List There are no active problems to display for this patient.   RAMSEUR,CHRIS 03/25/2016, 5:42 PM  [facility] [place], [place], [address] Phone: [phone]   Fax:  [phone]  Name: AARRON WIERZBICKI MRN: [id number] Date of Birth: 1960/10/27

## 2016-03-30 ENCOUNTER — Ambulatory Visit: Payer: Worker's Compensation | Admitting: *Deleted

## 2016-03-30 DIAGNOSIS — M25611 Stiffness of right shoulder, not elsewhere classified: Secondary | ICD-10-CM

## 2016-03-30 DIAGNOSIS — M25511 Pain in right shoulder: Secondary | ICD-10-CM

## 2016-03-30 DIAGNOSIS — M6281 Muscle weakness (generalized): Secondary | ICD-10-CM

## 2016-03-30 NOTE — Therapy (Signed)
[facility] [place], [place], [address] Phone: [phone]   Fax:  [phone]  Physical Therapy Treatment  Patient Details  Name: Scott Chapman MRN: [id number] Date of Birth: 12/31/1960 Referring Provider: Jerilynn Mages PA-C  Encounter Date: 03/30/2016      PT End of Session - 03/30/16 1719    Visit Number 43   Number of Visits 46   Date for PT Re-Evaluation 04/07/16   Authorization Type To MD 04-07-16   PT Start Time 1645   PT Stop Time 1540   PT Time Calculation (min) 59 min      Past Medical History:  Diagnosis Date  . Allergy   . Hyperlipidemia   . Hypertension     Past Surgical History:  Procedure Laterality Date  . Colapsed lung Right   . HERNIA REPAIR    . left arm surgery    . TENDON REPAIR Right    arm    There were no vitals filed for this visit.      Subjective Assessment - 03/30/16 1718    Subjective Did ok after last Rx, a little sore today   Pertinent History HTN, asthma   Currently in Pain? Yes   Pain Score 2    Pain Location Shoulder   Pain Orientation Right   Pain Descriptors / Indicators Sore   Pain Type Surgical pain   Pain Frequency Intermittent                         [facility] Adult PT Treatment/Exercise - 03/30/16 0001      Exercises   Exercises Shoulder;Elbow     Elbow Exercises   Elbow Flexion Strengthening;Right   Elbow Extension Theraband;Strengthening;Both     Shoulder Exercises: Supine   Protraction Strengthening;Right;Weights   Protraction Weight (lbs) 5   Protraction Limitations 6 x10 reps     Shoulder Exercises: Prone   Retraction Strengthening;Right;Weights   Retraction Weight (lbs) 5   Retraction Limitations 6 x10 reps   Extension Strengthening;Right;Weights   Extension Weight (lbs) 4     Shoulder Exercises: Sidelying   External Rotation Strengthening;Right;Weights   External Rotation Weight (lbs) 2   External Rotation Limitations  6x10 reps     Shoulder Exercises: Standing   Flexion Strengthening;Weights;Right   Shoulder Flexion Weight (lbs) 3     Shoulder Exercises: Pulleys   Other Pulley Exercises UE ranger flex/CW and CCW circles2  x30 reps each     Shoulder Exercises: Therapy Ball   Other Therapy Ball Exercises Wall push-ups 3x20     Shoulder Exercises: ROM/Strengthening   UBE (Upper Arm Bike) UBE 90 RPM x8 min      Manual Therapy   Manual Therapy Passive ROM   Passive ROM PROM for right shoulder flexion/IR/ER and ant cap stretch with end-range holds. Contract/relax used for flexion ROM                  PT Short Term Goals - 03/11/16 1742      PT SHORT TERM GOAL #2   Title PROM of R shoulder to Protocol limits   Time 4   Period Weeks   Status Achieved           PT Long Term Goals - 02/24/16 1726      PT LONG TERM GOAL #1   Title R shoulder ROM WFL to perform ADLs   Time 8   Period Weeks   Status  Achieved     PT LONG TERM GOAL #2   Title R shoulder strength 5/5 to allow RTW   Time 8   Period Weeks   Status Partially Met  R shoulder flex, IR MMT 5/5, ER 4+/5 02/24/2016     PT LONG TERM GOAL #3   Title Able to peform ADLS with pain 6/23  or less in R shoulder   Time 8   Period Weeks   Status Achieved               Plan - 03/30/16 1720    Clinical Impression Statement Pt was a little sore today in RT shldr, but was able to complete all exs and Act's. His ROM was still good at 160 degrees and ER at 75 degrees.   Rehab Potential Excellent   Clinical Impairments Affecting Rehab Potential R biceps tendon repair 2015 current surgery 10/14/15    PT Frequency 2x / week   PT Duration 8 weeks   PT Treatment/Interventions ADLs/Self Care Home Management;Electrical Stimulation;Cryotherapy;Ultrasound;Neuromuscular re-education;Therapeutic exercise;Manual techniques;Scar mobilization;Passive range of motion;Vasopneumatic Device;Patient/family education   PT Next Visit Plan  Continue with R shoulder strengthening per MD discretion.   PT Home Exercise Plan supine ER/IR with cane, pendulums, elbow flex/ext; AAROM flexion, table slides, AAROM ER.     Consulted and Agree with Plan of Care Patient      Patient will benefit from skilled therapeutic intervention in order to improve the following deficits and impairments:  Decreased range of motion, Pain, Impaired UE functional use, Decreased strength  Visit Diagnosis: Stiffness of right shoulder, not elsewhere classified  Acute pain of right shoulder  Muscle weakness (generalized)     Problem List There are no active problems to display for this patient.   RAMSEUR,CHRIS, PTA 03/30/2016, 6:03 PM  [facility] [address] [place], [place], [address] Phone: [phone]   Fax:  [phone]  Name: Scott Chapman MRN: [id number] Date of Birth: [birth year]

## 2016-04-01 ENCOUNTER — Ambulatory Visit: Payer: Worker's Compensation | Admitting: *Deleted

## 2016-04-06 ENCOUNTER — Ambulatory Visit: Payer: Worker's Compensation | Admitting: *Deleted

## 2016-04-06 DIAGNOSIS — M25511 Pain in right shoulder: Secondary | ICD-10-CM

## 2016-04-06 DIAGNOSIS — M25611 Stiffness of right shoulder, not elsewhere classified: Secondary | ICD-10-CM | POA: Diagnosis not present

## 2016-04-06 DIAGNOSIS — M6281 Muscle weakness (generalized): Secondary | ICD-10-CM

## 2016-04-06 NOTE — Therapy (Signed)
[facility] [place], [place], [address] Phone: [phone]   Fax:  [phone]  Physical Therapy Treatment  Patient Details  Name: Scott Chapman MRN: [id number] Date of Birth: 05/17/1960 Referring Provider: Jerilynn Mages PA-C  Encounter Date: 04/06/2016      PT End of Session - 04/06/16 1713    Visit Number 45   Number of Visits 58   Date for PT Re-Evaluation 04/07/16   Authorization Type To MD 04-07-16   PT Start Time 1645      Past Medical History:  Diagnosis Date  . Allergy   . Hyperlipidemia   . Hypertension     Past Surgical History:  Procedure Laterality Date  . Colapsed lung Right   . HERNIA REPAIR    . left arm surgery    . TENDON REPAIR Right    arm    There were no vitals filed for this visit.      Subjective Assessment - 04/06/16 1711    Subjective Did ok after last Rx, a little sore today. 2/10   Pertinent History HTN, asthma   Currently in Pain? Yes   Pain Score 2    Pain Location Shoulder   Pain Orientation Right   Pain Onset More than a month ago   Pain Frequency Intermittent            [facility] PT Assessment - 04/06/16 0001      Assessment   Medical Diagnosis s/p R RCR     AROM   AROM Assessment Site Shoulder   Right/Left Shoulder Right   Right Shoulder Flexion 160 Degrees   Right Shoulder Internal Rotation 65 Degrees  HBB L5   Right Shoulder External Rotation 65 Degrees     Strength   Strength Assessment Site Shoulder   Right/Left Shoulder Right   Right Shoulder Flexion 4/5   Right Shoulder Internal Rotation 4/5   Right Shoulder External Rotation 4/5                     [facility] Adult PT Treatment/Exercise - 04/06/16 0001      Exercises   Exercises Shoulder;Elbow     Elbow Exercises   Elbow Flexion Strengthening;Right  5# 6x 10   Elbow Extension Theraband;Strengthening;Both  XTS orange 3x 20     Shoulder Exercises: Supine   Protraction  Strengthening;Right;Weights   Protraction Weight (lbs) 5   Protraction Limitations 6 x10 reps     Shoulder Exercises: Prone   Retraction Strengthening;Right;Weights   Retraction Weight (lbs) 5   Retraction Limitations 6 x10 reps   Extension Strengthening;Right;Weights   Extension Weight (lbs) 4     Shoulder Exercises: Sidelying   External Rotation Strengthening;Right;Weights   External Rotation Weight (lbs) 2   External Rotation Limitations 6x10 reps     Shoulder Exercises: Standing   Flexion Strengthening;Weights;Right   Shoulder Flexion Weight (lbs) 3   Other Standing Exercises RW4 2 x 20 with red band for HEP     Shoulder Exercises: Therapy Ball   Other Therapy Ball Exercises Wall push-ups 3x20     Shoulder Exercises: ROM/Strengthening   UBE (Upper Arm Bike) UBE 90 RPM x8 min    Ball on Wall 4# ball D1/D2  3 x 10 each     Manual Therapy   Manual Therapy Passive ROM   Passive ROM PROM for right shoulder flexion/IR/ER and ant cap stretch with end-range holds. Contract/relax used for flexion ROM  XTS pink rows 3 x 20             PT Short Term Goals - 03/11/16 1742      PT SHORT TERM GOAL #2   Title PROM of R shoulder to Protocol limits   Time 4   Period Weeks   Status Achieved           PT Long Term Goals - 02/24/16 1726      PT LONG TERM GOAL #1   Title R shoulder ROM WFL to perform ADLs   Time 8   Period Weeks   Status Achieved     PT LONG TERM GOAL #2   Title R shoulder strength 5/5 to allow RTW   Time 8   Period Weeks   Status Partially Met  R shoulder flex, IR MMT 5/5, ER 4+/5 02/24/2016     PT LONG TERM GOAL #3   Title Able to peform ADLS with pain 1/73  or less in R shoulder   Time 8   Period Weeks   Status Achieved               Plan - 04/06/16 1717    Clinical Impression Statement Pt did fairly well today with therex and PRE's and was able to increase resistance with D1/D2  diagonals. His AROM was flexion 160, Er  65, and IR 65 degrees. Strength 4/5 grossly. All goals met except strength goal. due to deficits   Rehab Potential Excellent   Clinical Impairments Affecting Rehab Potential R biceps tendon repair 2015 current surgery 10/14/15    PT Frequency 2x / week   PT Duration 8 weeks   PT Treatment/Interventions ADLs/Self Care Home Management;Electrical Stimulation;Cryotherapy;Ultrasound;Neuromuscular re-education;Therapeutic exercise;Manual techniques;Scar mobilization;Passive range of motion;Vasopneumatic Device;Patient/family education   PT Next Visit Plan Continue with R shoulder strengthening per MD discretion.    MD tomorrow send note   PT Home Exercise Plan supine ER/IR with cane, pendulums, elbow flex/ext; AAROM flexion, table slides, AAROM ER.     Consulted and Agree with Plan of Care Patient      Patient will benefit from skilled therapeutic intervention in order to improve the following deficits and impairments:  Decreased range of motion, Pain, Impaired UE functional use, Decreased strength  Visit Diagnosis: Stiffness of right shoulder, not elsewhere classified  Acute pain of right shoulder  Muscle weakness (generalized)     Problem List There are no active problems to display for this patient.   Chapman, Scott, PTA 04/06/2016, 6:11 PM Scott Chapman MPT [facility] [address] [place], [place], [address] Phone: [phone]   Fax:  [phone]  Name: Scott Chapman MRN: [id number] Date of Birth: 06-07-1960

## 2016-04-08 ENCOUNTER — Ambulatory Visit: Payer: Worker's Compensation | Admitting: *Deleted

## 2016-04-08 DIAGNOSIS — M25511 Pain in right shoulder: Secondary | ICD-10-CM

## 2016-04-08 DIAGNOSIS — M6281 Muscle weakness (generalized): Secondary | ICD-10-CM

## 2016-04-08 DIAGNOSIS — M25611 Stiffness of right shoulder, not elsewhere classified: Secondary | ICD-10-CM | POA: Diagnosis not present

## 2016-04-08 NOTE — Therapy (Signed)
[facility] [place], [place], [address] Phone: [phone]   Fax:  [phone]  Physical Therapy Treatment  Patient Details  Name: Scott Chapman MRN: [id number] Date of Birth: 1960/04/25 Referring Provider: Jerilynn Mages PA-C  Encounter Date: 04/08/2016      PT End of Session - 04/08/16 1754    Visit Number 45   Number of Visits 56   Date for PT Re-Evaluation 05/06/16   PT Start Time 7341   PT Stop Time 9379   PT Time Calculation (min) 53 min      Past Medical History:  Diagnosis Date  . Allergy   . Hyperlipidemia   . Hypertension     Past Surgical History:  Procedure Laterality Date  . Colapsed lung Right   . HERNIA REPAIR    . left arm surgery    . TENDON REPAIR Right    arm    There were no vitals filed for this visit.      Subjective Assessment - 04/08/16 1753    Subjective MD was pleased with  current status. N.O. to cont. PT x 4 more weeks.   Pertinent History HTN, asthma   Currently in Pain? Yes   Pain Score 2    Pain Location Shoulder   Pain Orientation Right   Pain Descriptors / Indicators Sore   Pain Type Surgical pain                         [facility] Adult PT Treatment/Exercise - 04/08/16 0001      Exercises   Exercises Shoulder;Elbow     Elbow Exercises   Elbow Flexion Strengthening;Right  6x10   Bar Weights/Barbell (Elbow Flexion) 5 lbs   Elbow Extension Strengthening;Both  6x10     Shoulder Exercises: Prone   Retraction Strengthening;Right;Weights   Retraction Weight (lbs) 6   Retraction Limitations 6 x10 reps   Extension Strengthening;Right;Weights  6x10   Extension Weight (lbs) 5     Shoulder Exercises: Standing   Flexion Strengthening;Weights;Right  6x10   Shoulder Flexion Weight (lbs) 4     Shoulder Exercises: Pulleys   Other Pulley Exercises UE ranger flex/CW and CCW circles2  x30 reps each     Shoulder Exercises: Therapy Ball   Other  Therapy Ball Exercises Wall push-ups 4x20     Shoulder Exercises: ROM/Strengthening   UBE (Upper Arm Bike) UBE 90 RPM x8 min      Manual Therapy   Manual Therapy Passive ROM   Passive ROM PROM for right shoulder flexion/IR/ER and ant cap stretch with end-range holds. Contract/relax used for flexion ROM                  PT Short Term Goals - 03/11/16 1742      PT SHORT TERM GOAL #2   Title PROM of R shoulder to Protocol limits   Time 4   Period Weeks   Status Achieved           PT Long Term Goals - 02/24/16 1726      PT LONG TERM GOAL #1   Title R shoulder ROM WFL to perform ADLs   Time 8   Period Weeks   Status Achieved     PT LONG TERM GOAL #2   Title R shoulder strength 5/5 to allow RTW   Time 8   Period Weeks   Status Partially Met  R shoulder flex, IR MMT 5/5,  ER 4+/5 02/24/2016     PT LONG TERM GOAL #3   Title Able to peform ADLS with pain 1/61  or less in R shoulder   Time 8   Period Weeks   Status Achieved               Plan - 04/08/16 1755    Clinical Impression Statement  Pt arrived  today with a new MD order to continue with PT for 4 more weeks. MD was pleased wiyh current status. Pt was able to perform PREs with mainly fatigue at the end of Rx. resistanmce was addid again to some EXs.   Rehab Potential Excellent   Clinical Impairments Affecting Rehab Potential R biceps tendon repair 2015 current surgery 10/14/15    PT Frequency 2x / week   PT Duration 8 weeks   PT Treatment/Interventions ADLs/Self Care Home Management;Electrical Stimulation;Cryotherapy;Ultrasound;Neuromuscular re-education;Therapeutic exercise;Manual techniques;Scar mobilization;Passive range of motion;Vasopneumatic Device;Patient/family education   PT Next Visit Plan Continue with R shoulder strengthening per MD discretion.      Consulted and Agree with Plan of Care Patient      Patient will benefit from skilled therapeutic intervention in order to improve the  following deficits and impairments:  Decreased range of motion, Pain, Impaired UE functional use, Decreased strength  Visit Diagnosis: Stiffness of right shoulder, not elsewhere classified  Acute pain of right shoulder  Muscle weakness (generalized)     Problem List There are no active problems to display for this patient.   Adoni Greenough,CHRIS 04/08/2016, 6:06 PM  [facility] [place], [place], [address] Phone: [phone]   Fax:  [phone]  Name: Scott Chapman MRN: [id number] Date of Birth: [birth year]

## 2016-04-13 ENCOUNTER — Ambulatory Visit: Payer: Worker's Compensation | Admitting: *Deleted

## 2016-04-20 ENCOUNTER — Encounter: Payer: Self-pay | Admitting: Physical Therapy

## 2016-04-20 ENCOUNTER — Ambulatory Visit: Payer: Worker's Compensation | Attending: Orthopedic Surgery | Admitting: Physical Therapy

## 2016-04-20 DIAGNOSIS — M25611 Stiffness of right shoulder, not elsewhere classified: Secondary | ICD-10-CM | POA: Diagnosis present

## 2016-04-20 DIAGNOSIS — M25511 Pain in right shoulder: Secondary | ICD-10-CM | POA: Insufficient documentation

## 2016-04-20 DIAGNOSIS — M6281 Muscle weakness (generalized): Secondary | ICD-10-CM | POA: Diagnosis present

## 2016-04-20 NOTE — Therapy (Signed)
[facility] [place], [place], [address] Phone: [phone]   Fax:  [phone]  Physical Therapy Treatment  Patient Details  Name: Scott Chapman MRN: [id number] Date of Birth: 1960-03-16 Referring Provider: Jerilynn Mages PA-C  Encounter Date: 04/20/2016      PT End of Session - 04/20/16 1645    Visit Number 46   Number of Visits 56   Date for PT Re-Evaluation 05/06/16   PT Start Time 1645   PT Stop Time 1727   PT Time Calculation (min) 42 min   Activity Tolerance Patient tolerated treatment well   Behavior During Therapy [facility] for tasks assessed/performed      Past Medical History:  Diagnosis Date  . Allergy   . Hyperlipidemia   . Hypertension     Past Surgical History:  Procedure Laterality Date  . Colapsed lung Right   . HERNIA REPAIR    . left arm surgery    . TENDON REPAIR Right    arm    There were no vitals filed for this visit.      Subjective Assessment - 04/20/16 1646    Subjective Reports that shoulder is still sore at night. 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 Reports weakness still in R shoulder. Reports that he cannot lift over 10# or do overhead work. Patient states that with leaf blower or weed eater the RUE is helping guide but he pulls and has straps over LUE.   Pertinent History HTN, asthma   Currently in Pain? Yes   Pain Score 2    Pain Location Shoulder   Pain Orientation Right   Pain Descriptors / Indicators Sore   Pain Type Surgical pain   Pain Onset More than a month ago   Pain Frequency Intermittent            [facility] PT Assessment - 04/20/16 0001      Assessment   Medical Diagnosis s/p R RCR   Onset Date/Surgical Date 10/14/15   Hand Dominance Right   Next MD Visit 05/2016     Precautions   Precautions Shoulder   Type of Shoulder Precautions RCR see protocol                     [facility] Adult PT Treatment/Exercise - 04/20/16 0001      Elbow Exercises   Elbow Flexion Strengthening;Right   Bar Weights/Barbell (Elbow Flexion) 5 lbs   Elbow Flexion Limitations 6x10 reps   Elbow Extension Strengthening;Both   Elbow Extension Limitations Pink XTS 6x10 reps     Shoulder Exercises: Prone   Retraction Strengthening;Right;Weights   Retraction Weight (lbs) 6   Retraction Limitations 6 x10 reps   Extension Strengthening;Right;Weights  6x10 reps   Extension Weight (lbs) 5     Shoulder Exercises: Standing   Protraction Strengthening;Right;Theraband   Theraband Level (Shoulder Protraction) Level 3 (Green)   Protraction Limitations 5x10 reps   Flexion Strengthening;Both;Weights   Shoulder Flexion Weight (lbs) 4   Flexion Limitations 6x10 reps     Shoulder Exercises: Pulleys   Other Pulley Exercises UE ranger flex/CW and CCW circles2  x30 reps each     Shoulder Exercises: ROM/Strengthening   UBE (Upper Arm Bike) UBE 90 RPM x8 min    Wall Wash x20 reps CW and CCW    Wall Pushups Other (comment)  3x10 reps   Ball on Wall 4# ball D1/D2  3 x 10 each  PT Short Term Goals - 03/11/16 1742      PT SHORT TERM GOAL #2   Title PROM of R shoulder to Protocol limits   Time 4   Period Weeks   Status Achieved           PT Long Term Goals - 02/24/16 1726      PT LONG TERM GOAL #1   Title R shoulder ROM WFL to perform ADLs   Time 8   Period Weeks   Status Achieved     PT LONG TERM GOAL #2   Title R shoulder strength 5/5 to allow RTW   Time 8   Period Weeks   Status Partially Met  R shoulder flex, IR MMT 5/5, ER 4+/5 02/24/2016     PT LONG TERM GOAL #3   Title Able to peform ADLS with pain 3/76  or less in R shoulder   Time 8   Period Weeks   Status Achieved               Plan - 04/20/16 1734    Clinical Impression Statement Patient tolerated today's treatment well with low level R shoulder soreness. Patient able to complete exercises with hand weights with increased repititions  well with only fatigue reported. Good technique noted throughout treatment today.   Rehab Potential Excellent   Clinical Impairments Affecting Rehab Potential R biceps tendon repair 2015 current surgery 10/14/15    PT Frequency 2x / week   PT Duration 8 weeks   PT Treatment/Interventions ADLs/Self Care Home Management;Electrical Stimulation;Cryotherapy;Ultrasound;Neuromuscular re-education;Therapeutic exercise;Manual techniques;Scar mobilization;Passive range of motion;Vasopneumatic Device;Patient/family education   PT Next Visit Plan Continue with R shoulder strengthening per MD discretion.      PT Home Exercise Plan supine ER/IR with cane, pendulums, elbow flex/ext; AAROM flexion, table slides, AAROM ER.     Consulted and Agree with Plan of Care Patient      Patient will benefit from skilled therapeutic intervention in order to improve the following deficits and impairments:  Decreased range of motion, Pain, Impaired UE functional use, Decreased strength  Visit Diagnosis: Stiffness of right shoulder, not elsewhere classified  Acute pain of right shoulder  Muscle weakness (generalized)     Problem List There are no active problems to display for this patient.   Wynelle Fanny, PTA 04/20/2016, 5:39 PM  [facility] [address] [place], [place], [address] Phone: [phone]   Fax:  [phone]  Name: Scott Chapman MRN: [id number] Date of Birth: [birth year]

## 2016-04-22 ENCOUNTER — Ambulatory Visit: Payer: Worker's Compensation | Admitting: *Deleted

## 2016-04-22 DIAGNOSIS — M25511 Pain in right shoulder: Secondary | ICD-10-CM

## 2016-04-22 DIAGNOSIS — M25611 Stiffness of right shoulder, not elsewhere classified: Secondary | ICD-10-CM

## 2016-04-22 DIAGNOSIS — M6281 Muscle weakness (generalized): Secondary | ICD-10-CM

## 2016-04-22 NOTE — Therapy (Signed)
[facility] [place], [place], [address] Phone: [phone]   Fax:  [phone]  Physical Therapy Treatment  Patient Details  Name: Scott Chapman MRN: [id number] Date of Birth: 1960/07/27 Referring Provider: Jerilynn Mages PA-C  Encounter Date: 04/22/2016      PT End of Session - 04/22/16 1700    Visit Number 63   Number of Visits 56   Date for PT Re-Evaluation 05/06/16   Authorization Type To MD 04-07-16   PT Start Time 1645   PT Stop Time 1735   PT Time Calculation (min) 50 min      Past Medical History:  Diagnosis Date  . Allergy   . Hyperlipidemia   . Hypertension     Past Surgical History:  Procedure Laterality Date  . Colapsed lung Right   . HERNIA REPAIR    . left arm surgery    . TENDON REPAIR Right    arm    There were no vitals filed for this visit.      Subjective Assessment - 04/22/16 1657    Subjective Still doing fairly well RT shldr is sore. 2/10 Doing ok at work   Pertinent History HTN, asthma   Currently in Pain? Yes   Pain Score 2    Pain Location Shoulder   Pain Orientation Right   Pain Descriptors / Indicators Sore   Pain Type Surgical pain   Pain Onset More than a month ago   Pain Frequency Intermittent                         [facility] Adult PT Treatment/Exercise - 04/22/16 0001      Exercises   Exercises Shoulder;Elbow     Elbow Exercises   Elbow Flexion Strengthening;Right   Bar Weights/Barbell (Elbow Flexion) 5 lbs   Elbow Flexion Limitations 6x10   Elbow Extension Strengthening;Both   Elbow Extension Limitations  Pink XTS 4x20     Shoulder Exercises: Prone   Retraction Strengthening;Right;Weights   Retraction Weight (lbs) 6#   Retraction Limitations 6 x10 reps   Extension Strengthening;Right;Weights  6x10 reps   Extension Weight (lbs) 5#     Shoulder Exercises: Sidelying   External Rotation Strengthening;Right;Weights   External Rotation  Weight (lbs) 2   External Rotation Limitations 6x10 reps     Shoulder Exercises: Standing   Protraction Strengthening;Right;Theraband   Flexion Strengthening;Both;Weights   Shoulder Flexion Weight (lbs) 4   Flexion Limitations 6x10 reps     Shoulder Exercises: Therapy Ball   Other Therapy Ball Exercises Wall push-ups 4x20     Shoulder Exercises: ROM/Strengthening   UBE (Upper Arm Bike) UBE 90 RPM x8 min    Wall Pushups --   Ball on Wall 4# ball D1/D2  3 x 20 each                  PT Short Term Goals - 03/11/16 1742      PT SHORT TERM GOAL #2   Title PROM of R shoulder to Protocol limits   Time 4   Period Weeks   Status Achieved           PT Long Term Goals - 02/24/16 1726      PT LONG TERM GOAL #1   Title R shoulder ROM WFL to perform ADLs   Time 8   Period Weeks   Status Achieved     PT LONG TERM GOAL #2  Title R shoulder strength 5/5 to allow RTW   Time 8   Period Weeks   Status Partially Met  R shoulder flex, IR MMT 5/5, ER 4+/5 02/24/2016     PT LONG TERM GOAL #3   Title Able to peform ADLS with pain 5/69  or less in R shoulder   Time 8   Period Weeks   Status Achieved               Plan - 04/22/16 1736    Clinical Impression Statement Pt arrived to clinic today feeling pretty good with mainly soreness in RT shldr. He was able to complete Exs and act's for RT shldr strengthening with no increase in pain and mainly fatigue. HBB IR motion has slowly improved to L3-4   Rehab Potential Excellent   Clinical Impairments Affecting Rehab Potential R biceps tendon repair 2015 current surgery 10/14/15    PT Frequency 2x / week   PT Duration 8 weeks   PT Treatment/Interventions ADLs/Self Care Home Management;Electrical Stimulation;Cryotherapy;Ultrasound;Neuromuscular re-education;Therapeutic exercise;Manual techniques;Scar mobilization;Passive range of motion;Vasopneumatic Device;Patient/family education   PT Next Visit Plan Continue with R  shoulder strengthening per MD discretion.      PT Home Exercise Plan supine ER/IR with cane, pendulums, elbow flex/ext; AAROM flexion, table slides, AAROM ER.     Consulted and Agree with Plan of Care Patient      Patient will benefit from skilled therapeutic intervention in order to improve the following deficits and impairments:  Decreased range of motion, Pain, Impaired UE functional use, Decreased strength  Visit Diagnosis: Stiffness of right shoulder, not elsewhere classified  Acute pain of right shoulder  Muscle weakness (generalized)     Problem List There are no active problems to display for this patient.   Dajanay Northrup,CHRIS, PTA 04/22/2016, 5:43 PM  [facility] [address] [place], [place], [address] Phone: [phone]   Fax:  [phone]  Name: ZED WANNINGER MRN: [id number] Date of Birth: 11/10/1960

## 2016-04-27 ENCOUNTER — Ambulatory Visit: Payer: Worker's Compensation | Admitting: *Deleted

## 2016-04-27 DIAGNOSIS — M25611 Stiffness of right shoulder, not elsewhere classified: Secondary | ICD-10-CM | POA: Diagnosis not present

## 2016-04-27 DIAGNOSIS — M25511 Pain in right shoulder: Secondary | ICD-10-CM

## 2016-04-27 DIAGNOSIS — M6281 Muscle weakness (generalized): Secondary | ICD-10-CM

## 2016-04-27 NOTE — Therapy (Signed)
[facility] [place], [place], [address] Phone: [phone]   Fax:  [phone]  Physical Therapy Treatment  Patient Details  Name: Scott Chapman MRN: [id number] Date of Birth: July 05, 1960 Referring Provider: Jerilynn Mages PA-C  Encounter Date: 04/27/2016      PT End of Session - 04/27/16 1648    Visit Number 48   Number of Visits 56   Date for PT Re-Evaluation 05/06/16   Authorization Type To MD 5 -18   PT Start Time 1645   PT Stop Time 1737   PT Time Calculation (min) 52 min      Past Medical History:  Diagnosis Date  . Allergy   . Hyperlipidemia   . Hypertension     Past Surgical History:  Procedure Laterality Date  . Colapsed lung Right   . HERNIA REPAIR    . left arm surgery    . TENDON REPAIR Right    arm    There were no vitals filed for this visit.                       [facility] Adult PT Treatment/Exercise - 04/27/16 0001      Exercises   Exercises Shoulder;Elbow     Elbow Exercises   Elbow Flexion Strengthening;Right  3x20   Bar Weights/Barbell (Elbow Flexion) 5 lbs   Elbow Extension Strengthening;Both  6x10   Elbow Extension Limitations Pink XTS 4x 20     Shoulder Exercises: Prone   Retraction Strengthening;Right;Weights   Retraction Weight (lbs) 6#   Retraction Limitations 6 x10 reps   Extension Strengthening;Right;Weights   Extension Weight (lbs) 6#     Shoulder Exercises: Sidelying   External Rotation Strengthening;Right;Weights   External Rotation Weight (lbs) 2   External Rotation Limitations 6x10 reps     Shoulder Exercises: Standing   Protraction Strengthening;Right;Theraband   Flexion Strengthening;Both;Weights   Shoulder Flexion Weight (lbs) 5   Flexion Limitations 6x10 reps     Shoulder Exercises: Therapy Ball   Other Therapy Ball Exercises Wall push-ups 4x20     Shoulder Exercises: ROM/Strengthening   UBE (Upper Arm Bike) UBE RPM x8 min    Ball  on Wall 4# ball D1/D2  3 x 20 each                  PT Short Term Goals - 03/11/16 1742      PT SHORT TERM GOAL #2   Title PROM of R shoulder to Protocol limits   Time 4   Period Weeks   Status Achieved           PT Long Term Goals - 02/24/16 1726      PT LONG TERM GOAL #1   Title R shoulder ROM WFL to perform ADLs   Time 8   Period Weeks   Status Achieved     PT LONG TERM GOAL #2   Title R shoulder strength 5/5 to allow RTW   Time 8   Period Weeks   Status Partially Met  R shoulder flex, IR MMT 5/5, ER 4+/5 02/24/2016     PT LONG TERM GOAL #3   Title Able to peform ADLS with pain 1/02  or less in R shoulder   Time 8   Period Weeks   Status Achieved               Plan - 04/27/16 1704    Clinical Impression Statement Pt arrived  to clinic today doing fairly well with minimal pain in RT shldr 2/10. He was able to complete all therex and act.'s for RT shldr. We were able to use 5 and 6#s for most PRE.'s.   Rehab Potential Excellent   Clinical Impairments Affecting Rehab Potential R biceps tendon repair 2015 current surgery 10/14/15    PT Frequency 2x / week   PT Duration 8 weeks   PT Treatment/Interventions ADLs/Self Care Home Management;Electrical Stimulation;Cryotherapy;Ultrasound;Neuromuscular re-education;Therapeutic exercise;Manual techniques;Scar mobilization;Passive range of motion;Vasopneumatic Device;Patient/family education      Patient will benefit from skilled therapeutic intervention in order to improve the following deficits and impairments:  Decreased range of motion, Pain, Impaired UE functional use, Decreased strength  Visit Diagnosis: Stiffness of right shoulder, not elsewhere classified  Acute pain of right shoulder  Muscle weakness (generalized)     Problem List There are no active problems to display for this patient.   Boston Cookson,Scott Chapman, PTA 04/27/2016, 5:41 PM  [facility] [address] [place], [place], [address] Phone: [phone]   Fax:  [phone]  Name: Scott Chapman MRN: [id number] Date of Birth: Nov 24, 1960

## 2016-04-29 ENCOUNTER — Ambulatory Visit: Payer: Worker's Compensation | Admitting: Physical Therapy

## 2016-04-29 ENCOUNTER — Encounter: Payer: Self-pay | Admitting: Physical Therapy

## 2016-04-29 DIAGNOSIS — M25511 Pain in right shoulder: Secondary | ICD-10-CM

## 2016-04-29 DIAGNOSIS — M25611 Stiffness of right shoulder, not elsewhere classified: Secondary | ICD-10-CM

## 2016-04-29 DIAGNOSIS — M6281 Muscle weakness (generalized): Secondary | ICD-10-CM

## 2016-04-29 NOTE — Therapy (Signed)
[facility] [place], [place], [address] Phone: [phone]   Fax:  [phone]  Physical Therapy Treatment  Patient Details  Name: LANTZ HERMANN MRN: [id number] Date of Birth: Sep 26, 1960 Referring Provider: Jerilynn Mages PA-C  Encounter Date: 04/29/2016      PT End of Session - 04/29/16 1650    Visit Number 32   Number of Visits 56   Date for PT Re-Evaluation 05/06/16   Authorization Type To MD 5 -18   PT Start Time 1645   PT Stop Time 1726   PT Time Calculation (min) 41 min   Activity Tolerance Patient tolerated treatment well   Behavior During Therapy [facility] for tasks assessed/performed      Past Medical History:  Diagnosis Date  . Allergy   . Hyperlipidemia   . Hypertension     Past Surgical History:  Procedure Laterality Date  . Colapsed lung Right   . HERNIA REPAIR    . left arm surgery    . TENDON REPAIR Right    arm    There were no vitals filed for this visit.      Subjective Assessment - 04/29/16 1650    Subjective Reports he is determined to do 60 RPM on UBE.   Pertinent History HTN, asthma   Currently in Pain? Other (Comment)  No pain assessment required            [facility] PT Assessment - 04/29/16 0001      Assessment   Medical Diagnosis s/p R RCR   Onset Date/Surgical Date 10/14/15   Hand Dominance Right   Next MD Visit 05/2016     Precautions   Precautions Shoulder   Type of Shoulder Precautions RCR see protocol                     [facility] Adult PT Treatment/Exercise - 04/29/16 0001      Elbow Exercises   Elbow Flexion Strengthening;Right   Bar Weights/Barbell (Elbow Flexion) 5 lbs   Elbow Flexion Limitations 6x10 reps   Elbow Extension Strengthening;Both   Elbow Extension Limitations Orange XTS 8x10 reps     Shoulder Exercises: Prone   Retraction Strengthening;Right;Weights   Retraction Weight (lbs) 6#   Retraction Limitations 6 x10 reps   Extension  Strengthening;Right;Weights   Extension Weight (lbs) 6#     Shoulder Exercises: Sidelying   External Rotation Strengthening;Right;Weights   External Rotation Weight (lbs) 2   External Rotation Limitations 6x10 reps     Shoulder Exercises: Standing   Protraction Strengthening;Right;Theraband   Theraband Level (Shoulder Protraction) Level 3 (Green)   Protraction Limitations 6x10 reps   Flexion Strengthening;Both;Weights   Shoulder Flexion Weight (lbs) 5   Flexion Limitations 6x10 reps   Other Standing Exercises R shoulder D1 and D2 4# 3x10 reps     Shoulder Exercises: ROM/Strengthening   UBE (Upper Arm Bike) UBE 60-90 RPM x6 min   Wall Wash x20 reps CW and CCW    Wall Pushups Other (comment)  3x10 reps   Other ROM/Strengthening Exercises Wall walks green theraband x2 RT                  PT Short Term Goals - 03/11/16 1742      PT SHORT TERM GOAL #2   Title PROM of R shoulder to Protocol limits   Time 4   Period Weeks   Status Achieved  PT Long Term Goals - 02/24/16 1726      PT LONG TERM GOAL #1   Title R shoulder ROM WFL to perform ADLs   Time 8   Period Weeks   Status Achieved     PT LONG TERM GOAL #2   Title R shoulder strength 5/5 to allow RTW   Time 8   Period Weeks   Status Partially Met  R shoulder flex, IR MMT 5/5, ER 4+/5 02/24/2016     PT LONG TERM GOAL #3   Title Able to peform ADLS with pain 7/68  or less in R shoulder   Time 8   Period Weeks   Status Achieved               Plan - 04/29/16 1731    Clinical Impression Statement Patient arrived to clinic with no complaints regarding R shoulder and did very well with all exercises. Patient able to tolerate increased reps and resistance with only complaints of fatigue. Only reports of fatigue upon end of session.   Rehab Potential Excellent   Clinical Impairments Affecting Rehab Potential R biceps tendon repair 2015 current surgery 10/14/15    PT Frequency 2x / week   PT  Duration 8 weeks   PT Treatment/Interventions ADLs/Self Care Home Management;Electrical Stimulation;Cryotherapy;Ultrasound;Neuromuscular re-education;Therapeutic exercise;Manual techniques;Scar mobilization;Passive range of motion;Vasopneumatic Device;Patient/family education   PT Next Visit Plan Continue with R shoulder strengthening per MD discretion.      PT Home Exercise Plan supine ER/IR with cane, pendulums, elbow flex/ext; AAROM flexion, table slides, AAROM ER.     Consulted and Agree with Plan of Care Patient      Patient will benefit from skilled therapeutic intervention in order to improve the following deficits and impairments:  Decreased range of motion, Pain, Impaired UE functional use, Decreased strength  Visit Diagnosis: Stiffness of right shoulder, not elsewhere classified  Acute pain of right shoulder  Muscle weakness (generalized)     Problem List There are no active problems to display for this patient.   Wynelle Fanny, PTA 04/29/2016, 5:36 PM  [facility] [address] [place], [place], [address] Phone: [phone]   Fax:  [phone]  Name: MORRISON MCBRYAR MRN: [id number] Date of Birth: [birth year]

## 2016-05-04 ENCOUNTER — Encounter: Payer: Self-pay | Admitting: Physical Therapy

## 2016-05-04 ENCOUNTER — Ambulatory Visit: Payer: Worker's Compensation | Admitting: Physical Therapy

## 2016-05-04 DIAGNOSIS — M6281 Muscle weakness (generalized): Secondary | ICD-10-CM

## 2016-05-04 DIAGNOSIS — M25611 Stiffness of right shoulder, not elsewhere classified: Secondary | ICD-10-CM

## 2016-05-04 DIAGNOSIS — M25511 Pain in right shoulder: Secondary | ICD-10-CM

## 2016-05-04 NOTE — Therapy (Signed)
[facility] [place], [place], [address] Phone: [phone]   Fax:  [phone]  Physical Therapy Treatment  Patient Details  Name: Scott Chapman MRN: [id number] Date of Birth: 28-Jul-1960 Referring Provider: Jerilynn Mages PA-C  Encounter Date: 05/04/2016      PT End of Session - 05/04/16 1646    Visit Number 50   Number of Visits 56   Date for PT Re-Evaluation 05/06/16   Authorization Type To MD 5 -18   PT Start Time 1645   PT Stop Time 1727   PT Time Calculation (min) 42 min   Activity Tolerance Patient tolerated treatment well   Behavior During Therapy [facility] for tasks assessed/performed      Past Medical History:  Diagnosis Date  . Allergy   . Hyperlipidemia   . Hypertension     Past Surgical History:  Procedure Laterality Date  . Colapsed lung Right   . HERNIA REPAIR    . left arm surgery    . TENDON REPAIR Right    arm    There were no vitals filed for this visit.      Subjective Assessment - 05/04/16 1645    Subjective Reports 2/10 shoulder pain today.   Pertinent History HTN, asthma   Currently in Pain? Yes   Pain Score 2    Pain Location Shoulder   Pain Orientation Right   Pain Descriptors / Indicators Discomfort   Pain Type Surgical pain   Pain Onset More than a month ago            [facility] PT Assessment - 05/04/16 0001      Assessment   Medical Diagnosis s/p R RCR   Onset Date/Surgical Date 10/14/15   Hand Dominance Right   Next MD Visit 05/2016     Precautions   Precautions Shoulder   Type of Shoulder Precautions RCR see protocol                     [facility] Adult PT Treatment/Exercise - 05/04/16 0001      Elbow Exercises   Elbow Flexion Strengthening;Right   Bar Weights/Barbell (Elbow Flexion) Other (comment)  6#   Elbow Flexion Limitations 6x10 reps   Elbow Extension Strengthening;Both   Elbow Extension Limitations Orange XTS 6x10 reps     Shoulder  Exercises: Prone   Retraction Strengthening;Right;Weights   Retraction Weight (lbs) 6#   Retraction Limitations 6 x10 reps   Extension Strengthening;Right;Weights  6x10 reps   Extension Weight (lbs) 6#     Shoulder Exercises: Sidelying   External Rotation Strengthening;Right;Weights   External Rotation Weight (lbs) 2   External Rotation Limitations 6x10 reps     Shoulder Exercises: Standing   Protraction Strengthening;Right;Theraband   Theraband Level (Shoulder Protraction) Level 3 (Green)   Protraction Limitations 6x10 reps   Flexion Strengthening;Both;Weights   Shoulder Flexion Weight (lbs) 5   Flexion Limitations 6x10 reps   Other Standing Exercises R shoulder D1 and D2 4# 2x10 reps; OH lift 4# x20 reps   Other Standing Exercises Standing B lat pulldown orange xts 3x10 reps     Shoulder Exercises: ROM/Strengthening   UBE (Upper Arm Bike) 90 RPM x8 min                  PT Short Term Goals - 03/11/16 1742      PT SHORT TERM GOAL #2   Title PROM of R shoulder to Protocol limits   Time  4   Period Weeks   Status Achieved           PT Long Term Goals - 02/24/16 1726      PT LONG TERM GOAL #1   Title R shoulder ROM WFL to perform ADLs   Time 8   Period Weeks   Status Achieved     PT LONG TERM GOAL #2   Title R shoulder strength 5/5 to allow RTW   Time 8   Period Weeks   Status Partially Met  R shoulder flex, IR MMT 5/5, ER 4+/5 02/24/2016     PT LONG TERM GOAL #3   Title Able to peform ADLS with pain 4/09  or less in R shoulder   Time 8   Period Weeks   Status Achieved               Plan - 05/04/16 1729    Clinical Impression Statement Patient arrived to clinic with low level R shoulder pain today. Patient able to complete all exercises even new exercises without complaint of pain only fatigue. Resistance increased with bicep curl with only complaint of fatigue by patient. Patient continues to have greatest difficulty and fatigue with SL  ER.   Rehab Potential Excellent   Clinical Impairments Affecting Rehab Potential R biceps tendon repair 2015 current surgery 10/14/15    PT Frequency 2x / week   PT Duration 8 weeks   PT Treatment/Interventions ADLs/Self Care Home Management;Electrical Stimulation;Cryotherapy;Ultrasound;Neuromuscular re-education;Therapeutic exercise;Manual techniques;Scar mobilization;Passive range of motion;Vasopneumatic Device;Patient/family education   PT Next Visit Plan Continue with R shoulder strengthening per MD discretion.      PT Home Exercise Plan supine ER/IR with cane, pendulums, elbow flex/ext; AAROM flexion, table slides, AAROM ER.     Consulted and Agree with Plan of Care Patient      Patient will benefit from skilled therapeutic intervention in order to improve the following deficits and impairments:  Decreased range of motion, Pain, Impaired UE functional use, Decreased strength  Visit Diagnosis: Stiffness of right shoulder, not elsewhere classified  Acute pain of right shoulder  Muscle weakness (generalized)     Problem List There are no active problems to display for this patient.   Wynelle Fanny, PTA 05/04/2016, 5:36 PM  [facility] [address] [place], [place], [address] Phone: [phone]   Fax:  [phone]  Name: Scott Chapman MRN: [id number] Date of Birth: Oct 25, 1960

## 2016-05-06 ENCOUNTER — Ambulatory Visit: Payer: Worker's Compensation | Admitting: Physical Therapy

## 2016-05-06 ENCOUNTER — Encounter: Payer: Self-pay | Admitting: Physical Therapy

## 2016-05-06 DIAGNOSIS — M25511 Pain in right shoulder: Secondary | ICD-10-CM

## 2016-05-06 DIAGNOSIS — M25611 Stiffness of right shoulder, not elsewhere classified: Secondary | ICD-10-CM | POA: Diagnosis not present

## 2016-05-06 DIAGNOSIS — M6281 Muscle weakness (generalized): Secondary | ICD-10-CM

## 2016-05-06 NOTE — Therapy (Signed)
[facility] [place], [place], [address] Phone: [phone]   Fax:  [phone]  Physical Therapy Treatment  Patient Details  Name: Scott Chapman MRN: [id number] Date of Birth: 16-Oct-1960 Referring Provider: Jerilynn Mages PA-C  Encounter Date: 05/06/2016      PT End of Session - 05/06/16 1738    Visit Number 51   Number of Visits 56   Date for PT Re-Evaluation 05/06/16   Authorization Type To MD 5 -18   PT Start Time 1646   PT Stop Time 1731   PT Time Calculation (min) 45 min   Activity Tolerance Patient tolerated treatment well   Behavior During Therapy [facility] for tasks assessed/performed      Past Medical History:  Diagnosis Date  . Allergy   . Hyperlipidemia   . Hypertension     Past Surgical History:  Procedure Laterality Date  . Colapsed lung Right   . HERNIA REPAIR    . left arm surgery    . TENDON REPAIR Right    arm    There were no vitals filed for this visit.      Subjective Assessment - 05/06/16 1737    Subjective Reports 2/10 R shoulder discomfort today but otherwise he feels alright.   Pertinent History HTN, asthma   Currently in Pain? Yes   Pain Score 2    Pain Location Shoulder   Pain Orientation Right   Pain Descriptors / Indicators Discomfort   Pain Type Surgical pain   Pain Onset More than a month ago            [facility] PT Assessment - 05/06/16 0001      Assessment   Medical Diagnosis s/p R RCR   Onset Date/Surgical Date 10/14/15   Hand Dominance Right   Next MD Visit 05/2016     Precautions   Precautions Shoulder   Type of Shoulder Precautions RCR see protocol                     [facility] Adult PT Treatment/Exercise - 05/06/16 0001      Elbow Exercises   Elbow Flexion Strengthening;Right   Bar Weights/Barbell (Elbow Flexion) Other (comment)  6#   Elbow Flexion Limitations 6x10 reps   Elbow Extension Strengthening;Both   Elbow Extension Limitations  Orange XTS 6x10 reps     Shoulder Exercises: Prone   Retraction Strengthening;Right;Weights   Retraction Weight (lbs) 6#   Retraction Limitations 6 x10 reps   Extension Strengthening;Right;Weights  6x10 reps   Extension Weight (lbs) 6#     Shoulder Exercises: Sidelying   External Rotation Strengthening;Right;Weights   External Rotation Weight (lbs) 2   External Rotation Limitations 6x10 reps     Shoulder Exercises: Standing   Protraction Strengthening;Right;Theraband   Theraband Level (Shoulder Protraction) Level 3 (Green)   Protraction Limitations 6x10 reps   Flexion Strengthening;Both;Weights   Shoulder Flexion Weight (lbs) 6   Flexion Limitations 6x10 reps   Row Strengthening;Both   Row Limitations 6x10 reps Orange XTS   Other Standing Exercises R shoulder D1 and D2 4# 3x10 reps; OH lift 4# x30 reps     Shoulder Exercises: ROM/Strengthening   UBE (Upper Arm Bike) 90 RPM x8 min   Wall Pushups Other (comment)  6x10 reps at file cabinet                  PT Short Term Goals - 03/11/16 1742      PT  SHORT TERM GOAL #2   Title PROM of R shoulder to Protocol limits   Time 4   Period Weeks   Status Achieved           PT Long Term Goals - 02/24/16 1726      PT LONG TERM GOAL #1   Title R shoulder ROM WFL to perform ADLs   Time 8   Period Weeks   Status Achieved     PT LONG TERM GOAL #2   Title R shoulder strength 5/5 to allow RTW   Time 8   Period Weeks   Status Partially Met  R shoulder flex, IR MMT 5/5, ER 4+/5 02/24/2016     PT LONG TERM GOAL #3   Title Able to peform ADLS with pain 3/74  or less in R shoulder   Time 8   Period Weeks   Status Achieved               Plan - 05/06/16 1741    Clinical Impression Statement Patient arrived to clinic and continues to do very well in clinic although he has low level R shoulder pain. Patient able to complete all hand weight exercises with 6# today with no complaints. Wall pushups moved to  incline pushup from file cabinet as to progress strengthening. Greatest difficulty remains in SL ER with resistance.   Rehab Potential Excellent   Clinical Impairments Affecting Rehab Potential R biceps tendon repair 2015 current surgery 10/14/15    PT Frequency 2x / week   PT Duration 8 weeks   PT Treatment/Interventions ADLs/Self Care Home Management;Electrical Stimulation;Cryotherapy;Ultrasound;Neuromuscular re-education;Therapeutic exercise;Manual techniques;Scar mobilization;Passive range of motion;Vasopneumatic Device;Patient/family education   PT Next Visit Plan Continue with R shoulder strengthening per MD discretion.      PT Home Exercise Plan supine ER/IR with cane, pendulums, elbow flex/ext; AAROM flexion, table slides, AAROM ER.     Consulted and Agree with Plan of Care Patient      Patient will benefit from skilled therapeutic intervention in order to improve the following deficits and impairments:  Decreased range of motion, Pain, Impaired UE functional use, Decreased strength  Visit Diagnosis: Stiffness of right shoulder, not elsewhere classified  Acute pain of right shoulder  Muscle weakness (generalized)     Problem List There are no active problems to display for this patient.   Wynelle Fanny, PTA 05/06/2016, 5:49 PM  [facility] [address] [place], [place], [address] Phone: [phone]   Fax:  [phone]  Name: Scott Chapman MRN: [id number] Date of Birth: 15-May-1960

## 2016-05-11 ENCOUNTER — Encounter: Payer: Self-pay | Admitting: Physical Therapy

## 2016-05-11 ENCOUNTER — Ambulatory Visit: Payer: Worker's Compensation | Attending: Orthopedic Surgery | Admitting: Physical Therapy

## 2016-05-11 DIAGNOSIS — M25511 Pain in right shoulder: Secondary | ICD-10-CM | POA: Insufficient documentation

## 2016-05-11 DIAGNOSIS — M6281 Muscle weakness (generalized): Secondary | ICD-10-CM | POA: Diagnosis present

## 2016-05-11 DIAGNOSIS — M25611 Stiffness of right shoulder, not elsewhere classified: Secondary | ICD-10-CM | POA: Diagnosis not present

## 2016-05-11 NOTE — Therapy (Signed)
[facility] [place], [place], [address] Phone: [phone]   Fax:  [phone]  Physical Therapy Treatment  Patient Details  Name: Scott Chapman MRN: [id number] Date of Birth: 1960-06-24 Referring Provider: Jerilynn Mages PA-C  Encounter Date: 05/11/2016      PT End of Session - 05/11/16 1641    Visit Number 52   Number of Visits 56   Date for PT Re-Evaluation 05/06/16   Authorization Type To MD 5 -18   PT Start Time 1640   PT Stop Time 1723   PT Time Calculation (min) 43 min   Activity Tolerance Patient tolerated treatment well   Behavior During Therapy [facility] for tasks assessed/performed      Past Medical History:  Diagnosis Date  . Allergy   . Hyperlipidemia   . Hypertension     Past Surgical History:  Procedure Laterality Date  . Colapsed lung Right   . HERNIA REPAIR    . left arm surgery    . TENDON REPAIR Right    arm    There were no vitals filed for this visit.      Subjective Assessment - 05/11/16 1641    Subjective Reports 2/10  R shoulder pain today but able to see improvements.   Pertinent History HTN, asthma   Currently in Pain? Yes   Pain Score 2    Pain Location Shoulder   Pain Orientation Right   Pain Descriptors / Indicators Discomfort   Pain Type Surgical pain   Pain Onset More than a month ago            [facility] PT Assessment - 05/11/16 0001      Assessment   Medical Diagnosis s/p R RCR   Onset Date/Surgical Date 10/14/15   Hand Dominance Right   Next MD Visit 05/19/2016     Precautions   Precautions Shoulder   Type of Shoulder Precautions RCR see protocol                     [facility] Adult PT Treatment/Exercise - 05/11/16 0001      Elbow Exercises   Elbow Flexion Strengthening;Right   Elbow Flexion Limitations 3x10 reps with Orange XTS   Elbow Extension Strengthening;Both   Elbow Extension Limitations Orange XTS 6x10 reps     Shoulder Exercises: Prone    Retraction Strengthening;Right;Weights   Retraction Weight (lbs) 6#   Retraction Limitations 6 x10 reps   Extension Strengthening;Right;Weights  6x10 reps   Extension Weight (lbs) 6#     Shoulder Exercises: Standing   Protraction Strengthening;Both   Protraction Limitations 3x10 reps with orange XTS   External Rotation Strengthening;Right;Weights  4x10 reps   External Rotation Weight (lbs) 3   Flexion Strengthening;Both;Weights   Shoulder Flexion Weight (lbs) 6   Flexion Limitations 6x10 reps   Other Standing Exercises R shoulder D1 and D2 4# 3x10 reps; OH lift 4# x30 reps   Other Standing Exercises Standing B lat pulldown orange xts 3x10 reps; B shoulder scaption 6# 1x10 reps     Shoulder Exercises: ROM/Strengthening   UBE (Upper Arm Bike) 90 RPM x8 min   Wall Wash CW and CCW x30 reps each   Wall Pushups Other (comment)  6x10 reps at file cabinet   Other ROM/Strengthening Exercises Wall walks green theraband x3 RT   Other ROM/Strengthening Exercises Vertical bodyblade x1 min, horizontal bodyblade x1 min  PT Short Term Goals - 03/11/16 1742      PT SHORT TERM GOAL #2   Title PROM of R shoulder to Protocol limits   Time 4   Period Weeks   Status Achieved           PT Long Term Goals - 02/24/16 1726      PT LONG TERM GOAL #1   Title R shoulder ROM WFL to perform ADLs   Time 8   Period Weeks   Status Achieved     PT LONG TERM GOAL #2   Title R shoulder strength 5/5 to allow RTW   Time 8   Period Weeks   Status Partially Met  R shoulder flex, IR MMT 5/5, ER 4+/5 02/24/2016     PT LONG TERM GOAL #3   Title Able to peform ADLS with pain 8/18  or less in R shoulder   Time 8   Period Weeks   Status Achieved               Plan - 05/11/16 1727    Clinical Impression Statement Patient arrived to treatment with 2/10 R shoulder pain today but able to notice improvements per patient report. Patient able to tolerate continued  strengthening as well as new strengthening exercises well although fatigue reported throughout treatment. Body blade also initated today with fatigue noted by patient.   Rehab Potential Excellent   Clinical Impairments Affecting Rehab Potential R biceps tendon repair 2015 current surgery 10/14/15    PT Frequency 2x / week   PT Duration 8 weeks   PT Treatment/Interventions ADLs/Self Care Home Management;Electrical Stimulation;Cryotherapy;Ultrasound;Neuromuscular re-education;Therapeutic exercise;Manual techniques;Scar mobilization;Passive range of motion;Vasopneumatic Device;Patient/family education   PT Next Visit Plan Continue with R shoulder strengthening per MD discretion.      PT Home Exercise Plan supine ER/IR with cane, pendulums, elbow flex/ext; AAROM flexion, table slides, AAROM ER.     Consulted and Agree with Plan of Care Patient      Patient will benefit from skilled therapeutic intervention in order to improve the following deficits and impairments:  Decreased range of motion, Pain, Impaired UE functional use, Decreased strength  Visit Diagnosis: Stiffness of right shoulder, not elsewhere classified  Acute pain of right shoulder  Muscle weakness (generalized)     Problem List There are no active problems to display for this patient.   Wynelle Fanny, PTA 05/11/2016, 5:36 PM  [facility] [address] [place], [place], [address] Phone: [phone]   Fax:  [phone]  Name: Scott Chapman MRN: [id number] Date of Birth: 01-13-1960

## 2016-05-13 ENCOUNTER — Encounter: Payer: Self-pay | Admitting: Physical Therapy

## 2016-05-13 ENCOUNTER — Ambulatory Visit: Payer: Worker's Compensation | Admitting: Physical Therapy

## 2016-05-13 DIAGNOSIS — M25611 Stiffness of right shoulder, not elsewhere classified: Secondary | ICD-10-CM | POA: Diagnosis not present

## 2016-05-13 DIAGNOSIS — M6281 Muscle weakness (generalized): Secondary | ICD-10-CM

## 2016-05-13 DIAGNOSIS — M25511 Pain in right shoulder: Secondary | ICD-10-CM

## 2016-05-13 NOTE — Therapy (Signed)
[facility] [place], [place], [address] Phone: [phone]   Fax:  [phone]  Physical Therapy Treatment  Patient Details  Name: Scott Chapman MRN: [id number] Date of Birth: 14-Jun-1960 Referring Provider: Jerilynn Mages PA-C  Encounter Date: 05/13/2016      PT End of Session - 05/13/16 1645    Visit Number 53   Number of Visits 56   Date for PT Re-Evaluation 05/06/16   PT Start Time 0233   PT Stop Time 1723   PT Time Calculation (min) 39 min   Activity Tolerance Patient tolerated treatment well   Behavior During Therapy [facility] for tasks assessed/performed      Past Medical History:  Diagnosis Date  . Allergy   . Hyperlipidemia   . Hypertension     Past Surgical History:  Procedure Laterality Date  . Colapsed lung Right   . HERNIA REPAIR    . left arm surgery    . TENDON REPAIR Right    arm    There were no vitals filed for this visit.      Subjective Assessment - 05/13/16 1645    Subjective Reports 2/10  R shoulder pain today.   Pertinent History HTN, asthma   Currently in Pain? Yes   Pain Score 2    Pain Location Shoulder   Pain Orientation Right   Pain Descriptors / Indicators Discomfort   Pain Type Surgical pain   Pain Onset More than a month ago                                   PT Short Term Goals - 05/13/16 1652      PT SHORT TERM GOAL #1   Title I with HEP   Time 4   Period Weeks   Status Achieved     PT SHORT TERM GOAL #2   Title PROM of R shoulder to Protocol limits   Time 4   Period Weeks   Status Achieved           PT Long Term Goals - 05/13/16 1742      PT LONG TERM GOAL #1   Title R shoulder ROM WFL to perform ADLs   Time 8   Period Weeks   Status Achieved     PT LONG TERM GOAL #2   Title R shoulder strength 5/5 to allow RTW   Time 8   Period Weeks   Status Partially Met  R shoulder 5/5 throughout except ER 4+/5 05/13/2016     PT  LONG TERM GOAL #3   Title Able to peform ADLS with pain 4/35  or less in R shoulder   Time 8   Period Weeks   Status Achieved               Plan - 05/13/16 1743    Clinical Impression Statement Patient continues to arrive with 2/10 R shoulder discomfort but able to complete exercises as directed with only fatigue reported by patient. Patient has minimal limitations with R shoulder endurance exercises as observed in clinic. Met all goals except for R shoulder MMT goal with R shoulder 5/5 throughout except for 4+/5.   Rehab Potential Excellent   Clinical Impairments Affecting Rehab Potential R biceps tendon repair 2015 current surgery 10/14/15    PT Frequency 2x / week   PT Duration 8 weeks   PT Treatment/Interventions ADLs/Self  Care Home Management;Electrical Stimulation;Cryotherapy;Ultrasound;Neuromuscular re-education;Therapeutic exercise;Manual techniques;Scar mobilization;Passive range of motion;Vasopneumatic Device;Patient/family education   PT Next Visit Plan Route to MD for patient's appointment   PT Home Exercise Plan supine ER/IR with cane, pendulums, elbow flex/ext; AAROM flexion, table slides, AAROM ER.     Consulted and Agree with Plan of Care Patient      Patient will benefit from skilled therapeutic intervention in order to improve the following deficits and impairments:  Decreased range of motion, Pain, Impaired UE functional use, Decreased strength  Visit Diagnosis: Stiffness of right shoulder, not elsewhere classified  Acute pain of right shoulder  Muscle weakness (generalized)     Problem List There are no active problems to display for this patient.  Ahmed Prima, PTA 05/14/16 11:56 AM Mali Applegate MPT [facility] [address] [place], [place], [address] Phone: [phone]   Fax:  [phone]  Name: Scott Chapman MRN: [id number] Date of Birth: Oct 30, 1960

## 2016-05-25 ENCOUNTER — Encounter: Payer: Self-pay | Admitting: *Deleted

## 2016-05-27 ENCOUNTER — Ambulatory Visit: Payer: Worker's Compensation | Admitting: *Deleted

## 2016-05-27 DIAGNOSIS — M25511 Pain in right shoulder: Secondary | ICD-10-CM

## 2016-05-27 DIAGNOSIS — M25611 Stiffness of right shoulder, not elsewhere classified: Secondary | ICD-10-CM | POA: Diagnosis not present

## 2016-05-27 DIAGNOSIS — M6281 Muscle weakness (generalized): Secondary | ICD-10-CM

## 2016-05-27 NOTE — Therapy (Signed)
[facility] [place], [place], [address] Phone: [phone]   Fax:  [phone]  Physical Therapy Treatment  Patient Details  Name: Scott Chapman MRN: [id number] Date of Birth: [birth year] Referring Provider: Jerilynn Mages PA-C  Encounter Date: 05/27/2016      PT End of Session - 05/27/16 1706    Visit Number 75   Number of Visits 62   Date for PT Re-Evaluation 05/06/16   Authorization Type To MD 5 -18   PT Start Time 1645   PT Stop Time 1736   PT Time Calculation (min) 51 min      Past Medical History:  Diagnosis Date  . Allergy   . Hyperlipidemia   . Hypertension     Past Surgical History:  Procedure Laterality Date  . Colapsed lung Right   . HERNIA REPAIR    . left arm surgery    . TENDON REPAIR Right    arm    There were no vitals filed for this visit.      Subjective Assessment - 05/27/16 1703    Subjective Reports 2/10  R shoulder pain today. N.O to cont. with PT for strengthening of RT UE until MD  F/U 06-30-16   Pertinent History HTN, asthma   Currently in Pain? Yes   Pain Score 2    Pain Location Shoulder   Pain Orientation Right   Pain Type Surgical pain                         [facility] Adult PT Treatment/Exercise - 05/27/16 0001      Elbow Exercises   Elbow Flexion Strengthening;Right  7# 6x 10   Elbow Extension Strengthening;Both   Elbow Extension Limitations Orange XTS 6x 10     Shoulder Exercises: Prone   Retraction Strengthening;Right;Weights   Retraction Weight (lbs) 7#   Retraction Limitations 6 x10 reps   Extension Strengthening;Right;Weights  6x10 reps   Extension Weight (lbs) 7#     Shoulder Exercises: Standing   Flexion Strengthening;Both;Weights   Shoulder Flexion Weight (lbs) 6   Flexion Limitations 6x10 reps   Extension Both;Strengthening;10 reps;20 reps  XTS Pink 3 x 20   Row Strengthening;Both  Rows 3 plates 3x20   Other Standing Exercises R  shoulder D1 and D2 4# 3x10 reps; OH lift 4# x30 reps     Shoulder Exercises: ROM/Strengthening   UBE (Upper Arm Bike) 90 RPM x8 min   Wall Pushups Other (comment)  at counter 6x10 reps                  PT Short Term Goals - 05/13/16 1652      PT SHORT TERM GOAL #1   Title I with HEP   Time 4   Period Weeks   Status Achieved     PT SHORT TERM GOAL #2   Title PROM of R shoulder to Protocol limits   Time 4   Period Weeks   Status Achieved           PT Long Term Goals - 05/13/16 1742      PT LONG TERM GOAL #1   Title R shoulder ROM WFL to perform ADLs   Time 8   Period Weeks   Status Achieved     PT LONG TERM GOAL #2   Title R shoulder strength 5/5 to allow RTW   Time 8   Period Weeks   Status Partially  Met  R shoulder 5/5 throughout except ER 4+/5 05/13/2016     PT LONG TERM GOAL #3   Title Able to peform ADLS with pain 2/10  or less in R shoulder   Time 8   Period Weeks   Status Achieved               Plan - 05/27/16 1707    Clinical Impression Statement Pt arrived today after MD F/U with N.O. to continue PT for strengthening RT shldr. He was able to complete all strengthening Exs and act.'s with mainly fatigue. He was able to progress resistance in some of the exs and did well++++++++   Clinical Impairments Affecting Rehab Potential R biceps tendon repair 2015 current surgery 10/14/15    PT Frequency 2x / week   PT Duration 8 weeks   PT Treatment/Interventions ADLs/Self Care Home Management;Electrical Stimulation;Cryotherapy;Ultrasound;Neuromuscular re-education;Therapeutic exercise;Manual techniques;Scar mobilization;Passive range of motion;Vasopneumatic Device;Patient/family education   PT Next Visit Plan Route to MD for patient's appointment   PT Home Exercise Plan supine ER/IR with cane, pendulums, elbow flex/ext; AAROM flexion, table slides, AAROM ER.     Consulted and Agree with Plan of Care Patient      Patient will benefit from  skilled therapeutic intervention in order to improve the following deficits and impairments:  Decreased range of motion, Pain, Impaired UE functional use, Decreased strength  Visit Diagnosis: Stiffness of right shoulder, not elsewhere classified  Acute pain of right shoulder  Muscle weakness (generalized)     Problem List There are no active problems to display for this patient.   Nadya Hopwood,CHRIS, PTA 05/27/2016, 5:40 PM  [facility] [place], [place], [address] Phone: [phone]   Fax:  [phone]  Name: Scott Chapman MRN: [id number] Date of Birth: 10-20-1960

## 2016-06-01 ENCOUNTER — Ambulatory Visit: Payer: Worker's Compensation | Admitting: Physical Therapy

## 2016-06-01 ENCOUNTER — Encounter: Payer: Self-pay | Admitting: Physical Therapy

## 2016-06-01 DIAGNOSIS — M25611 Stiffness of right shoulder, not elsewhere classified: Secondary | ICD-10-CM

## 2016-06-01 DIAGNOSIS — M6281 Muscle weakness (generalized): Secondary | ICD-10-CM

## 2016-06-01 DIAGNOSIS — M25511 Pain in right shoulder: Secondary | ICD-10-CM

## 2016-06-01 NOTE — Therapy (Signed)
[facility] [place], [place], [address] Phone: [phone]   Fax:  [phone]  Physical Therapy Treatment  Patient Details  Name: Scott Chapman MRN: [id number] Date of Birth: 05-08-1960 Referring Provider: Jerilynn Mages PA-C  Encounter Date: 06/01/2016      PT End of Session - 06/01/16 1645    Visit Number 55   Number of Visits 36   Date for PT Re-Evaluation 05/06/16   PT Start Time 1636   PT Stop Time 1716   PT Time Calculation (min) 40 min   Activity Tolerance Patient tolerated treatment well   Behavior During Therapy [facility] for tasks assessed/performed      Past Medical History:  Diagnosis Date  . Allergy   . Hyperlipidemia   . Hypertension     Past Surgical History:  Procedure Laterality Date  . Colapsed lung Right   . HERNIA REPAIR    . left arm surgery    . TENDON REPAIR Right    arm    There were no vitals filed for this visit.      Subjective Assessment - 06/01/16 1641    Subjective Reports that he is still sore from previous treatment due to increased weights.   Pertinent History HTN, asthma   Currently in Pain? Yes   Pain Score 2    Pain Location Shoulder   Pain Orientation Right   Pain Descriptors / Indicators Sore   Pain Type Surgical pain   Pain Onset More than a month ago   Pain Frequency Intermittent            [facility] PT Assessment - 06/01/16 0001      Assessment   Medical Diagnosis s/p R RCR   Onset Date/Surgical Date 10/14/15   Hand Dominance Right   Next MD Visit 06/30/2016     Precautions   Precautions Shoulder   Type of Shoulder Precautions RCR see protocol                     [facility] Adult PT Treatment/Exercise - 06/01/16 0001      Elbow Exercises   Elbow Flexion Strengthening;Both;Other reps (comment);Standing;Bar weights/barbell   Bar Weights/Barbell (Elbow Flexion) Other (comment)  7#   Elbow Extension Strengthening;Both   Elbow Extension  Limitations Orange XTS 6x10 reps     Shoulder Exercises: Prone   Retraction Strengthening;Right;Weights   Retraction Weight (lbs) 7#   Retraction Limitations 6 x10 reps   Extension Strengthening;Right;Weights  6x10 reps   Extension Weight (lbs) 7#   Horizontal ABduction 1 Strengthening;Right;Weights   Horizontal ABduction 1 Weight (lbs) 6   Horizontal ABduction 1 Limitations 6x10 reps     Shoulder Exercises: Standing   Flexion Strengthening;Both;Weights   Shoulder Flexion Weight (lbs) 7   Flexion Limitations 6x10 reps   Other Standing Exercises R shoulder D1 and D2 4# 4x10 reps   Other Standing Exercises Standing B lat pulldown orange xts 6x10 reps     Shoulder Exercises: ROM/Strengthening   UBE (Upper Arm Bike) 60 RPM x6 min   Wall Pushups Other (comment)  3x20 reps at countertop                  PT Short Term Goals - 05/13/16 1652      PT SHORT TERM GOAL #1   Title I with HEP   Time 4   Period Weeks   Status Achieved     PT SHORT TERM GOAL #2  Title PROM of R shoulder to Protocol limits   Time 4   Period Weeks   Status Achieved           PT Long Term Goals - 05/13/16 1742      PT LONG TERM GOAL #1   Title R shoulder ROM WFL to perform ADLs   Time 8   Period Weeks   Status Achieved     PT LONG TERM GOAL #2   Title R shoulder strength 5/5 to allow RTW   Time 8   Period Weeks   Status Partially Met  R shoulder 5/5 throughout except ER 4+/5 05/13/2016     PT LONG TERM GOAL #3   Title Able to peform ADLS with pain 4/23  or less in R shoulder   Time 8   Period Weeks   Status Achieved               Plan - 06/01/16 1718    Clinical Impression Statement Patient tolerated today's treatment well although he arrived with shoulder soreness from increased resistance last treatment. Patient fatigued throughout treatment with increased resistance and with high number reps. Horizontal abduction weakness noted as patient had to be reduced to 6#.  Patient reported increased soreness upon end of treatment.   Rehab Potential Excellent   Clinical Impairments Affecting Rehab Potential R biceps tendon repair 2015 current surgery 10/14/15    PT Frequency 2x / week   PT Duration 8 weeks   PT Treatment/Interventions ADLs/Self Care Home Management;Electrical Stimulation;Cryotherapy;Ultrasound;Neuromuscular re-education;Therapeutic exercise;Manual techniques;Scar mobilization;Passive range of motion;Vasopneumatic Device;Patient/family education   PT Next Visit Plan Continue with shoulder strengthening and endurance per MPT POC.   PT Home Exercise Plan supine ER/IR with cane, pendulums, elbow flex/ext; AAROM flexion, table slides, AAROM ER.     Consulted and Agree with Plan of Care Patient      Patient will benefit from skilled therapeutic intervention in order to improve the following deficits and impairments:  Decreased range of motion, Pain, Impaired UE functional use, Decreased strength  Visit Diagnosis: Stiffness of right shoulder, not elsewhere classified  Acute pain of right shoulder  Muscle weakness (generalized)     Problem List There are no active problems to display for this patient.   Scott Chapman, PTA 06/01/2016, 5:24 PM  [facility] [address] [place], [place], [address] Phone: [phone]   Fax:  [phone]  Name: Scott Chapman MRN: [id number] Date of Birth: 01/09/1961

## 2016-06-03 ENCOUNTER — Encounter: Payer: Self-pay | Admitting: Physical Therapy

## 2016-06-03 ENCOUNTER — Ambulatory Visit: Payer: Worker's Compensation | Admitting: Physical Therapy

## 2016-06-03 DIAGNOSIS — M6281 Muscle weakness (generalized): Secondary | ICD-10-CM

## 2016-06-03 DIAGNOSIS — M25511 Pain in right shoulder: Secondary | ICD-10-CM

## 2016-06-03 DIAGNOSIS — M25611 Stiffness of right shoulder, not elsewhere classified: Secondary | ICD-10-CM

## 2016-06-03 NOTE — Addendum Note (Signed)
Addended by: Akshara Blumenthal, [place] W on: 06/03/2016 06:14 PM   Modules accepted: Orders

## 2016-06-03 NOTE — Therapy (Signed)
[facility] , [place], [address] Phone: [phone]   Fax:  [phone]  Physical Therapy Treatment  Patient Details  Name: Scott Chapman MRN: [id number] Date of Birth: [birth year] Referring Provider: Jerilynn Mages PA-C  Encounter Date: 06/03/2016      PT End of Session - 06/03/16 1658    Visit Number 56   Number of Visits 19   Date for PT Re-Evaluation 05/06/16   PT Start Time 1648   PT Stop Time 1736   PT Time Calculation (min) 48 min   Activity Tolerance Patient tolerated treatment well   Behavior During Therapy [facility] for tasks assessed/performed      Past Medical History:  Diagnosis Date  . Allergy   . Hyperlipidemia   . Hypertension     Past Surgical History:  Procedure Laterality Date  . Colapsed lung Right   . HERNIA REPAIR    . left arm surgery    . TENDON REPAIR Right    arm    There were no vitals filed for this visit.      Subjective Assessment - 06/03/16 1658    Subjective Reports that at times he has a posterior R shoulder catching sensation when he reaches back with ER intermittantly.   Pertinent History HTN, asthma   Currently in Pain? Yes   Pain Score 2    Pain Location Shoulder   Pain Orientation Right   Pain Descriptors / Indicators Discomfort   Pain Type Surgical pain   Pain Onset More than a month ago   Pain Frequency Intermittent            [facility] PT Assessment - 06/03/16 0001      Assessment   Medical Diagnosis s/p R RCR   Onset Date/Surgical Date 10/14/15   Hand Dominance Right   Next MD Visit 06/30/2016     Precautions   Precautions Shoulder   Type of Shoulder Precautions RCR see protocol                     [facility] Adult PT Treatment/Exercise - 06/03/16 0001      Elbow Exercises   Elbow Flexion Strengthening;Both;Other reps (comment);Standing;Bar weights/barbell   Bar Weights/Barbell (Elbow Flexion) Other (comment)  7# 6x10 reps   Elbow  Extension Strengthening;Both   Elbow Extension Limitations Orange XTS 6x10 reps     Shoulder Exercises: Prone   Retraction Strengthening;Right;Weights   Retraction Weight (lbs) 7#   Retraction Limitations 6 x10 reps   Extension Strengthening;Right;Weights  6x10 reps   Extension Weight (lbs) 7#     Shoulder Exercises: Standing   Flexion Strengthening;Both;Weights   Shoulder Flexion Weight (lbs) 7   Flexion Limitations 6x10 reps   Other Standing Exercises R shoulder D1 and D2 4# 4x10 reps   Other Standing Exercises Standing B lat pulldown orange xts 6x10 reps     Shoulder Exercises: ROM/Strengthening   UBE (Upper Arm Bike) 90 RPM x8 min   Wall Pushups Other (comment)  6x10 reps at countertop   Other ROM/Strengthening Exercises Row machine 30# 3x10 reps, 50# 3x10 reps   Other ROM/Strengthening Exercises RUE chop with 50# 6x10 reps     Shoulder Exercises: Body Blade   Flexion 60 seconds;3 reps   Other Body Blade Exercises Horziontal body blade 2x 60 sech                  PT Short Term Goals - 05/13/16 1652  PT SHORT TERM GOAL #1   Title I with HEP   Time 4   Period Weeks   Status Achieved     PT SHORT TERM GOAL #2   Title PROM of R shoulder to Protocol limits   Time 4   Period Weeks   Status Achieved           PT Long Term Goals - 05/13/16 1742      PT LONG TERM GOAL #1   Title R shoulder ROM WFL to perform ADLs   Time 8   Period Weeks   Status Achieved     PT LONG TERM GOAL #2   Title R shoulder strength 5/5 to allow RTW   Time 8   Period Weeks   Status Partially Met  R shoulder 5/5 throughout except ER 4+/5 05/13/2016     PT LONG TERM GOAL #3   Title Able to peform ADLS with pain 1/58  or less in R shoulder   Time 8   Period Weeks   Status Achieved               Plan - 06/03/16 1751    Clinical Impression Statement Patient continues to do well during treatment although fatigue noted throughout treatment especially with  bodyblade. Machine shoulder strengthening completed today with no complaints of discomfort per patient. Patient unable to replicate catching sensation causing position in clinic today.   Rehab Potential Excellent   Clinical Impairments Affecting Rehab Potential R biceps tendon repair 2015 current surgery 10/14/15    PT Frequency 2x / week   PT Duration 8 weeks   PT Treatment/Interventions ADLs/Self Care Home Management;Electrical Stimulation;Cryotherapy;Ultrasound;Neuromuscular re-education;Therapeutic exercise;Manual techniques;Scar mobilization;Passive range of motion;Vasopneumatic Device;Patient/family education   PT Next Visit Plan Continue with shoulder strengthening and endurance per MPT POC.   PT Home Exercise Plan supine ER/IR with cane, pendulums, elbow flex/ext; AAROM flexion, table slides, AAROM ER.     Consulted and Agree with Plan of Care Patient      Patient will benefit from skilled therapeutic intervention in order to improve the following deficits and impairments:  Decreased range of motion, Pain, Impaired UE functional use, Decreased strength  Visit Diagnosis: Stiffness of right shoulder, not elsewhere classified  Acute pain of right shoulder  Muscle weakness (generalized)     Problem List There are no active problems to display for this patient.   Wynelle Fanny, PTA 06/03/2016, 5:57 PM  [facility] [address] [place], [place], [address] Phone: [phone]   Fax:  [phone]  Name: Scott Chapman MRN: [id number] Date of Birth: November 21, 1960

## 2016-06-08 ENCOUNTER — Encounter: Payer: Self-pay | Admitting: Physical Therapy

## 2016-06-10 ENCOUNTER — Encounter: Payer: Self-pay | Admitting: Physical Therapy

## 2016-06-10 ENCOUNTER — Ambulatory Visit: Payer: Worker's Compensation | Admitting: Physical Therapy

## 2016-06-10 DIAGNOSIS — M6281 Muscle weakness (generalized): Secondary | ICD-10-CM

## 2016-06-10 DIAGNOSIS — M25611 Stiffness of right shoulder, not elsewhere classified: Secondary | ICD-10-CM | POA: Diagnosis not present

## 2016-06-10 DIAGNOSIS — M25511 Pain in right shoulder: Secondary | ICD-10-CM

## 2016-06-10 NOTE — Therapy (Signed)
[facility] [place], [place], [address] Phone: [phone]   Fax:  [phone]  Physical Therapy Treatment  Patient Details  Name: Scott Chapman MRN: [id number] Date of Birth: 1960/12/27 Referring Provider: Jerilynn Mages PA-C  Encounter Date: 06/10/2016      PT End of Session - 06/10/16 1656    Visit Number 30   Number of Visits 62   Date for PT Re-Evaluation 07/01/16   PT Start Time 1645   PT Stop Time 1731   PT Time Calculation (min) 46 min   Activity Tolerance Patient tolerated treatment well   Behavior During Therapy [facility] for tasks assessed/performed      Past Medical History:  Diagnosis Date  . Allergy   . Hyperlipidemia   . Hypertension     Past Surgical History:  Procedure Laterality Date  . Colapsed lung Right   . HERNIA REPAIR    . left arm surgery    . TENDON REPAIR Right    arm    There were no vitals filed for this visit.      Subjective Assessment - 06/10/16 1655    Subjective No new complaints from patient.   Pertinent History HTN, asthma   Currently in Pain? Yes   Pain Score 2    Pain Location Shoulder   Pain Orientation Right   Pain Descriptors / Indicators Discomfort   Pain Type Surgical pain   Pain Onset More than a month ago            [facility] PT Assessment - 06/10/16 0001      Assessment   Medical Diagnosis s/p R RCR   Onset Date/Surgical Date 10/14/15   Hand Dominance Right   Next MD Visit 06/30/2016     Precautions   Precautions Shoulder   Type of Shoulder Precautions RCR see protocol                     [facility] Adult PT Treatment/Exercise - 06/10/16 0001      Elbow Exercises   Elbow Flexion Strengthening;Both;Other reps (comment);Standing;Bar weights/barbell   Bar Weights/Barbell (Elbow Flexion) Other (comment)  7# 6x10 reps   Elbow Extension Strengthening;Both   Elbow Extension Limitations Orange XTS 6x10 reps     Shoulder Exercises: Prone   Retraction Strengthening;Right;Weights   Retraction Weight (lbs) 7#   Retraction Limitations 6 x10 reps   Extension Strengthening;Right;Weights  7#   Extension Weight (lbs) 7#   Horizontal ABduction 1 Strengthening;Right;Weights   Horizontal ABduction 1 Weight (lbs) 6   Horizontal ABduction 1 Limitations 6x10 reps     Shoulder Exercises: Sidelying   External Rotation Strengthening;Right;Weights   External Rotation Weight (lbs) 3   External Rotation Limitations 6x10 reps     Shoulder Exercises: Standing   Flexion Strengthening;Both;Weights   Shoulder Flexion Weight (lbs) 7   Flexion Limitations 6x10 reps   Row Strengthening;Both;Weights   Row Weight (lbs) 50#   Row Limitations 6x10 reps   Other Standing Exercises R shoulder D1 and D2, OH lift 4# 5x10 reps   Other Standing Exercises Standing B lat pulldown, punches orange xts 6x10 reps     Shoulder Exercises: ROM/Strengthening   UBE (Upper Arm Bike) 90 RPM x6 min   Wall Pushups Other (comment)  6x10 reps                  PT Short Term Goals - 05/13/16 1652      PT SHORT TERM GOAL #  1   Title I with HEP   Time 4   Period Weeks   Status Achieved     PT SHORT TERM GOAL #2   Title PROM of R shoulder to Protocol limits   Time 4   Period Weeks   Status Achieved           PT Long Term Goals - 05/13/16 1742      PT LONG TERM GOAL #1   Title R shoulder ROM WFL to perform ADLs   Time 8   Period Weeks   Status Achieved     PT LONG TERM GOAL #2   Title R shoulder strength 5/5 to allow RTW   Time 8   Period Weeks   Status Partially Met  R shoulder 5/5 throughout except ER 4+/5 05/13/2016     PT LONG TERM GOAL #3   Title Able to peform ADLS with pain 6/43  or less in R shoulder   Time 8   Period Weeks   Status Achieved               Plan - 06/10/16 1746    Clinical Impression Statement Patient did well in today's treatment although he initially presented with fatigue which progressed throughout  treatment. No complaints during machine shoulder strengthening from patient today. Facial grimacing observed with SL R shoulder ER again today.   Rehab Potential Excellent   Clinical Impairments Affecting Rehab Potential R biceps tendon repair 2015 current surgery 10/14/15    PT Frequency 2x / week   PT Duration 8 weeks   PT Treatment/Interventions ADLs/Self Care Home Management;Electrical Stimulation;Cryotherapy;Ultrasound;Neuromuscular re-education;Therapeutic exercise;Manual techniques;Scar mobilization;Passive range of motion;Vasopneumatic Device;Patient/family education   PT Next Visit Plan Continue with shoulder strengthening and endurance per MPT POC.   PT Home Exercise Plan supine ER/IR with cane, pendulums, elbow flex/ext; AAROM flexion, table slides, AAROM ER.     Consulted and Agree with Plan of Care Patient      Patient will benefit from skilled therapeutic intervention in order to improve the following deficits and impairments:  Decreased range of motion, Pain, Impaired UE functional use, Decreased strength  Visit Diagnosis: Stiffness of right shoulder, not elsewhere classified  Acute pain of right shoulder  Muscle weakness (generalized)     Problem List There are no active problems to display for this patient.   Wynelle Fanny, PTA  06/10/2016, 5:50 PM  [facility] [address] [place], [place], [address] Phone: [phone]   Fax:  [phone]  Name: Scott Chapman MRN: [id number] Date of Birth: 1960/03/13

## 2016-06-15 ENCOUNTER — Ambulatory Visit: Payer: Worker's Compensation | Attending: Orthopedic Surgery | Admitting: Physical Therapy

## 2016-06-15 ENCOUNTER — Encounter: Payer: Self-pay | Admitting: Physical Therapy

## 2016-06-15 DIAGNOSIS — M25611 Stiffness of right shoulder, not elsewhere classified: Secondary | ICD-10-CM

## 2016-06-15 DIAGNOSIS — M6281 Muscle weakness (generalized): Secondary | ICD-10-CM | POA: Diagnosis present

## 2016-06-15 DIAGNOSIS — M25511 Pain in right shoulder: Secondary | ICD-10-CM | POA: Diagnosis present

## 2016-06-15 NOTE — Therapy (Signed)
[facility] [place], [place], [address] Phone: [phone]   Fax:  [phone]  Physical Therapy Treatment  Patient Details  Name: Scott Chapman MRN: [id number] Date of Birth: 1960/12/06 Referring Provider: Jerilynn Mages PA-C  Encounter Date: 06/15/2016      PT End of Session - 06/15/16 1648    Visit Number 73   Number of Visits 62   Date for PT Re-Evaluation 07/01/16   PT Start Time 1645   PT Stop Time 1728   PT Time Calculation (min) 43 min   Activity Tolerance Patient tolerated treatment well   Behavior During Therapy [facility] for tasks assessed/performed      Past Medical History:  Diagnosis Date  . Allergy   . Hyperlipidemia   . Hypertension     Past Surgical History:  Procedure Laterality Date  . Colapsed lung Right   . HERNIA REPAIR    . left arm surgery    . TENDON REPAIR Right    arm    There were no vitals filed for this visit.      Subjective Assessment - 06/15/16 1647    Subjective No new complaints from patient.   Pertinent History HTN, asthma   Currently in Pain? Yes   Pain Score 2    Pain Location Shoulder   Pain Orientation Right   Pain Descriptors / Indicators Discomfort   Pain Type Surgical pain   Pain Onset More than a month ago            [facility] PT Assessment - 06/15/16 0001      Assessment   Medical Diagnosis s/p R RCR   Onset Date/Surgical Date 10/14/15   Hand Dominance Right   Next MD Visit 06/30/2016     Precautions   Precautions Shoulder   Type of Shoulder Precautions RCR see protocol                     [facility] Adult PT Treatment/Exercise - 06/15/16 0001      Elbow Exercises   Elbow Flexion Strengthening;Both;Other reps (comment);Standing;Bar weights/barbell   Bar Weights/Barbell (Elbow Flexion) Other (comment)  7#   Elbow Flexion Limitations 6x10 reps   Elbow Extension Strengthening;Both   Elbow Extension Limitations orange XTS 6x10 reps     Shoulder Exercises: Prone   Retraction Strengthening;Right;Weights   Retraction Weight (lbs) 7#   Retraction Limitations 6 x10 reps   Extension Strengthening;Right;Weights  7#   Extension Weight (lbs) 7#     Shoulder Exercises: Standing   Flexion Strengthening;Both;Weights   Shoulder Flexion Weight (lbs) 7   Flexion Limitations 6x10 reps   ABduction Strengthening;Right;Weights   Shoulder ABduction Weight (lbs) 7   ABduction Limitations 6x10 reps   Other Standing Exercises R shoulder D1 and D2, OH lift 4# 6x10 reps   Other Standing Exercises Standing B lat pulldown, punches orange xts 6x10 reps     Shoulder Exercises: ROM/Strengthening   UBE (Upper Arm Bike) 90 RPM x6 min   Wall Pushups Other (comment)  6x10 reps                  PT Short Term Goals - 05/13/16 1652      PT SHORT TERM GOAL #1   Title I with HEP   Time 4   Period Weeks   Status Achieved     PT SHORT TERM GOAL #2   Title PROM of R shoulder to Protocol limits   Time 4  Period Weeks   Status Achieved           PT Long Term Goals - 05/13/16 1742      PT LONG TERM GOAL #1   Title R shoulder ROM WFL to perform ADLs   Time 8   Period Weeks   Status Achieved     PT LONG TERM GOAL #2   Title R shoulder strength 5/5 to allow RTW   Time 8   Period Weeks   Status Partially Met  R shoulder 5/5 throughout except ER 4+/5 05/13/2016     PT LONG TERM GOAL #3   Title Able to peform ADLS with pain 1/93  or less in R shoulder   Time 8   Period Weeks   Status Achieved               Plan - 06/15/16 1732    Clinical Impression Statement Patient continues to tolerate treatment well although fatigue reported throughout treatment. Patient observed with overall decreased struggle with exercises today even with 7# or 6#. No facial grimacing noted during treatment at any time. Focus of treatment is to strength RUE to achieve goal for strengthening.   Rehab Potential Excellent   Clinical  Impairments Affecting Rehab Potential R biceps tendon repair 2015 current surgery 10/14/15    PT Frequency 2x / week   PT Duration 8 weeks   PT Treatment/Interventions ADLs/Self Care Home Management;Electrical Stimulation;Cryotherapy;Ultrasound;Neuromuscular re-education;Therapeutic exercise;Manual techniques;Scar mobilization;Passive range of motion;Vasopneumatic Device;Patient/family education   PT Next Visit Plan Continue with shoulder strengthening and endurance per MPT POC.   PT Home Exercise Plan supine ER/IR with cane, pendulums, elbow flex/ext; AAROM flexion, table slides, AAROM ER.     Consulted and Agree with Plan of Care Patient      Patient will benefit from skilled therapeutic intervention in order to improve the following deficits and impairments:  Decreased range of motion, Pain, Impaired UE functional use, Decreased strength  Visit Diagnosis: Stiffness of right shoulder, not elsewhere classified  Acute pain of right shoulder  Muscle weakness (generalized)     Problem List There are no active problems to display for this patient.   Wynelle Fanny, PTA 06/15/2016, 5:37 PM  [facility] [address] [place], [place], [address] Phone: [phone]   Fax:  [phone]  Name: Scott Chapman MRN: [id number] Date of Birth: [birth year]

## 2016-06-17 ENCOUNTER — Ambulatory Visit: Payer: Worker's Compensation | Admitting: Physical Therapy

## 2016-06-17 ENCOUNTER — Encounter: Payer: Self-pay | Admitting: Physical Therapy

## 2016-06-17 DIAGNOSIS — M25511 Pain in right shoulder: Secondary | ICD-10-CM

## 2016-06-17 DIAGNOSIS — M6281 Muscle weakness (generalized): Secondary | ICD-10-CM

## 2016-06-17 DIAGNOSIS — M25611 Stiffness of right shoulder, not elsewhere classified: Secondary | ICD-10-CM

## 2016-06-17 NOTE — Therapy (Signed)
[facility] [place], [place], [address] Phone: [phone]   Fax:  [phone]  Physical Therapy Treatment  Patient Details  Name: Scott Chapman MRN: [id number] Date of Birth: [birth year] Referring Provider: Jerilynn Mages PA-C  Encounter Date: 06/17/2016      PT End of Session - 06/17/16 1643    Visit Number 29   Number of Visits 62   Date for PT Re-Evaluation 07/01/16   PT Start Time 1645   PT Stop Time 1731   PT Time Calculation (min) 46 min   Activity Tolerance Patient tolerated treatment well   Behavior During Therapy [facility] for tasks assessed/performed      Past Medical History:  Diagnosis Date  . Allergy   . Hyperlipidemia   . Hypertension     Past Surgical History:  Procedure Laterality Date  . Colapsed lung Right   . HERNIA REPAIR    . left arm surgery    . TENDON REPAIR Right    arm    There were no vitals filed for this visit.      Subjective Assessment - 06/17/16 1643    Subjective No new complaints from patient.   Pertinent History HTN, asthma   Currently in Pain? Yes   Pain Score 2    Pain Location Shoulder   Pain Orientation Right   Pain Descriptors / Indicators Discomfort   Pain Type Surgical pain   Pain Onset More than a month ago            [facility] PT Assessment - 06/17/16 0001      Assessment   Medical Diagnosis s/p R RCR   Onset Date/Surgical Date 10/14/15   Hand Dominance Right   Next MD Visit 06/30/2016     Precautions   Precautions Shoulder   Type of Shoulder Precautions RCR see protocol                     [facility] Adult PT Treatment/Exercise - 06/17/16 0001      Elbow Exercises   Elbow Flexion Strengthening;Both;Other reps (comment);Standing;Bar weights/barbell   Bar Weights/Barbell (Elbow Flexion) Other (comment)  7#   Elbow Flexion Limitations 6x10 reps   Elbow Extension Strengthening;Both   Elbow Extension Limitations Orange XTS 6x10 reps     Shoulder Exercises: Prone   Retraction Strengthening;Right;Weights   Retraction Weight (lbs) 7#   Retraction Limitations 6 x10 reps   Extension Strengthening;Right;Weights  6x10 reps   Extension Weight (lbs) 7#     Shoulder Exercises: Sidelying   External Rotation Strengthening;Right;Weights   External Rotation Weight (lbs) 3   External Rotation Limitations 4x10 reps     Shoulder Exercises: Standing   Flexion Strengthening;Both;Weights   Shoulder Flexion Weight (lbs) 7   Flexion Limitations 6x10 reps   ABduction Strengthening;Right;Weights   Shoulder ABduction Weight (lbs) 7   ABduction Limitations 6x10 reps   Other Standing Exercises R shoulder D1 and D2 4# 6x10 reps   Other Standing Exercises Standing B lat pulldown, punches orange xts 6x10 reps     Shoulder Exercises: ROM/Strengthening   UBE (Upper Arm Bike) 90 RPM x8 min   Wall Pushups Other (comment)  6x10 reps at countertop   Other ROM/Strengthening Exercises --                  PT Short Term Goals - 05/13/16 1652      PT SHORT TERM GOAL #1   Title I with HEP  Time 4   Period Weeks   Status Achieved     PT SHORT TERM GOAL #2   Title PROM of R shoulder to Protocol limits   Time 4   Period Weeks   Status Achieved           PT Long Term Goals - 05/13/16 1742      PT LONG TERM GOAL #1   Title R shoulder ROM WFL to perform ADLs   Time 8   Period Weeks   Status Achieved     PT LONG TERM GOAL #2   Title R shoulder strength 5/5 to allow RTW   Time 8   Period Weeks   Status Partially Met  R shoulder 5/5 throughout except ER 4+/5 05/13/2016     PT LONG TERM GOAL #3   Title Able to peform ADLS with pain 5/05  or less in R shoulder   Time 8   Period Weeks   Status Achieved               Plan - 06/17/16 1731    Clinical Impression Statement Patient continues to tolerate treatment well as only fatigue reported by patient during today's treatment. No abnormal shoulder compensation noted  with any shoulder exercises. Facial grimacing as well as weakness observed with SL R shoulder with 3# weight today.   Rehab Potential Excellent   Clinical Impairments Affecting Rehab Potential R biceps tendon repair 2015 current surgery 10/14/15    PT Frequency 2x / week   PT Duration 8 weeks   PT Treatment/Interventions ADLs/Self Care Home Management;Electrical Stimulation;Cryotherapy;Ultrasound;Neuromuscular re-education;Therapeutic exercise;Manual techniques;Scar mobilization;Passive range of motion;Vasopneumatic Device;Patient/family education   PT Next Visit Plan Continue with shoulder strengthening and endurance per MPT POC.   PT Home Exercise Plan supine ER/IR with cane, pendulums, elbow flex/ext; AAROM flexion, table slides, AAROM ER.     Consulted and Agree with Plan of Care Patient      Patient will benefit from skilled therapeutic intervention in order to improve the following deficits and impairments:  Decreased range of motion, Pain, Impaired UE functional use, Decreased strength  Visit Diagnosis: Stiffness of right shoulder, not elsewhere classified  Acute pain of right shoulder  Muscle weakness (generalized)     Problem List There are no active problems to display for this patient.   Wynelle Fanny, PTA 06/17/2016, 5:34 PM  [facility] [address] [place], [place], [address] Phone: [phone]   Fax:  [phone]  Name: MICHOLAS DRUMWRIGHT MRN: [id number] Date of Birth: March 04, 1960

## 2016-06-22 ENCOUNTER — Encounter: Payer: Self-pay | Admitting: Physical Therapy

## 2016-06-22 ENCOUNTER — Ambulatory Visit: Payer: Worker's Compensation | Admitting: Physical Therapy

## 2016-06-22 DIAGNOSIS — M25611 Stiffness of right shoulder, not elsewhere classified: Secondary | ICD-10-CM | POA: Diagnosis not present

## 2016-06-22 DIAGNOSIS — M25511 Pain in right shoulder: Secondary | ICD-10-CM

## 2016-06-22 DIAGNOSIS — M6281 Muscle weakness (generalized): Secondary | ICD-10-CM

## 2016-06-22 NOTE — Therapy (Signed)
[facility] [place], [place], [address] Phone: [phone]   Fax:  [phone]  Physical Therapy Treatment  Patient Details  Name: Scott Chapman MRN: [id number] Date of Birth: 05-26-1960 Referring Provider: Jerilynn Mages PA-C  Encounter Date: 06/22/2016      PT End of Session - 06/22/16 1648    Visit Number 60   Number of Visits 25   Date for PT Re-Evaluation 07/01/16   PT Start Time 1645   PT Stop Time 1729   PT Time Calculation (min) 44 min   Activity Tolerance Patient tolerated treatment well   Behavior During Therapy [facility] for tasks assessed/performed      Past Medical History:  Diagnosis Date  . Allergy   . Hyperlipidemia   . Hypertension     Past Surgical History:  Procedure Laterality Date  . Colapsed lung Right   . HERNIA REPAIR    . left arm surgery    . TENDON REPAIR Right    arm    There were no vitals filed for this visit.      Subjective Assessment - 06/22/16 1648    Subjective No new complaints from patient.   Pertinent History HTN, asthma   Currently in Pain? Yes   Pain Score 2    Pain Location Shoulder   Pain Orientation Right   Pain Descriptors / Indicators Discomfort   Pain Type Surgical pain   Pain Onset More than a month ago            [facility] PT Assessment - 06/22/16 0001      Assessment   Medical Diagnosis s/p R RCR   Onset Date/Surgical Date 10/14/15   Hand Dominance Right   Next MD Visit 06/30/2016     Precautions   Precautions Shoulder   Type of Shoulder Precautions RCR see protocol                     [facility] Adult PT Treatment/Exercise - 06/22/16 0001      Elbow Exercises   Elbow Flexion Strengthening;Both;Other reps (comment);Standing;Bar weights/barbell   Bar Weights/Barbell (Elbow Flexion) Other (comment)  7#   Elbow Flexion Limitations 6x10 reps   Elbow Extension Strengthening;Both   Elbow Extension Limitations Orange XTS 6x10 reps     Shoulder Exercises: Prone   Retraction Strengthening;Right;Weights   Retraction Weight (lbs) 7#   Retraction Limitations 6 x10 reps   Extension Strengthening;Right;Weights  6x10 reps   Extension Weight (lbs) 7#     Shoulder Exercises: Standing   Flexion Strengthening;Both;Weights   Shoulder Flexion Weight (lbs) 7   Flexion Limitations 6x10 reps   Other Standing Exercises R shoulder D1 and D2, OH lift 4# 6x10 reps; B snow angel x20 reps   Other Standing Exercises Standing B lat pulldown, punches orange xts 6x10 reps     Shoulder Exercises: ROM/Strengthening   UBE (Upper Arm Bike) 90 RPM x8 min   Wall Wash CW and CCW x30 reps each   Wall Pushups Other (comment)  6x10 reps                  PT Short Term Goals - 05/13/16 1652      PT SHORT TERM GOAL #1   Title I with HEP   Time 4   Period Weeks   Status Achieved     PT SHORT TERM GOAL #2   Title PROM of R shoulder to Protocol limits   Time 4  Period Weeks   Status Achieved           PT Long Term Goals - 05/13/16 1742      PT LONG TERM GOAL #1   Title R shoulder ROM WFL to perform ADLs   Time 8   Period Weeks   Status Achieved     PT LONG TERM GOAL #2   Title R shoulder strength 5/5 to allow RTW   Time 8   Period Weeks   Status Partially Met  R shoulder 5/5 throughout except ER 4+/5 05/13/2016     PT LONG TERM GOAL #3   Title Able to peform ADLS with pain 3/90  or less in R shoulder   Time 8   Period Weeks   Status Achieved               Plan - 06/22/16 1731    Clinical Impression Statement Patient tolerated today's treatment well as he was progressed through strengthening exercises and new exercises today. Less fatigue reported by patient during today's treatment today. No compensatory strategies noted during today's treatment.   Rehab Potential Excellent   Clinical Impairments Affecting Rehab Potential R biceps tendon repair 2015 current surgery 10/14/15    PT Frequency 2x / week   PT  Duration 8 weeks   PT Treatment/Interventions ADLs/Self Care Home Management;Electrical Stimulation;Cryotherapy;Ultrasound;Neuromuscular re-education;Therapeutic exercise;Manual techniques;Scar mobilization;Passive range of motion;Vasopneumatic Device;Patient/family education   PT Next Visit Plan Continue with shoulder strengthening and endurance per MPT POC.   PT Home Exercise Plan supine ER/IR with cane, pendulums, elbow flex/ext; AAROM flexion, table slides, AAROM ER.     Consulted and Agree with Plan of Care Patient      Patient will benefit from skilled therapeutic intervention in order to improve the following deficits and impairments:  Decreased range of motion, Pain, Impaired UE functional use, Decreased strength  Visit Diagnosis: Stiffness of right shoulder, not elsewhere classified  Acute pain of right shoulder  Muscle weakness (generalized)     Problem List There are no active problems to display for this patient.   Wynelle Fanny, PTA 06/22/2016, 5:34 PM  [facility] [address] [place], [place], [address] Phone: [phone]   Fax:  [phone]  Name: MESSI TWEDT MRN: [id number] Date of Birth: 03/04/1960

## 2016-06-24 ENCOUNTER — Ambulatory Visit: Payer: Worker's Compensation | Admitting: *Deleted

## 2016-06-24 DIAGNOSIS — M6281 Muscle weakness (generalized): Secondary | ICD-10-CM

## 2016-06-24 DIAGNOSIS — M25611 Stiffness of right shoulder, not elsewhere classified: Secondary | ICD-10-CM | POA: Diagnosis not present

## 2016-06-24 DIAGNOSIS — M25511 Pain in right shoulder: Secondary | ICD-10-CM

## 2016-06-24 NOTE — Therapy (Signed)
[facility] [place], [place], [address] Phone: [phone]   Fax:  [phone]  Physical Therapy Treatment  Patient Details  Name: Scott Chapman MRN: [id number] Date of Birth: May 11, 1960 Referring Provider: Jerilynn Mages PA-C  Encounter Date: 06/24/2016      PT End of Session - 06/24/16 1719    Visit Number 58   Number of Visits 3   Date for PT Re-Evaluation 07/01/16   Authorization Type To MD 5 -18   PT Start Time 1645   PT Stop Time 5852   PT Time Calculation (min) 53 min      Past Medical History:  Diagnosis Date  . Allergy   . Hyperlipidemia   . Hypertension     Past Surgical History:  Procedure Laterality Date  . Colapsed lung Right   . HERNIA REPAIR    . left arm surgery    . TENDON REPAIR Right    arm    There were no vitals filed for this visit.      Subjective Assessment - 06/24/16 1703    Subjective Doing good today   Pertinent History HTN, asthma   Currently in Pain? Yes   Pain Score 2    Pain Location Shoulder   Pain Orientation Right   Pain Descriptors / Indicators Discomfort                         [facility] Adult PT Treatment/Exercise - 06/24/16 0001      Elbow Exercises   Elbow Flexion Strengthening;Both;Other reps (comment);Standing;Bar weights/barbell   Bar Weights/Barbell (Elbow Flexion) Other (comment)  7#  6x10   Elbow Extension Strengthening;Both  XTS orange 6x 10     Shoulder Exercises: Prone   Retraction Strengthening;Right;Weights   Retraction Weight (lbs) 7#   Retraction Limitations 6 x10 reps   Extension Strengthening;Right;Weights  6x10 reps   Extension Weight (lbs) 7#     Shoulder Exercises: Standing   Flexion Strengthening;Both;Weights   Shoulder Flexion Weight (lbs) 7   Flexion Limitations 6x10 reps   Shoulder ABduction Weight (lbs) 7   ABduction Limitations 6x10 reps   Shoulder Elevation Right  7# overhead press 6x10   Other  Standing Exercises R shoulder D1 and D2, OH lift 4# 6x10 reps; B snow angel x20 reps   Other Standing Exercises Standing B lat pulldown, punches orange xts 6x10 reps     Shoulder Exercises: ROM/Strengthening   UBE (Upper Arm Bike) 90 RPM x8 min   Wall Pushups Other (comment)  6x10 reps   counter pushups                  PT Short Term Goals - 05/13/16 1652      PT SHORT TERM GOAL #1   Title I with HEP   Time 4   Period Weeks   Status Achieved     PT SHORT TERM GOAL #2   Title PROM of R shoulder to Protocol limits   Time 4   Period Weeks   Status Achieved           PT Long Term Goals - 05/13/16 1742      PT LONG TERM GOAL #1   Title R shoulder ROM WFL to perform ADLs   Time 8   Period Weeks   Status Achieved     PT LONG TERM GOAL #2   Title R shoulder strength 5/5 to allow RTW   Time  8   Period Weeks   Status Partially Met  R shoulder 5/5 throughout except ER 4+/5 05/13/2016     PT LONG TERM GOAL #3   Title Able to peform ADLS with pain 8/65  or less in R shoulder   Time 8   Period Weeks   Status Achieved               Plan - 06/24/16 1729    Clinical Impression Statement Pt arrived doing fairly well today and was able to complete all therex and act.'s for RT shldr with mainly fatigue. Did great with new PREs. MD note next visit   Rehab Potential Excellent   Clinical Impairments Affecting Rehab Potential R biceps tendon repair 2015 current surgery 10/14/15    PT Frequency 2x / week   PT Duration 8 weeks   PT Treatment/Interventions ADLs/Self Care Home Management;Electrical Stimulation;Cryotherapy;Ultrasound;Neuromuscular re-education;Therapeutic exercise;Manual techniques;Scar mobilization;Passive range of motion;Vasopneumatic Device;Patient/family education   PT Next Visit Plan Continue with shoulder strengthening and endurance per MPT POC.   PT Home Exercise Plan supine ER/IR with cane, pendulums, elbow flex/ext; AAROM flexion, table slides,  AAROM ER.     Consulted and Agree with Plan of Care Patient      Patient will benefit from skilled therapeutic intervention in order to improve the following deficits and impairments:  Decreased range of motion, Pain, Impaired UE functional use, Decreased strength  Visit Diagnosis: Stiffness of right shoulder, not elsewhere classified  Acute pain of right shoulder  Muscle weakness (generalized)     Problem List There are no active problems to display for this patient.   Lional Icenogle,CHRIS, PTA 06/24/2016, 5:38 PM  [facility] [place], [place], [address] Phone: [phone]   Fax:  [phone]  Name: Scott Chapman MRN: [id number] Date of Birth: 1960-08-31

## 2016-06-29 ENCOUNTER — Encounter: Payer: Self-pay | Admitting: Physical Therapy

## 2016-07-06 ENCOUNTER — Encounter: Payer: Self-pay | Admitting: Physical Therapy

## 2016-07-06 ENCOUNTER — Ambulatory Visit: Payer: Worker's Compensation | Admitting: Physical Therapy

## 2016-07-06 DIAGNOSIS — M6281 Muscle weakness (generalized): Secondary | ICD-10-CM

## 2016-07-06 DIAGNOSIS — M25611 Stiffness of right shoulder, not elsewhere classified: Secondary | ICD-10-CM

## 2016-07-06 DIAGNOSIS — M25511 Pain in right shoulder: Secondary | ICD-10-CM

## 2016-07-06 NOTE — Therapy (Signed)
[facility] [place], [place], [address] Phone: [phone]   Fax:  [phone]  Physical Therapy Treatment  Patient Details  Name: Scott Chapman MRN: [id number] Date of Birth: 15-May-1960 Referring Provider: Jerilynn Mages PA-C  Encounter Date: 07/06/2016      PT End of Session - 07/06/16 8325    Visit Number 54   Number of Visits 42   Date for PT Re-Evaluation 07/01/16   PT Start Time 1640   PT Stop Time 4982   PT Time Calculation (min) 39 min   Activity Tolerance Patient tolerated treatment well   Behavior During Therapy [facility] for tasks assessed/performed      Past Medical History:  Diagnosis Date  . Allergy   . Hyperlipidemia   . Hypertension     Past Surgical History:  Procedure Laterality Date  . Colapsed lung Right   . HERNIA REPAIR    . left arm surgery    . TENDON REPAIR Right    arm    There were no vitals filed for this visit.      Subjective Assessment - 07/06/16 1640    Subjective Reports that MD said that he could do overhead lifting and states that he is pleased with his shoulder at this time. States that MD said to stop worrying and to continue strengthening for one month.   Pertinent History HTN, asthma   Currently in Pain? No/denies            [facility] PT Assessment - 07/06/16 0001      Assessment   Medical Diagnosis s/p R RCR   Onset Date/Surgical Date 10/14/15   Hand Dominance Right   Next MD Visit 07/30/2016     Precautions   Precautions Shoulder   Type of Shoulder Precautions RCR see protocol                     [facility] Adult PT Treatment/Exercise - 07/06/16 0001      Elbow Exercises   Elbow Flexion Strengthening;Both;Other reps (comment);Standing;Bar weights/barbell   Bar Weights/Barbell (Elbow Flexion) Other (comment)  50#   Elbow Flexion Limitations 3x10 reps   Elbow Extension Strengthening;Right;Other reps (comment);Standing;Bar weights/barbell   Bar  Weights/Barbell (Elbow Extension) Other (comment)  40#    Elbow Extension Limitations 3x10 reps     Shoulder Exercises: Seated   Other Seated Exercises B chest press 50# 3x10 reps, B lat pulldown 50# 3x10 reps   Other Seated Exercises B row 50# 3x10 reps, high row 50# 3x10 reps     Shoulder Exercises: Sidelying   External Rotation Strengthening;Right;Weights   External Rotation Weight (lbs) 3   External Rotation Limitations 5x10 reps     Shoulder Exercises: Standing   Protraction Strengthening;Right;Weights   Protraction Weight (lbs) 40   Protraction Limitations 3x10 reps   Internal Rotation Strengthening;Right;Weights   Internal Rotation Weight (lbs) 40   Internal Rotation Limitations 3x10 reps   Extension Strengthening;Right;Weights   Extension Weight (lbs) 40   Extension Limitations 3x10 reps     Shoulder Exercises: ROM/Strengthening   UBE (Upper Arm Bike) 90 RPM x8 min   Wall Pushups Other (comment)  6x10 reps                  PT Short Term Goals - 05/13/16 1652      PT SHORT TERM GOAL #1   Title I with HEP   Time 4   Period Weeks   Status Achieved  PT SHORT TERM GOAL #2   Title PROM of R shoulder to Protocol limits   Time 4   Period Weeks   Status Achieved           PT Long Term Goals - 05/13/16 1742      PT LONG TERM GOAL #1   Title R shoulder ROM WFL to perform ADLs   Time 8   Period Weeks   Status Achieved     PT LONG TERM GOAL #2   Title R shoulder strength 5/5 to allow RTW   Time 8   Period Weeks   Status Partially Met  R shoulder 5/5 throughout except ER 4+/5 05/13/2016     PT LONG TERM GOAL #3   Title Able to peform ADLS with pain 3/64  or less in R shoulder   Time 8   Period Weeks   Status Achieved               Plan - 07/06/16 1720    Clinical Impression Statement Patient continues to progress per MD wishes in regards to R shoulder strength. Patient progressed to various machine strengthening exercises today  with only complaint of fatigue reported by patient. Patient experienced decreased fatigue and weakness with SL ER with 3# handweight today per patient report.    Rehab Potential Excellent   Clinical Impairments Affecting Rehab Potential R biceps tendon repair 2015 current surgery 10/14/15    PT Frequency 2x / week   PT Duration 8 weeks   PT Treatment/Interventions ADLs/Self Care Home Management;Electrical Stimulation;Cryotherapy;Ultrasound;Neuromuscular re-education;Therapeutic exercise;Manual techniques;Scar mobilization;Passive range of motion;Vasopneumatic Device;Patient/family education   PT Next Visit Plan Continue with shoulder strengthening and endurance per MPT POC.   PT Home Exercise Plan supine ER/IR with cane, pendulums, elbow flex/ext; AAROM flexion, table slides, AAROM ER.     Consulted and Agree with Plan of Care Patient      Patient will benefit from skilled therapeutic intervention in order to improve the following deficits and impairments:  Decreased range of motion, Pain, Impaired UE functional use, Decreased strength  Visit Diagnosis: Stiffness of right shoulder, not elsewhere classified  Acute pain of right shoulder  Muscle weakness (generalized)     Problem List There are no active problems to display for this patient.   Ahmed Prima, PTA 07/06/16 5:28 PM  [facility] [place], [place], [address] Phone: [phone]   Fax:  [phone]  Name: Scott Chapman MRN: [id number] Date of Birth: Feb 09, 1960

## 2016-07-08 ENCOUNTER — Ambulatory Visit: Payer: Worker's Compensation | Admitting: *Deleted

## 2016-07-08 DIAGNOSIS — M25611 Stiffness of right shoulder, not elsewhere classified: Secondary | ICD-10-CM | POA: Diagnosis not present

## 2016-07-08 DIAGNOSIS — M25511 Pain in right shoulder: Secondary | ICD-10-CM

## 2016-07-08 DIAGNOSIS — M6281 Muscle weakness (generalized): Secondary | ICD-10-CM

## 2016-07-08 NOTE — Therapy (Signed)
[facility] [address] [place], [place], [address] Phone: [phone]   Fax:  [phone]  Physical Therapy Treatment  Patient Details  Name: Scott CockayneJames M Chapman MRN: [id number] Date of Birth: 1960-07-20 Referring Provider: Marciano SequinBryson Stillwell PA-C  Encounter Date: 07/08/2016      PT End of Session - 07/08/16 1741    Visit Number 63   Number of Visits 70   Date for PT Re-Evaluation 08/05/16   PT Start Time 1645   PT Stop Time 1735   PT Time Calculation (min) 50 min      Past Medical History:  Diagnosis Date  . Allergy   . Hyperlipidemia   . Hypertension     Past Surgical History:  Procedure Laterality Date  . Colapsed lung Right   . HERNIA REPAIR    . left arm surgery    . TENDON REPAIR Right    arm    There were no vitals filed for this visit.      Subjective Assessment - 07/08/16 1655    Subjective Reports that MD said that he could do overhead lifting and states that he is pleased with his shoulder at this time. States that MD said to stop worrying and to continue strengthening for one month.                         [facility] Treatment/Exercise - 07/08/16 0001      Elbow Exercises   Elbow Flexion Strengthening;Both;Other reps (comment);Standing;Bar weights/barbell  9# x 60   Elbow Extension Strengthening;Both  XTS orange 6x 10     Shoulder Exercises: Seated   Other Seated Exercises B chest press 50# 3x10 reps, B lat pulldown 50# 3x10 reps   Other Seated Exercises B row 50# 3x10 reps, high row 50# 3x10 reps     Shoulder Exercises: Standing   External Rotation Strengthening;Right  90/90 on cabinet 3# 3 x fatigue   Shoulder Elevation Right  9 # overhead press 4x10     Shoulder Exercises: ROM/Strengthening   UBE (Upper Arm Bike) 90 RPM x8 min   Wall Pushups Other (comment)  8x10 reps                  PT Short Term Goals - 05/13/16 1652      PT SHORT TERM GOAL #1   Title I  with HEP   Time 4   Period Weeks   Status Achieved     PT SHORT TERM GOAL #2   Title PROM of R shoulder to Protocol limits   Time 4   Period Weeks   Status Achieved           PT Long Term Goals - 07/06/16 1801      PT LONG TERM GOAL #4   Title Patient able to perform shoulder endurance drill with body weight through right shoulder on plexiglass times 3 minutes without stopping.   Time 4   Period Weeks   Status New               Plan - 07/08/16 1736    Clinical Impression Statement Pt was sore from last Rx due to progression of exs, and was able to perform all again with minimal problems. Mainly fatigue. Challenged mostly with ER at 90 degrees of elevation.   Rehab Potential Excellent   Clinical Impairments Affecting Rehab Potential R biceps tendon repair 2015 current surgery 10/14/15  PT Frequency 2x / week   PT Duration 8 weeks   PT Treatment/Interventions ADLs/Self Care Home Management;Electrical Stimulation;Cryotherapy;Ultrasound;Neuromuscular re-education;Therapeutic exercise;Manual techniques;Scar mobilization;Passive range of motion;Vasopneumatic Device;Patient/family education   PT Next Visit Plan Continue with shoulder strengthening and endurance per MPT POC.   PT Home Exercise Plan supine ER/IR with cane, pendulums, elbow flex/ext; AAROM flexion, table slides, AAROM ER.     Consulted and Agree with Plan of Care Patient      Patient will benefit from skilled therapeutic intervention in order to improve the following deficits and impairments:  Decreased range of motion, Pain, Impaired UE functional use, Decreased strength  Visit Diagnosis: Stiffness of right shoulder, not elsewhere classified  Acute pain of right shoulder  Muscle weakness (generalized)     Problem List There are no active problems to display for this patient.   Kathelyn Gombos,CHRIS, PTA 07/08/2016, 5:42 PM  [facility] [address] [place], [place], [address] Phone: [phone]   Fax:  [phone]  Name: Scott Chapman MRN: [id number] Date of Birth: May 29, 1960

## 2016-07-13 ENCOUNTER — Ambulatory Visit: Payer: Worker's Compensation | Admitting: *Deleted

## 2016-07-15 ENCOUNTER — Encounter: Payer: Self-pay | Admitting: Physical Therapy

## 2016-07-20 ENCOUNTER — Ambulatory Visit: Payer: Worker's Compensation | Attending: Orthopedic Surgery | Admitting: Physical Therapy

## 2016-07-20 ENCOUNTER — Encounter: Payer: Self-pay | Admitting: Physical Therapy

## 2016-07-20 DIAGNOSIS — M25511 Pain in right shoulder: Secondary | ICD-10-CM | POA: Diagnosis present

## 2016-07-20 DIAGNOSIS — M25611 Stiffness of right shoulder, not elsewhere classified: Secondary | ICD-10-CM | POA: Diagnosis present

## 2016-07-20 DIAGNOSIS — M6281 Muscle weakness (generalized): Secondary | ICD-10-CM | POA: Diagnosis present

## 2016-07-20 NOTE — Therapy (Signed)
[facility] [address] [place], [place], [address] Phone: [phone]   Fax:  [phone]  Physical Therapy Treatment  Patient Details  Name: Scott Chapman MRN: [id number] Date of Birth: Mar 31, 1960 Referring Provider: Marciano Sequin PA-C  Encounter Date: 07/20/2016      PT End of Session - 07/20/16 1704    Visit Number 64   Number of Visits 70   Date for PT Re-Evaluation 08/05/16   PT Start Time 1644   PT Stop Time 1729   PT Time Calculation (min) 45 min   Activity Tolerance Patient tolerated treatment well   Behavior During Therapy [facility] for tasks assessed/performed      Past Medical History:  Diagnosis Date  . Allergy   . Hyperlipidemia   . Hypertension     Past Surgical History:  Procedure Laterality Date  . Colapsed lung Right   . HERNIA REPAIR    . left arm surgery    . TENDON REPAIR Right    arm    There were no vitals filed for this visit.      Subjective Assessment - 07/20/16 1703    Subjective Reports he is tired today. Reports that his shoulder has been doing good at work and that at times he feels knotted up.   Pertinent History HTN, asthma   Currently in Pain? Yes   Pain Score 2    Pain Location Shoulder   Pain Orientation Right   Pain Descriptors / Indicators Discomfort   Pain Type Surgical pain   Pain Onset More than a month ago            [facility] PT Assessment - 07/20/16 0001      Assessment   Medical Diagnosis s/p R RCR   Onset Date/Surgical Date 10/14/15   Hand Dominance Right   Next MD Visit 08/02/2016     Precautions   Precautions Shoulder   Type of Shoulder Precautions RCR see protocol                     [facility] Adult PT Treatment/Exercise - 07/20/16 0001      Elbow Exercises   Elbow Flexion Strengthening;Both;Other reps (comment);Standing;Bar weights/barbell   Bar Weights/Barbell (Elbow Flexion) Other (comment)  9#   Elbow Flexion Limitations 3x20 reps   Elbow  Extension Strengthening;Both   Bar Weights/Barbell (Elbow Extension) Other (comment)  Orange XTS   Elbow Extension Limitations 3x20 reps     Shoulder Exercises: Seated   Other Seated Exercises B chest press 50# 3x20 reps, B lat pulldown 50# 3x20 reps   Other Seated Exercises B row 50# 3x20 reps, high row 50# 3x20 reps     Shoulder Exercises: Standing   External Rotation Strengthening;Right;Weights  3x10 reps 90/90 position   External Rotation Weight (lbs) 4   Other Standing Exercises R shoulder OH press 9# 3x20 reps     Shoulder Exercises: ROM/Strengthening   UBE (Upper Arm Bike) 90 RPM x8 min                  PT Short Term Goals - 05/13/16 1652      PT SHORT TERM GOAL #1   Title I with HEP   Time 4   Period Weeks   Status Achieved     PT SHORT TERM GOAL #2   Title PROM of R shoulder to Protocol limits   Time 4   Period Weeks   Status Achieved  PT Long Term Goals - 07/06/16 1801      PT LONG TERM GOAL #4   Title Patient able to perform shoulder endurance drill with body weight through right shoulder on plexiglass times 3 minutes without stopping.   Time 4   Period Weeks   Status New               Plan - 07/20/16 1739    Clinical Impression Statement Patient continues to do very well in treatment with only reports of fatigue with exercises. Patient reported only minimal discomfort upon arrival but no soreness in R shoulder reported. Fatigue set in quickly with standing 90/90 ER with 4# weight fairly quickly.   Rehab Potential Excellent   Clinical Impairments Affecting Rehab Potential R biceps tendon repair 2015 current surgery 10/14/15    PT Frequency 2x / week   PT Duration 8 weeks   PT Treatment/Interventions ADLs/Self Care Home Management;Electrical Stimulation;Cryotherapy;Ultrasound;Neuromuscular re-education;Therapeutic exercise;Manual techniques;Scar mobilization;Passive range of motion;Vasopneumatic Device;Patient/family education    PT Next Visit Plan Continue with shoulder strengthening and endurance per MPT POC.   PT Home Exercise Plan supine ER/IR with cane, pendulums, elbow flex/ext; AAROM flexion, table slides, AAROM ER.     Consulted and Agree with Plan of Care Patient      Patient will benefit from skilled therapeutic intervention in order to improve the following deficits and impairments:  Decreased range of motion, Pain, Impaired UE functional use, Decreased strength  Visit Diagnosis: Stiffness of right shoulder, not elsewhere classified  Acute pain of right shoulder  Muscle weakness (generalized)     Problem List There are no active problems to display for this patient.   Evelene CroonKelsey M Parsons, PTA 07/20/2016, 5:42 PM  [facility] [address] [place], [place], [address] Phone: [phone]   Fax:  [phone]  Name: Scott Chapman MRN: [id number] Date of Birth: 24-May-1960

## 2016-07-22 ENCOUNTER — Ambulatory Visit: Payer: Worker's Compensation | Admitting: *Deleted

## 2016-07-22 DIAGNOSIS — M25611 Stiffness of right shoulder, not elsewhere classified: Secondary | ICD-10-CM | POA: Diagnosis not present

## 2016-07-22 DIAGNOSIS — M25511 Pain in right shoulder: Secondary | ICD-10-CM

## 2016-07-22 DIAGNOSIS — M6281 Muscle weakness (generalized): Secondary | ICD-10-CM

## 2016-07-22 NOTE — Therapy (Signed)
[facility] [address] [place], [place], [address] Phone: [phone]   Fax:  [phone]  Physical Therapy Treatment  Patient Details  Name: Scott CockayneJames M Chapman MRN: [id number] Date of Birth: Oct 31, 1960 Referring Provider: Marciano SequinBryson Stillwell PA-C  Encounter Date: 07/22/2016      PT End of Session - 07/22/16 1724    Visit Number 65   Number of Visits 70   Date for PT Re-Evaluation 08/05/16   Authorization Type To MD  7- 23   PT Start Time 1645   PT Stop Time 1730   PT Time Calculation (min) 45 min      Past Medical History:  Diagnosis Date  . Allergy   . Hyperlipidemia   . Hypertension     Past Surgical History:  Procedure Laterality Date  . Colapsed lung Right   . HERNIA REPAIR    . left arm surgery    . TENDON REPAIR Right    arm    There were no vitals filed for this visit.      Subjective Assessment - 07/22/16 1653    Subjective Reports he is tired today. Reports that his shoulder has been doing good at work and that at times he feels knotted up.   Pertinent History HTN, asthma                         [facility] Adult PT Treatment/Exercise - 07/22/16 0001      Elbow Exercises   Elbow Flexion Strengthening;Both;Other reps (comment);Standing;Bar weights/barbell  9#s 3x20   Bar Weights/Barbell (Elbow Flexion) Other (comment)  9#   Elbow Flexion Limitations 3x20   Elbow Extension Strengthening;Both   Bar Weights/Barbell (Elbow Extension) Other (comment)  Orange XTS   Elbow Extension Limitations 3x20     Shoulder Exercises: Seated   Other Seated Exercises B chest press 50# 3x20 reps, B lat pulldown 50# 3x20 reps   Other Seated Exercises B row 50# 3x20 reps, high row 50# 3x20 reps     Shoulder Exercises: Standing   External Rotation Strengthening;Right;Weights  3x20 reps 90/90 position   External Rotation Weight (lbs) 4   Other Standing Exercises R shoulder OH press 9# 3x20 reps     Shoulder  Exercises: ROM/Strengthening   UBE (Upper Arm Bike) 90 RPM x8 min   Wall Pushups Other (comment)  8x10 reps                  PT Short Term Goals - 05/13/16 1652      PT SHORT TERM GOAL #1   Title I with HEP   Time 4   Period Weeks   Status Achieved     PT SHORT TERM GOAL #2   Title PROM of R shoulder to Protocol limits   Time 4   Period Weeks   Status Achieved           PT Long Term Goals - 07/06/16 1801      PT LONG TERM GOAL #4   Title Patient able to perform shoulder endurance drill with body weight through right shoulder on plexiglass times 3 minutes without stopping.   Time 4   Period Weeks   Status New               Plan - 07/22/16 1726    Clinical Impression Statement Pt arrived to clinic doing fairly well today. Mainly soreness at times RT shldr. He was able to complete all therex  for strengthening and reports only fatigue. He did much better with ER at 90/90 today with 3x20. Good JOB   Rehab Potential Excellent   Clinical Impairments Affecting Rehab Potential R biceps tendon repair 2015 current surgery 10/14/15    PT Frequency 2x / week   PT Duration 8 weeks   PT Treatment/Interventions ADLs/Self Care Home Management;Electrical Stimulation;Cryotherapy;Ultrasound;Neuromuscular re-education;Therapeutic exercise;Manual techniques;Scar mobilization;Passive range of motion;Vasopneumatic Device;Patient/family education   PT Next Visit Plan Continue with shoulder strengthening and endurance per MPT POC.   PT Home Exercise Plan supine ER/IR with cane, pendulums, elbow flex/ext; AAROM flexion, table slides, AAROM ER.     Consulted and Agree with Plan of Care Patient      Patient will benefit from skilled therapeutic intervention in order to improve the following deficits and impairments:  Decreased range of motion, Pain, Impaired UE functional use, Decreased strength  Visit Diagnosis: Stiffness of right shoulder, not elsewhere classified  Acute  pain of right shoulder  Muscle weakness (generalized)     Problem List There are no active problems to display for this patient.   Michial Disney,CHRIS, PTA 07/22/2016, 5:44 PM  [facility] [address] [place], [place], [address] Phone: [phone]   Fax:  [phone]  Name: Scott Chapman MRN: [id number] Date of Birth: 1960/06/17

## 2016-07-27 ENCOUNTER — Ambulatory Visit: Payer: Worker's Compensation | Admitting: *Deleted

## 2016-07-27 DIAGNOSIS — M25611 Stiffness of right shoulder, not elsewhere classified: Secondary | ICD-10-CM | POA: Diagnosis not present

## 2016-07-27 DIAGNOSIS — M25511 Pain in right shoulder: Secondary | ICD-10-CM

## 2016-07-27 DIAGNOSIS — M6281 Muscle weakness (generalized): Secondary | ICD-10-CM

## 2016-07-29 ENCOUNTER — Ambulatory Visit: Payer: Worker's Compensation | Admitting: Physical Therapy

## 2016-07-29 ENCOUNTER — Encounter: Payer: Self-pay | Admitting: Physical Therapy

## 2016-07-29 DIAGNOSIS — M25611 Stiffness of right shoulder, not elsewhere classified: Secondary | ICD-10-CM

## 2016-07-29 DIAGNOSIS — M25511 Pain in right shoulder: Secondary | ICD-10-CM

## 2016-07-29 DIAGNOSIS — M6281 Muscle weakness (generalized): Secondary | ICD-10-CM

## 2016-07-29 NOTE — Therapy (Signed)
[facility] [address] [place], [place], [address] Phone: [phone]   Fax:  [phone]  Physical Therapy Treatment  Patient Details  Name: Scott CockayneJames M Zucco MRN: [id number] Date of Birth: 06-24-1960 Referring Provider: Marciano SequinBryson Stillwell PA-C  Encounter Date: 07/27/2016    Past Medical History:  Diagnosis Date  . Allergy   . Hyperlipidemia   . Hypertension     Past Surgical History:  Procedure Laterality Date  . Colapsed lung Right   . HERNIA REPAIR    . left arm surgery    . TENDON REPAIR Right    arm    There were no vitals filed for this visit.   see flow sheet Rx                              PT Short Term Goals - 05/13/16 1652      PT SHORT TERM GOAL #1   Title I with HEP   Time 4   Period Weeks   Status Achieved     PT SHORT TERM GOAL #2   Title PROM of R shoulder to Protocol limits   Time 4   Period Weeks   Status Achieved           PT Long Term Goals - 07/06/16 1801      PT LONG TERM GOAL #4   Title Patient able to perform shoulder endurance drill with body weight through right shoulder on plexiglass times 3 minutes without stopping.   Time 4   Period Weeks   Status New             Patient will benefit from skilled therapeutic intervention in order to improve the following deficits and impairments:  Decreased range of motion, Pain, Impaired UE functional use, Decreased strength  Visit Diagnosis: Stiffness of right shoulder, not elsewhere classified  Acute pain of right shoulder  Muscle weakness (generalized)     Problem List There are no active problems to display for this patient.   RAMSEUR,CHRIS, PTA 07/29/2016, 11:04 AM  [facility] [address] [place], [place], [address] Phone: [phone]   Fax:  [phone]  Name: Scott CockayneJames M Steyer MRN: [id number] Date of Birth: 06-24-1960

## 2016-07-29 NOTE — Therapy (Addendum)
[facility] [place], [place], [address] Phone: [phone]   Fax:  [phone]  Physical Therapy Treatment  Patient Details  Name: Scott Chapman MRN: [id number] Date of Birth: [birth year] Referring Provider: Jerilynn Mages PA-C  Encounter Date: 07/29/2016      PT End of Session - 07/29/16 1612    Visit Number 78   Number of Visits 70   Date for PT Re-Evaluation 08/05/16   Authorization Type To MD  7- 23   PT Start Time 1608   PT Stop Time 1648   PT Time Calculation (min) 40 min   Activity Tolerance Patient tolerated treatment well   Behavior During Therapy [facility] for tasks assessed/performed      Past Medical History:  Diagnosis Date  . Allergy   . Hyperlipidemia   . Hypertension     Past Surgical History:  Procedure Laterality Date  . Colapsed lung Right   . HERNIA REPAIR    . left arm surgery    . TENDON REPAIR Right    arm    There were no vitals filed for this visit.      Subjective Assessment - 07/29/16 1611    Subjective Reports that R shoulder feels good but still not as good as the L shoulder.   Pertinent History HTN, asthma   Currently in Pain? Yes   Pain Score 2    Pain Location Shoulder   Pain Orientation Right   Pain Descriptors / Indicators Discomfort   Pain Type Surgical pain   Pain Onset More than a month ago                                   PT Short Term Goals - 05/13/16 1652      PT SHORT TERM GOAL #1   Title I with HEP   Time 4   Period Weeks   Status Achieved     PT SHORT TERM GOAL #2   Title PROM of R shoulder to Protocol limits   Time 4   Period Weeks   Status Achieved           PT Long Term Goals - 07/29/16 1614      PT LONG TERM GOAL #1   Title R shoulder ROM WFL to perform ADLs   Time 8   Period Weeks   Status Achieved     PT LONG TERM GOAL #2   Title R shoulder strength 5/5 to allow RTW   Time 8   Period Weeks   Status  Partially Met  R shoulder 5/5 throughout except for R shoulder ER 4+/5 07/29/2016     PT LONG TERM GOAL #3   Title Able to peform ADLS with pain 0/62  or less in R shoulder   Time 8   Period Weeks   Status Achieved     PT LONG TERM GOAL #4   Title Patient able to perform shoulder endurance drill with body weight through right shoulder on plexiglass times 3 minutes without stopping.   Time 4   Period Weeks   Status Achieved               Plan - 07/29/16 1653    Clinical Impression Statement Patient has progressed greatly during PT following R RCR. Patient has progressed from theraband resisted to machine shoulder strengthening with only complaint of fatigue. R shoulder MMT  measured as 5/5 throughout except R shoulder ER still 4+/5. Patient has progressed and achieved all goals except for partially achieved MMT goal.   Rehab Potential Excellent   Clinical Impairments Affecting Rehab Potential R biceps tendon repair 2015 current surgery 10/14/15    PT Frequency 2x / week   PT Duration 8 weeks   PT Treatment/Interventions ADLs/Self Care Home Management;Electrical Stimulation;Cryotherapy;Ultrasound;Neuromuscular re-education;Therapeutic exercise;Manual techniques;Scar mobilization;Passive range of motion;Vasopneumatic Device;Patient/family education   PT Next Visit Plan Continue per MD discretion   PT Home Exercise Plan supine ER/IR with cane, pendulums, elbow flex/ext; AAROM flexion, table slides, AAROM ER.     Consulted and Agree with Plan of Care Patient      Patient will benefit from skilled therapeutic intervention in order to improve the following deficits and impairments:  Decreased range of motion, Pain, Impaired UE functional use, Decreased strength  Visit Diagnosis: Stiffness of right shoulder, not elsewhere classified  Acute pain of right shoulder  Muscle weakness (generalized)     Problem List There are no active problems to display for this patient.  Ahmed Prima, PTA 07/30/16 11:03 AM Mali Applegate MPT [facility] [address] [place], [place], [address] Phone: [phone]   Fax:  [phone]  Name: Scott Chapman MRN: [id number] Date of Birth: November 20, 1960  PHYSICAL THERAPY DISCHARGE SUMMARY  Visits from Start of Care: 92.    Current functional level related to goals / functional outcomes: See above.   Remaining deficits: See goal section.   Education / Equipment: HEP. Plan: Patient agrees to discharge.  Patient goals were partially met. Patient is being discharged due to meeting the stated rehab goals.  ?????         Mali Applegate MPT

## 2017-11-13 IMAGING — DX DG SHOULDER 2+V*R*
3 series · 3 of 3 positions shown · non-contrast
Comparison: Chest x-ray 03/31/2011.

CLINICAL DATA: Slipped in height dry like fluid. Right shoulder
pain all the way across shoulder. Initial encounter.

EXAM:
RIGHT SHOULDER - 2+ VIEW

[shoulder ap]
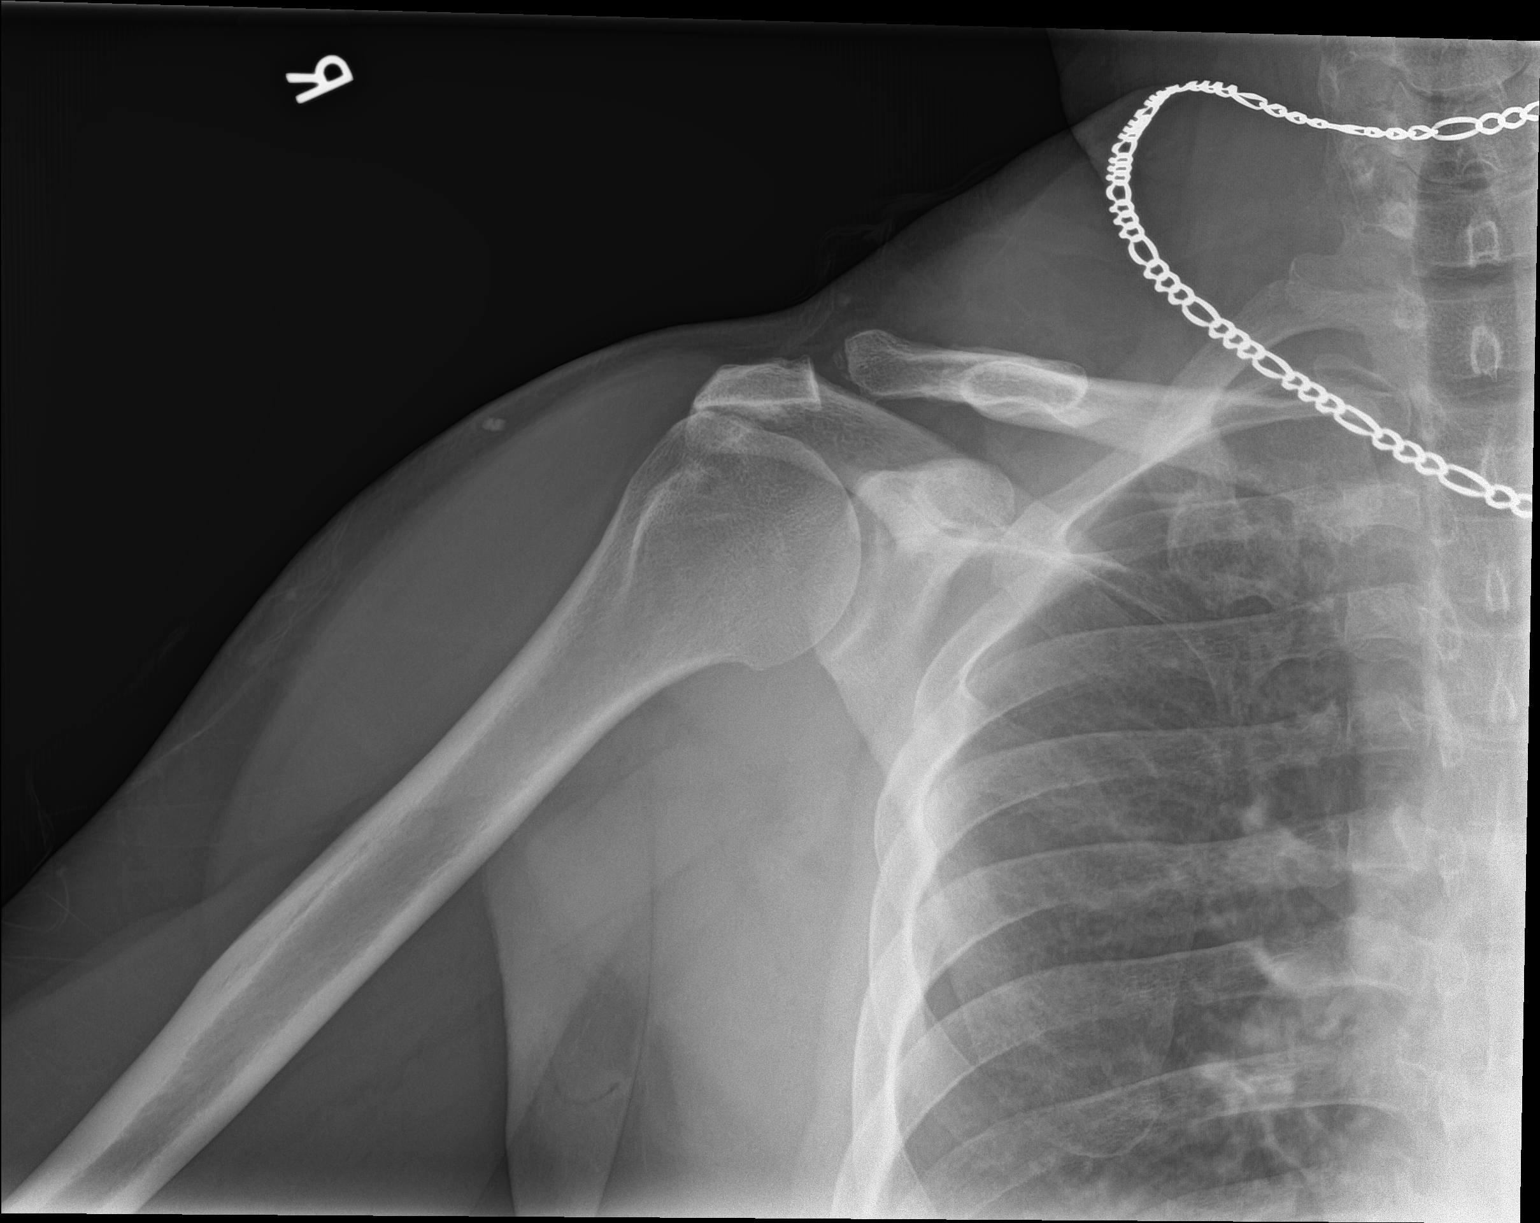

[shoulder obl]
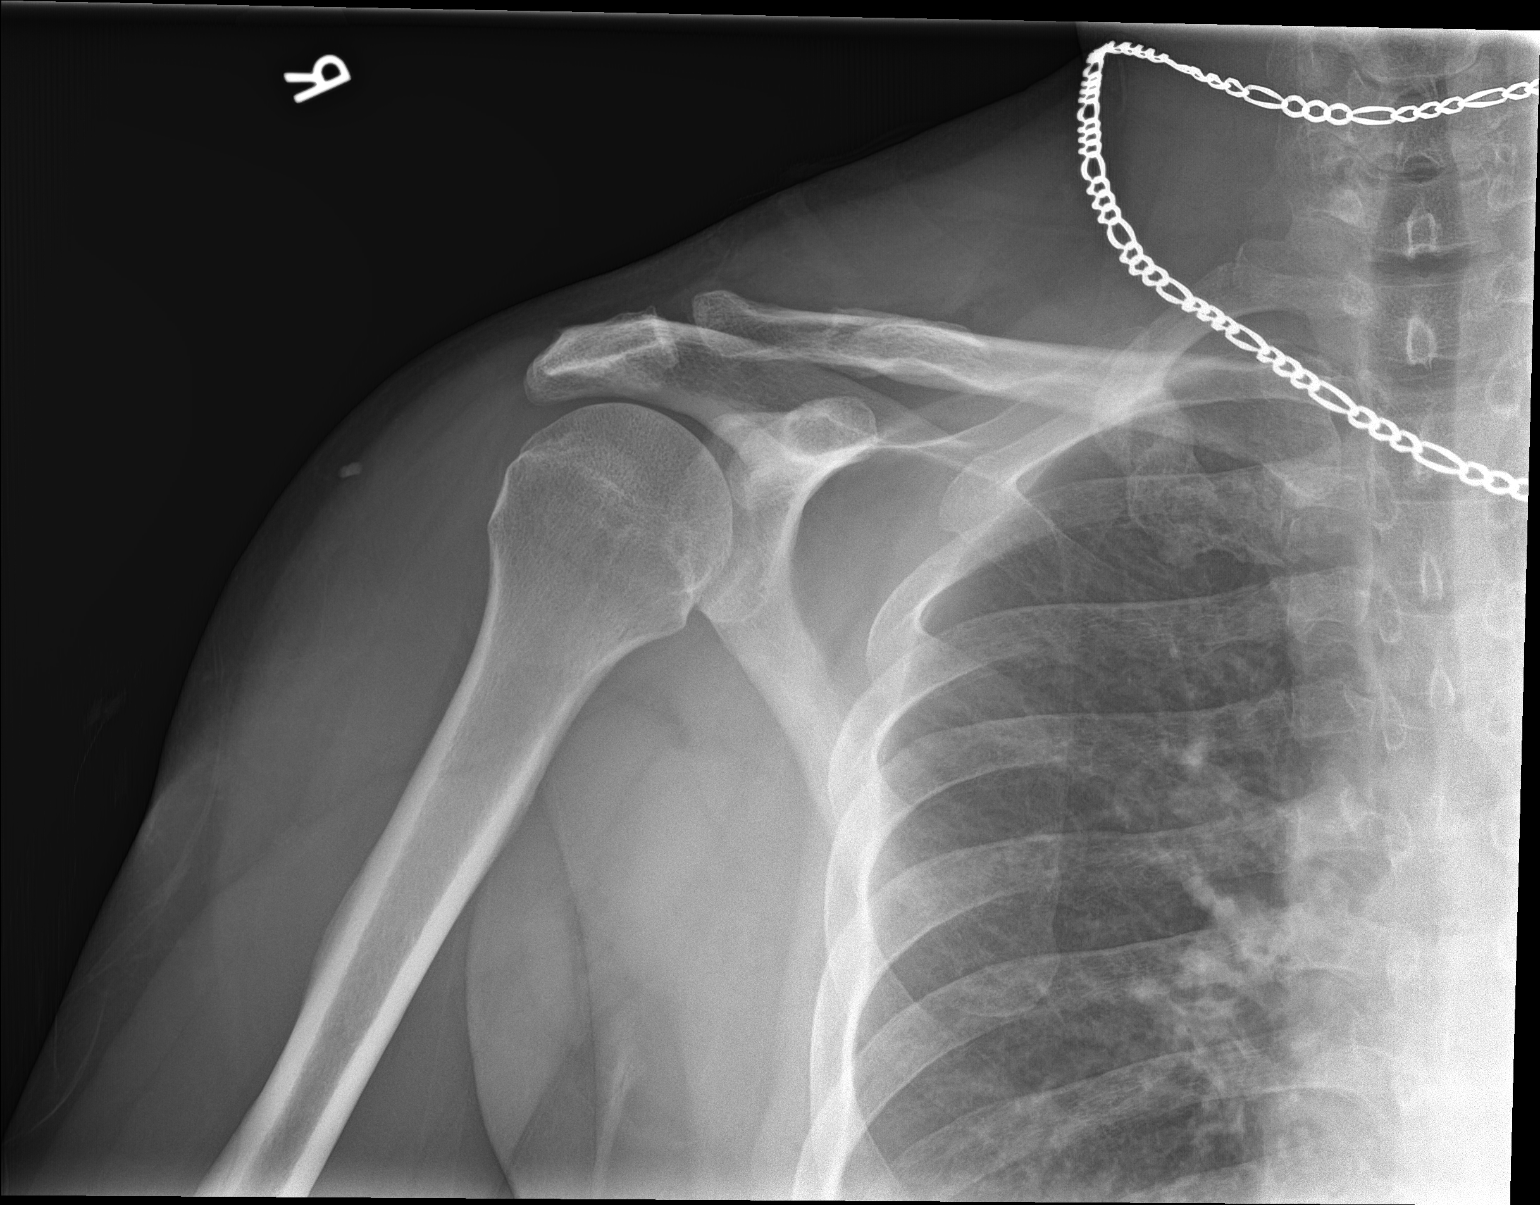

[shoulder axial]
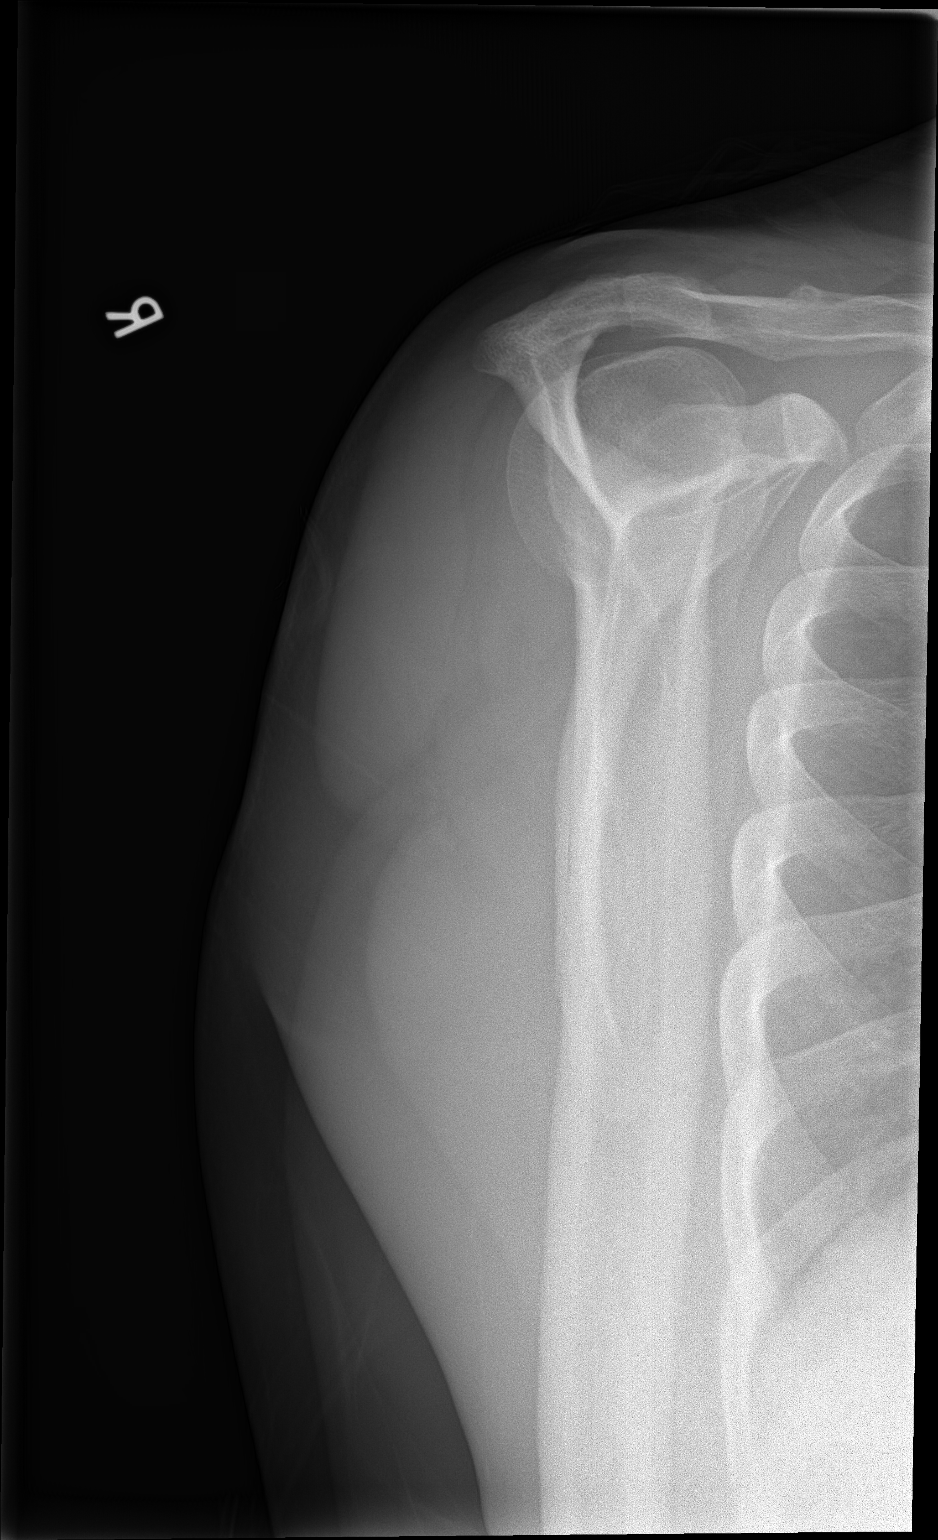

[3 of 3 positions shown; findings below may reference images not displayed]

FINDINGS: Two views study shows no fracture. No evidence for shoulder
separation or dislocation.
IMPRESSION: Negative.

## 2018-08-12 DEATH — deceased

## 2018-08-31 ENCOUNTER — Ambulatory Visit: Payer: BC Managed Care – PPO | Attending: Specialist | Admitting: Physical Therapy

## 2018-08-31 ENCOUNTER — Encounter: Payer: Self-pay | Admitting: Physical Therapy

## 2018-08-31 ENCOUNTER — Other Ambulatory Visit: Payer: Self-pay

## 2018-08-31 DIAGNOSIS — M25561 Pain in right knee: Secondary | ICD-10-CM | POA: Insufficient documentation

## 2018-08-31 NOTE — Therapy (Signed)
[facility] [address] [place], [place], [address] Phone: [phone]   Fax:  [phone]  Physical Therapy Evaluation  Patient Details  Name: Scott Chapman MRN: [id number] Date of Birth: Feb 11, 1960 Referring Provider (PT): Eugenia Mcalpineobert Collins, MD   Encounter Date: 08/31/2018  PT End of Session - 08/31/18 1314    Visit Number  1    Number of Visits  12    Date for PT Re-Evaluation  10/05/18    Authorization Type  FOTO; Progress note every 10th visit    PT Start Time  1115    PT Stop Time  1200    PT Time Calculation (min)  45 min    Activity Tolerance  Patient tolerated treatment well    Behavior During Therapy  [facility] for tasks assessed/performed       Past Medical History:  Diagnosis Date  . Allergy   . Hyperlipidemia   . Hypertension     Past Surgical History:  Procedure Laterality Date  . Colapsed lung Right   . HERNIA REPAIR    . left arm surgery    . TENDON REPAIR Right    arm    There were no vitals filed for this visit.   Subjective Assessment - 08/31/18 1307    Subjective  COVID-19 screening performed upon arrival.Patient arrives to physical therapy with reports of difficulty walking and right knee pain secondary to a right knee scope on 08/24/2018. Patient reports fell in his backyard on 04/05/2018. Patient is able to perform all ADLs with some pain and discomfort. Patient reports he mainly has to remember not to over-do it. Patient reports his wife reminds him of his precautions and he is unsure of them himself. Patient reports pain at worst as 5/10 and pain at best as 0/10 with rest and prescription medication. Patient brings his crutches out in the community but only uses them when he feels like he needs them. Patient's goals are to decrease pain, improve movement, improve strength, and return to performing home and work activities.    Pertinent History  HTN, asthma, right knee scope 07/24/2018    Currently in Pain?  Yes     Pain Score  3     Pain Location  Knee    Pain Orientation  Right    Pain Descriptors / Indicators  Discomfort    Pain Type  Surgical pain    Pain Onset  1 to 4 weeks ago    Pain Frequency  Intermittent    Aggravating Factors   over working it    Pain Relieving Factors  resting it    Effect of Pain on Daily Activities  difficulties with work activities         [facility] PT Assessment - 08/31/18 0001      Assessment   Medical Diagnosis  Encounter for other ortopedic aftercare    Referring Provider (PT)  Eugenia Mcalpineobert Collins, MD    Onset Date/Surgical Date  08/24/18    Next MD Visit  09/08/2018    Prior Therapy  no      Precautions   Precautions  Knee    Type of Shoulder Precautions  right knee    Precaution Comments  Pain free strengthening      Restrictions   Weight Bearing Restrictions  Yes    RLE Weight Bearing  Weight bearing as tolerated      Balance Screen   Has the patient fallen in the past 6 months  Yes  How many times?  multiple    Has the patient had a decrease in activity level because of a fear of falling?   No    Is the patient reluctant to leave their home because of a fear of falling?   No      Home Film/video editor residence    Living Arrangements  Spouse/significant other      Prior Function   Level of Independence  Independent      Observation/Other Assessments   Skin Integrity  3 incision with stitches intact    Focus on Therapeutic Outcomes (FOTO)   51% lmitation      Observation/Other Assessments-Edema    Edema  Circumferential      Circumferential Edema   Circumferential - Right  39 cm at mid patella    Circumferential - Left   37.5 at mid patella      ROM / Strength   AROM / PROM / Strength  AROM;Strength      AROM   AROM Assessment Site  Knee    Right/Left Knee  Right    Right Knee Extension  3    Right Knee Flexion  120      Strength   Overall Strength Comments  grossly assessed    Strength Assessment Site   Knee    Right/Left Knee  Right    Right Knee Flexion  3/5    Right Knee Extension  3/5      Palpation   Patella mobility  WNL    Palpation comment  slight tenderness to palpation to bilateral joint lines      Transfers   Transfers  Independent with all Transfers      Ambulation/Gait   Assistive device  --   bilateral crutches but holding them not using   Gait Pattern  Step-through pattern;Decreased stance time - right;Decreased step length - left;Antalgic                Objective measurements completed on examination: See above findings.              PT Education - 08/31/18 1311    Education Details  [employer], importance of not overworking,    Person(s) Educated  Patient    Methods  Explanation;Demonstration;Handout    Comprehension  Verbalized understanding;Returned demonstration       PT Short Term Goals - 08/31/18 1342      PT SHORT TERM GOAL #1   Title  STG=LTG        PT Long Term Goals - 08/31/18 1322      PT LONG TERM GOAL #1   Title  Patient will be independent with HEP    Time  4    Period  Weeks    Status  New      PT LONG TERM GOAL #2   Title  Patient will report ability to ambulate community distances with right knee pain less than or equal to 3/10    Time  4    Period  Weeks    Status  New      PT LONG TERM GOAL #3   Title  Patient will demonstrate 4+/5 right knee MMT to improve stability during functional tasks.    Time  6    Period  Weeks    Status  New      PT LONG TERM GOAL #4   Title  Patient report ability to negotiate steps and a  ladder with reciprocating pattern with right knee pain less than or equal to 2/10.    Time  4    Period  Weeks    Status  New             Plan - 08/31/18 1337    Clinical Impression Statement  Patient is 58 year old male who presents to physical therapy with right knee pain and increased right knee edema secondary to a right knee scope on 08/24/2018. Patient's strength was grossly  assessed. Patient noted with 3 incision with the stitches intact. Patient's right knee flexion AROM is WFL. Patient arrives with crutches but did not use them to ambulate stating his MD stated to "put as much weight on his leg as he can tolerate." Patient ambulates with decreased right stance time, decreased left step length and slight antalgic gait pattern. Patient and PT discussed the importance of not overdoing it to allow knee to heal. Patient reported understanding. Patient would benefit from skilled physical therapy to address deficits and address patient's goals.    Personal Factors and Comorbidities  Age;Comorbidity 2    Comorbidities  HTN, Asthma, R knee scope 08/24/2018    Examination-Activity Limitations  Squat;Stairs;Stand    Examination-Participation Restrictions  Yard Work    Stability/Clinical Decision Making  Stable/Uncomplicated    Clinical Decision Making  Low    Rehab Potential  Excellent    PT Frequency  3x / week    PT Duration  4 weeks    PT Treatment/Interventions  ADLs/Self Care Home Management;Electrical Stimulation;Cryotherapy;Neuromuscular re-education;Therapeutic exercise;Manual techniques;Scar mobilization;Passive range of motion;Vasopneumatic Device;Patient/family education;Moist Heat;Gait training;Stair training;Functional mobility training;Therapeutic activities;Balance training;Taping    PT Next Visit Plan  Nustep, pain free strengthening per tolerance, modalities PRN for pain relief.    PT Home Exercise Plan  see patient education section    Consulted and Agree with Plan of Care  Patient       Patient will benefit from skilled therapeutic intervention in order to improve the following deficits and impairments:  Decreased range of motion, Pain, Decreased strength, Decreased balance, Difficulty walking, Increased edema  Visit Diagnosis: Acute pain of right knee     Problem List There are no active problems to display for this patient.   Guss BundeKrystle Meridian Scherger,  PT, DPT 08/31/2018, 1:42 PM  [facility] [address] [place], [place], [address] Phone: [phone]   Fax:  [phone]  Name: Scott CockayneJames M Chapman MRN: [id number] Date of Birth: [birth year]

## 2018-09-01 ENCOUNTER — Ambulatory Visit: Payer: BC Managed Care – PPO | Admitting: *Deleted

## 2018-09-01 DIAGNOSIS — M25561 Pain in right knee: Secondary | ICD-10-CM

## 2018-09-01 NOTE — Therapy (Signed)
[facility] [address] [place], [place], [address] Phone: [phone]   Fax:  [phone]  Physical Therapy Treatment  Patient Details  Name: Scott CockayneJames M Chapman MRN: [id number] Date of Birth: May 22, 1960 Referring Provider (PT): Eugenia Mcalpineobert Collins, MD   Encounter Date: 09/01/2018  PT End of Session - 09/01/18 1122    Visit Number  2    Number of Visits  12    Date for PT Re-Evaluation  10/05/18    Authorization Type  FOTO; Progress note every 10th visit    PT Start Time  1030    PT Stop Time  1123    PT Time Calculation (min)  53 min       Past Medical History:  Diagnosis Date  . Allergy   . Hyperlipidemia   . Hypertension     Past Surgical History:  Procedure Laterality Date  . Colapsed lung Right   . HERNIA REPAIR    . left arm surgery    . TENDON REPAIR Right    arm    There were no vitals filed for this visit.  Subjective Assessment - 09/01/18 1233    Subjective  COVID-19 screening performed upon arrival.   Knee scope 08-24-18. Doing ok    Pertinent History  HTN, asthma, right knee scope 08/24/2018    Currently in Pain?  Yes    Pain Score  3     Pain Location  Knee    Pain Orientation  Right    Pain Descriptors / Indicators  Discomfort    Pain Type  Surgical pain    Pain Onset  1 to 4 weeks ago    Pain Frequency  Intermittent                       [facility] Adult PT Treatment/Exercise - 09/01/18 0001      Exercises   Exercises  Knee/Hip      Knee/Hip Exercises: Aerobic   Stationary Bike  L1 x 10 mins      Knee/Hip Exercises: Seated   Long Arc Quad  Strengthening;Right;3 sets;10 reps    Long Arc Quad Weight  2 lbs.      Knee/Hip Exercises: Supine   Quad Sets  AROM;Right;1 set;10 reps    Short Arc [employer]Quad Sets  Strengthening;Right;3 sets;10 reps    Short Arc [employer]Quad Sets Limitations  2#    Bridges  AROM;3 sets;10 reps    Straight Leg Raises  AROM;3 sets;10 reps   Hip abduction 3 x 10     Modalities    Modalities  Electrical Stimulation;Vasopneumatic      Electrical Stimulation   Electrical Stimulation Location  RT knee IFC x 15 mins 80-150hz     Electrical Stimulation Goals  Pain      Vasopneumatic   Number Minutes Vasopneumatic   15 minutes    Vasopnuematic Location   Knee    Vasopneumatic Pressure  Low    Vasopneumatic Temperature   36               PT Short Term Goals - 08/31/18 1342      PT SHORT TERM GOAL #1   Title  STG=LTG        PT Long Term Goals - 08/31/18 1322      PT LONG TERM GOAL #1   Title  Patient will be independent with HEP    Time  4    Period  Weeks  Status  New      PT LONG TERM GOAL #2   Title  Patient will report ability to ambulate community distances with right knee pain less than or equal to 3/10    Time  4    Period  Weeks    Status  New      PT LONG TERM GOAL #3   Title  Patient will demonstrate 4+/5 right knee MMT to improve stability during functional tasks.    Time  6    Period  Weeks    Status  New      PT LONG TERM GOAL #4   Title  Patient report ability to negotiate steps and a ladder with reciprocating pattern with right knee pain less than or equal to 2/10.    Time  4    Period  Weeks    Status  New            Plan - 09/01/18 1114    Clinical Impression Statement  Pt arrived today doing fairly well with minimal pain. He was able to perform therex without increased pain/discomfort. Pt was issued HEP with instruction sheet. Normal modality response today.    Comorbidities  HTN, Asthma, R knee scope 08/24/2018    Examination-Activity Limitations  Squat;Stairs;Stand    Examination-Participation Restrictions  Yard Work    Stability/Clinical Decision Making  Stable/Uncomplicated    Rehab Potential  Excellent    Clinical Impairments Affecting Rehab Potential  R biceps tendon repair 2015 current surgery 10/14/15     PT Frequency  3x / week    PT Duration  4 weeks    PT Treatment/Interventions  ADLs/Self Care  Home Management;Electrical Stimulation;Cryotherapy;Neuromuscular re-education;Therapeutic exercise;Manual techniques;Scar mobilization;Passive range of motion;Vasopneumatic Device;Patient/family education;Moist Heat;Gait training;Stair training;Functional mobility training;Therapeutic activities;Balance training;Taping    PT Next Visit Plan  Nustep, pain free strengthening per tolerance, modalities PRN for pain relief.    PT Home Exercise Plan  see patient education section    Consulted and Agree with Plan of Care  Patient       Patient will benefit from skilled therapeutic intervention in order to improve the following deficits and impairments:  Decreased range of motion, Pain, Decreased strength, Decreased balance, Difficulty walking, Increased edema  Visit Diagnosis: Acute pain of right knee     Problem List There are no active problems to display for this patient.   Scott Chapman,CHRIS , PTA 09/01/2018, 12:38 PM  [facility] [address] [place], [place], [address] Phone: [phone]   Fax:  [phone]  Name: Scott Chapman MRN: [id number] Date of Birth: 11/13/1960

## 2018-09-05 ENCOUNTER — Ambulatory Visit: Payer: BC Managed Care – PPO | Admitting: *Deleted

## 2018-09-05 ENCOUNTER — Other Ambulatory Visit: Payer: Self-pay

## 2018-09-05 DIAGNOSIS — M25561 Pain in right knee: Secondary | ICD-10-CM | POA: Diagnosis not present

## 2018-09-05 NOTE — Therapy (Signed)
[facility], [place], [address] Phone: [phone]   Fax:  [phone]  Physical Therapy Treatment  Patient Details  Name: Scott [place] MRN: [id number] Date of Birth: 1960-10-10 Referring Provider (PT): Sydnee Cabal, MD   Encounter Date: 09/05/2018  PT End of Session - 09/05/18 1123    Visit Number  3    Number of Visits  12    Date for PT Re-Evaluation  10/05/18    PT Start Time  1115    PT Stop Time  1209    PT Time Calculation (min)  54 min       Past Medical History:  Diagnosis Date  . Allergy   . Hyperlipidemia   . Hypertension     Past Surgical History:  Procedure Laterality Date  . Colapsed lung Right   . HERNIA REPAIR    . left arm surgery    . TENDON REPAIR Right    arm    There were no vitals filed for this visit.  Subjective Assessment - 09/05/18 1121    Subjective  COVID-19 screening performed upon arrival.   Knee scope 08-24-18. Doing ok today    Pertinent History  HTN, asthma, right knee scope 08/24/2018    Currently in Pain?  Yes    Pain Score  1     Pain Location  Knee    Pain Orientation  Right    Pain Descriptors / Indicators  Discomfort    Pain Type  Surgical pain    Pain Onset  1 to 4 weeks ago                       [facility] Adult PT Treatment/Exercise - 09/05/18 0001      Exercises   Exercises  Knee/Hip      Knee/Hip Exercises: Aerobic   Stationary Bike  L1 x 10 mins      Knee/Hip Exercises: Seated   Long Arc Quad  Strengthening;Right;3 sets;10 reps    Long Arc Quad Weight  2 lbs.      Knee/Hip Exercises: Supine   Quad Sets  AROM;Right;1 set;10 reps    Short Arc [employer]  Strengthening;Right;3 sets;10 reps    Short Arc [employer] Limitations  2#    Hip Adduction Isometric  Both;10 reps   hold 5 secs   Bridges  AROM;3 sets;10 reps    Straight Leg Raises  AROM;3 sets;10 reps   Hip abduction 3 x 10     Modalities   Modalities  Electrical  Stimulation;Vasopneumatic      Electrical Stimulation   Electrical Stimulation Location  RT knee IFC x 15 mins 80-150hz     Electrical Stimulation Goals  Pain      Vasopneumatic   Number Minutes Vasopneumatic   15 minutes    Vasopnuematic Location   Knee    Vasopneumatic Pressure  Low    Vasopneumatic Temperature   36               PT Short Term Goals - 08/31/18 1342      PT SHORT TERM GOAL #1   Title  STG=LTG        PT Long Term Goals - 08/31/18 1322      PT LONG TERM GOAL #1   Title  Patient will be independent with HEP    Time  4    Period  Weeks    Status  New  PT LONG TERM GOAL #2   Title  Patient will report ability to ambulate community distances with right knee pain less than or equal to 3/10    Time  4    Period  Weeks    Status  New      PT LONG TERM GOAL #3   Title  Patient will demonstrate 4+/5 right knee MMT to improve stability during functional tasks.    Time  6    Period  Weeks    Status  New      PT LONG TERM GOAL #4   Title  Patient report ability to negotiate steps and a ladder with reciprocating pattern with right knee pain less than or equal to 2/10.    Time  4    Period  Weeks    Status  New            Plan - 09/05/18 1124    Clinical Impression Statement  Pt arrived today doing fairly well with minimal pain still in RT knee. He was able to perform all therex without complaints except soreness. His ROM continues to do good and has mild swelling now. Normal modality response today    Personal Factors and Comorbidities  Age;Comorbidity 2    Comorbidities  HTN, Asthma, R knee scope 08/24/2018    Examination-Activity Limitations  Squat;Stairs;Stand    Examination-Participation Restrictions  Yard Work    Rehab Potential  Excellent    Clinical Impairments Affecting Rehab Potential  R biceps tendon repair 2015 current surgery 10/14/15     PT Frequency  3x / week    PT Duration  4 weeks    PT Treatment/Interventions  ADLs/Self  Care Home Management;Electrical Stimulation;Cryotherapy;Neuromuscular re-education;Therapeutic exercise;Manual techniques;Scar mobilization;Passive range of motion;Vasopneumatic Device;Patient/family education;Moist Heat;Gait training;Stair training;Functional mobility training;Therapeutic activities;Balance training;Taping    PT Next Visit Plan  Nustep, pain free strengthening per tolerance, modalities PRN for pain relief.    PT Home Exercise Plan  see patient education section    Consulted and Agree with Plan of Care  Patient       Patient will benefit from skilled therapeutic intervention in order to improve the following deficits and impairments:  Decreased range of motion, Pain, Decreased strength, Decreased balance, Difficulty walking, Increased edema  Visit Diagnosis: Acute pain of right knee     Problem List There are no active problems to display for this patient.   Scott [place],Scott [place], PTA 09/05/2018, 2:13 PM  [facility] [address] [place], [place], [address] Phone: [phone]   Fax:  [phone]  Name: Scott CockayneJames M [place] MRN: [id number] Date of Birth: [birth year]

## 2018-09-13 ENCOUNTER — Ambulatory Visit: Payer: BC Managed Care – PPO | Attending: Specialist | Admitting: Physical Therapy

## 2018-09-13 ENCOUNTER — Other Ambulatory Visit: Payer: Self-pay

## 2018-09-13 DIAGNOSIS — M25561 Pain in right knee: Secondary | ICD-10-CM | POA: Diagnosis present

## 2018-09-13 NOTE — Therapy (Signed)
[facility] [address] [place], [place], [address] Phone: [phone]   Fax:  [phone]  Physical Therapy Treatment  Patient Details  Name: Scott [place] MRN: [id number] Date of Birth: 04-26-1960 Referring Provider (PT): Eugenia Mcalpineobert Collins, MD   Encounter Date: 09/13/2018  PT End of Session - 09/13/18 1321    Visit Number  4    Number of Visits  12    Date for PT Re-Evaluation  10/05/18    Authorization Type  FOTO; Progress note every 10th visit    PT Start Time  0100    PT Stop Time  0157    PT Time Calculation (min)  57 min    Activity Tolerance  Patient tolerated treatment well    Behavior During Therapy  [facility] assessed/performed       Past Medical History:  Diagnosis Date  . Allergy   . Hyperlipidemia   . Hypertension     Past Surgical History:  Procedure Laterality Date  . Colapsed lung Right   . HERNIA REPAIR    . left arm surgery    . TENDON REPAIR Right    arm    There were no vitals filed for this visit.  Subjective Assessment - 09/13/18 1303    Subjective  COVID-19 screening performed upon arrival.   Patient reported some weakness and soreness    Pertinent History  HTN, asthma, right knee scope 08/24/2018    Currently in Pain?  Yes    Pain Score  1     Pain Location  Knee    Pain Orientation  Right    Pain Descriptors / Indicators  Discomfort    Pain Type  Surgical pain    Pain Onset  1 to 4 weeks ago    Pain Frequency  Intermittent    Aggravating Factors   prolong activity    Pain Relieving Factors  at rest                       [facility] - 09/13/18 0001      Knee/Hip Exercises: Aerobic   Stationary Bike  L1 x 10 mins      Knee/Hip Exercises: Seated   Long Arc Quad  Strengthening;Right;3 sets;10 reps    Long Arc Quad Weight  2 lbs.      Knee/Hip Exercises: Supine   Quad Sets  AROM;Right;20 reps;1 set    Short Arc [employer]Quad Sets  Strengthening;Right;3  sets;10 reps   with ball squeeze for VMO activation   Short Arc Quad Sets Limitations  2#    Hip Adduction Isometric  Strengthening   x30   Bridges  AROM;3 sets;10 reps    Straight Leg Raises  AROM;3 sets;10 reps    Other Supine Knee/Hip Exercises  hip abd with red t-band x30      Electrical Stimulation   Electrical Stimulation Location  RT knee IFC x 15 mins 80-150hz     Electrical Stimulation Goals  Pain      Vasopneumatic   Number Minutes Vasopneumatic   15 minutes    Vasopnuematic Location   Knee    Vasopneumatic Pressure  Low               PT Short Term Goals - 08/31/18 1342      PT SHORT TERM GOAL #1   Title  STG=LTG        PT Long Term Goals - 09/13/18 1305  PT LONG TERM GOAL #1   Title  Patient will be independent with HEP    Time  4    Period  Weeks    Status  On-going      PT LONG TERM GOAL #2   Title  Patient will report ability to ambulate community distances with right knee pain less than or equal to 3/10    Time  4    Period  Weeks    Status  On-going      PT LONG TERM GOAL #3   Title  Patient will demonstrate 4+/5 right knee MMT to improve stability during functional tasks.    Time  6    Period  Weeks    Status  On-going      PT LONG TERM GOAL #4   Title  Patient report ability to negotiate steps and a ladder with reciprocating pattern with right knee pain less than or equal to 2/10.    Time  4    Period  Weeks    Status  On-going            Plan - 09/13/18 1327    Clinical Impression Statement  Patient tolerated treatment well today. Patient has some c/o weakness in knee. Today focused on exercises to strengthen right knee pain free focus. Patient feels like he is able to perform ADL's with greater ease yet at times "knee locks " when transition from sit to stand. Goals ongoing at this time.    Personal Factors and Comorbidities  Age;Comorbidity 2    Comorbidities  HTN, Asthma, R knee scope 08/24/2018    Examination-Activity  Limitations  Squat;Stairs;Stand    Examination-Participation Restrictions  Yard Work    Stability/Clinical Decision Making  Stable/Uncomplicated    Rehab Potential  Excellent    Clinical Impairments Affecting Rehab Potential  R biceps tendon repair 2015 current surgery 10/14/15     PT Frequency  3x / week    PT Duration  4 weeks    PT Treatment/Interventions  ADLs/Self Care Home Management;Electrical Stimulation;Cryotherapy;Neuromuscular re-education;Therapeutic exercise;Manual techniques;Scar mobilization;Passive range of motion;Vasopneumatic Device;Patient/family education;Moist Heat;Gait training;Stair training;Functional mobility training;Therapeutic activities;Balance training;Taping    PT Next Visit Plan  Nustep, pain free strengthening per tolerance, modalities PRN for pain relief.    Consulted and Agree with Plan of Care  Patient       Patient will benefit from skilled therapeutic intervention in order to improve the following deficits and impairments:  Decreased range of motion, Pain, Decreased strength, Decreased balance, Difficulty walking, Increased edema  Visit Diagnosis: Acute pain of right knee     Problem List There are no active problems to display for this patient.   Phillips Climes, PTA 09/13/2018, 2:06 PM  [facility] [place], [place], [address] Phone: [phone]   Fax:  [phone]  Name: Scott [place] MRN: [id number] Date of Birth: 19-Oct-1960

## 2018-09-20 ENCOUNTER — Other Ambulatory Visit: Payer: Self-pay

## 2018-09-20 ENCOUNTER — Ambulatory Visit: Payer: BC Managed Care – PPO | Admitting: Physical Therapy

## 2018-09-20 ENCOUNTER — Encounter: Payer: Self-pay | Admitting: Physical Therapy

## 2018-09-20 DIAGNOSIS — M25561 Pain in right knee: Secondary | ICD-10-CM | POA: Diagnosis not present

## 2018-09-20 NOTE — Therapy (Signed)
[facility] [address] [place], [place], [address] Phone: [phone]   Fax:  [phone]  Physical Therapy Treatment  Patient Details  Name: Scott [place] MRN: [id number] Date of Birth: 01/09/1961 Referring Provider (PT): Robert Collins, MD   Encounter Date: 09/20/2018  PT End of Session - 09/20/18 1308    Visit Number  5    Number of Visits  12    Date for PT Re-Evaluation  10/05/18    Authorization Type  FOTO; Progress note every 10th visit    PT Start Time  1300    PT Stop Time  1344    PT Time Calculation (min)  44 min    Activity Tolerance  Patient tolerated treatment well    Behavior During Therapy  WFL for tasks assessed/performed       Past Medical History:  Diagnosis Date  . Allergy   . Hyperlipidemia   . Hypertension     Past Surgical History:  Procedure Laterality Date  . Colapsed lung Right   . HERNIA REPAIR    . left arm surgery    . TENDON REPAIR Right    arm    There were no vitals filed for this visit.  Subjective Assessment - 09/20/18 1307    Subjective  COVID-19 screening performed upon arrival. No complaints reported.    Pertinent History  HTN, asthma, right knee scope 08/24/2018    Currently in Pain?  No/denies         [facility] PT Assessment - 09/20/18 0001      Assessment   Medical Diagnosis  Encounter for other ortopedic aftercare    Referring Provider (PT)  Robert Collins, MD    Onset Date/Surgical Date  08/24/18    Hand Dominance  Right    Next MD Visit  10/09/2018    Prior Therapy  no      Precautions   Precautions  Knee    Type of Shoulder Precautions  right knee    Precaution Comments  Pain free strengthening                   [facility] Adult PT Treatment/Exercise - 09/20/18 0001      Knee/Hip Exercises: Aerobic   Stationary Bike  L1 x 10 mins      Knee/Hip Exercises: Seated   Long Arc Quad  Strengthening;Right;3 sets;10 reps    Long Arc Quad Weight  3 lbs.       Knee/Hip Exercises: Supine   Straight Leg Raises  AROM;3 sets;10 reps    Straight Leg Raise with External Rotation  AROM;Right;2 sets;10 reps    Other Supine Knee/Hip Exercises  B clam green theraband x20 reps      Modalities   Modalities  Vasopneumatic      Vasopneumatic   Number Minutes Vasopneumatic   15 minutes    Vasopnuematic Location   Knee    Vasopneumatic Pressure  Low    Vasopneumatic Temperature   34               PT Short Term Goals - 08/31/18 1342      PT SHORT TERM GOAL #1   Title  STG=LTG        PT Long Term Goals - 09/20/18 1333      PT LONG TERM GOAL #1   Title  Patient will be independent with HEP    Time  4    Period  Weeks    Status    Achieved      PT LONG TERM GOAL #2   Title  Patient will report ability to ambulate community distances with right knee pain less than or equal to 3/10    Time  4    Period  Weeks    Status  Achieved      PT LONG TERM GOAL #3   Title  Patient will demonstrate 4+/5 right knee MMT to improve stability during functional tasks.    Time  6    Period  Weeks    Status  On-going      PT LONG TERM GOAL #4   Title  Patient report ability to negotiate steps and a ladder with reciprocating pattern with right knee pain less than or equal to 2/10.    Time  4    Period  Weeks    Status  Partially Met   Depending on activity level 09/20/2018           Plan - 09/20/18 1336    Clinical Impression Statement  Patient presented in clinic with no R knee pain upon arrival. Had no complaints today but has rested predominately today. Patient able to complete quad focused therex with only reports of muscle fatigue especially with SLRs. Patient experiences discomfort primarily at the end of the day but greatly depends on activity level of the day. Normal vasopneumatic response noted following removal of the modality.    Personal Factors and Comorbidities  Age;Comorbidity 2    Comorbidities  HTN, Asthma, R knee scope 08/24/2018     Examination-Activity Limitations  Squat;Stairs;Stand    Examination-Participation Restrictions  Yard Work    Stability/Clinical Decision Making  Stable/Uncomplicated    Rehab Potential  Excellent    Clinical Impairments Affecting Rehab Potential  R biceps tendon repair 2015 current surgery 10/14/15     PT Frequency  3x / week    PT Duration  4 weeks    PT Treatment/Interventions  ADLs/Self Care Home Management;Electrical Stimulation;Cryotherapy;Neuromuscular re-education;Therapeutic exercise;Manual techniques;Scar mobilization;Passive range of motion;Vasopneumatic Device;Patient/family education;Moist Heat;Gait training;Stair training;Functional mobility training;Therapeutic activities;Balance training;Taping    PT Next Visit Plan  Nustep, pain free strengthening per tolerance, modalities PRN for pain relief.    PT Home Exercise Plan  see patient education section    Consulted and Agree with Plan of Care  Patient       Patient will benefit from skilled therapeutic intervention in order to improve the following deficits and impairments:  Decreased range of motion, Pain, Decreased strength, Decreased balance, Difficulty walking, Increased edema  Visit Diagnosis: Acute pain of right knee     Problem List There are no active problems to display for this patient.   Kelsey P Kennon, PTA 09/20/2018, 2:00 PM  [facility] [address] [place], [place], [address] Phone: [phone]   Fax:  [phone]  Name: Scott [place] MRN: [id number] Date of Birth: 09/24/1960   

## 2018-09-27 ENCOUNTER — Other Ambulatory Visit: Payer: Self-pay

## 2018-09-27 ENCOUNTER — Ambulatory Visit: Payer: BC Managed Care – PPO | Admitting: Physical Therapy

## 2018-09-27 ENCOUNTER — Encounter: Payer: Self-pay | Admitting: Physical Therapy

## 2018-09-27 DIAGNOSIS — M25561 Pain in right knee: Secondary | ICD-10-CM | POA: Diagnosis not present

## 2018-09-27 NOTE — Therapy (Signed)
[facility] [place], [place], [address] Phone: [phone]   Fax:  [phone]  Physical Therapy Treatment  Patient Details  Name: ANIK WESCH MRN: [id number] Date of Birth: [birth year] Referring Provider (PT): Sydnee Cabal, MD   Encounter Date: 09/27/2018  PT End of Session - 09/27/18 1324    Visit Number  6    Number of Visits  12    Date for PT Re-Evaluation  10/05/18    Authorization Type  FOTO; Progress note every 10th visit    PT Start Time  1300    PT Stop Time  1352    PT Time Calculation (min)  52 min    Activity Tolerance  Patient tolerated treatment well    Behavior During Therapy  [facility] for tasks assessed/performed       Past Medical History:  Diagnosis Date  . Allergy   . Hyperlipidemia   . Hypertension     Past Surgical History:  Procedure Laterality Date  . Colapsed lung Right   . HERNIA REPAIR    . left arm surgery    . TENDON REPAIR Right    arm    There were no vitals filed for this visit.  Subjective Assessment - 09/27/18 1323    Subjective  COVID-19 screening performed upon arrival. Patient [place] it out at the beach and reported having an increase of swelling and tension to right medial hamstring.    Pertinent History  HTN, asthma, right knee scope 08/24/2018    Currently in Pain?  Yes    Pain Score  4     Pain Location  Knee    Pain Orientation  Right    Pain Descriptors / Indicators  Discomfort;Sore    Pain Type  Surgical pain    Pain Onset  1 to 4 weeks ago    Pain Frequency  Intermittent         [facility] PT Assessment - 09/27/18 0001      Assessment   Medical Diagnosis  Encounter for other ortopedic aftercare    Referring Provider (PT)  Sydnee Cabal, MD    Onset Date/Surgical Date  08/24/18    Hand Dominance  Right    Next MD Visit  10/09/2018    Prior Therapy  no      Precautions   Precautions  Knee    Type of Shoulder Precautions  right knee    Precaution  Comments  Pain free strengthening      Observation/Other Assessments   Focus on Therapeutic Outcomes (FOTO)   41% limitation                   [facility] Adult PT Treatment/Exercise - 09/27/18 0001      Knee/Hip Exercises: Aerobic   Stationary Bike  L1 x 10 mins      Knee/Hip Exercises: Standing   Functional Squat  2 sets;10 reps    Rocker Board  3 minutes      Knee/Hip Exercises: Seated   Clamshell with TheraBand  Red   x20   Hamstring Curl  Strengthening;Right;2 sets      Knee/Hip Exercises: Supine   Short Arc Quad Sets  Strengthening;Right;3 sets;10 reps    Short Arc Quad Sets Limitations  3#    Bridges  AROM;2 sets;10 reps    Straight Leg Raise with External Rotation  AROM;Right;2 sets;10 reps      Acupuncturist Location  right knee  Electrical Stimulation Action  IFC    Electrical Stimulation Parameters  80-150 hz x15 mins    Electrical Stimulation Goals  Pain      Vasopneumatic   Number Minutes Vasopneumatic   15 minutes    Vasopnuematic Location   Knee    Vasopneumatic Pressure  Low    Vasopneumatic Temperature   34               PT Short Term Goals - 08/31/18 1342      PT SHORT TERM GOAL #1   Title  STG=LTG        PT Long Term Goals - 09/20/18 1333      PT LONG TERM GOAL #1   Title  Patient will be independent with HEP    Time  4    Period  Weeks    Status  Achieved      PT LONG TERM GOAL #2   Title  Patient will report ability to ambulate community distances with right knee pain less than or equal to 3/10    Time  4    Period  Weeks    Status  Achieved      PT LONG TERM GOAL #3   Title  Patient will demonstrate 4+/5 right knee MMT to improve stability during functional tasks.    Time  6    Period  Weeks    Status  On-going      PT LONG TERM GOAL #4   Title  Patient report ability to negotiate steps and a ladder with reciprocating pattern with right knee pain less than or equal to 2/10.     Time  4    Period  Weeks    Status  Partially Met   Depending on activity level 09/20/2018           Plan - 09/27/18 1736    Clinical Impression Statement  Patient arrived to physical therapy feeling good but concerned about his knee due to increased pain and swelling while on vacation. Upon assessment, patient's medial hamstring noted with increased tone but no pain upon palpation. Patient educated hamstring may be sore due to increased activity levels with walking, swimming and being in the ocean. Patient reported understanding. Patient was able to tolerate treatment well with minimal reports of increased pain. No adverse affects noted upon removal of modalities.    Personal Factors and Comorbidities  Age;Comorbidity 2    Comorbidities  HTN, Asthma, R knee scope 08/24/2018    Examination-Activity Limitations  Squat;Stairs;Stand    Stability/Clinical Decision Making  Stable/Uncomplicated    Clinical Decision Making  Low    Rehab Potential  Excellent    PT Frequency  3x / week    PT Treatment/Interventions  ADLs/Self Care Home Management;Electrical Stimulation;Cryotherapy;Neuromuscular re-education;Therapeutic exercise;Manual techniques;Scar mobilization;Passive range of motion;Vasopneumatic Device;Patient/family education;Moist Heat;Gait training;Stair training;Functional mobility training;Therapeutic activities;Balance training;Taping    PT Next Visit Plan  Continue POC of Nustep, pain free strengthening per tolerance, modalities PRN for pain relief.    PT Home Exercise Plan  see patient education section    Consulted and Agree with Plan of Care  Patient       Patient will benefit from skilled therapeutic intervention in order to improve the following deficits and impairments:  Decreased range of motion, Pain, Decreased strength, Decreased balance, Difficulty walking, Increased edema  Visit Diagnosis: Acute pain of right knee     Problem List There are no active problems to  display for this  patient.   Gabriela Eves, PT, DPT 09/27/2018, 5:42 PM  [facility] [place], [place], [address] Phone: [phone]   Fax:  [phone]  Name: DILLAN CANDELA MRN: [id number] Date of Birth: September 13, 1960

## 2018-10-04 ENCOUNTER — Other Ambulatory Visit: Payer: Self-pay

## 2018-10-04 ENCOUNTER — Ambulatory Visit: Payer: BC Managed Care – PPO | Admitting: Physical Therapy

## 2018-10-04 ENCOUNTER — Encounter: Payer: Self-pay | Admitting: Physical Therapy

## 2018-10-04 DIAGNOSIS — M25561 Pain in right knee: Secondary | ICD-10-CM

## 2018-10-04 NOTE — Therapy (Addendum)
[facility] [place], [place], [address] Phone: [phone]   Fax:  [phone]  Physical Therapy Treatment PHYSICAL THERAPY DISCHARGE SUMMARY  Visits from Start of Care: 7  Current functional level related to goals / functional outcomes: See below   Remaining deficits: See goals   Education / Equipment: HEP Plan: Patient agrees to discharge.  Patient goals were partially met. Patient is being discharged due to not returning since the last visit.  ?????  Gabriela Eves, PT, DPT   Patient Details  Name: Scott Chapman MRN: [id number] Date of Birth: 01-May-1960 Referring Provider (PT): Sydnee Cabal, MD   Encounter Date: 10/04/2018  PT End of Session - 10/04/18 1339    Visit Number  7    Number of Visits  12    Date for PT Re-Evaluation  10/05/18    Authorization Type  FOTO; Progress note every 10th visit    PT Start Time  1259    PT Stop Time  1349    PT Time Calculation (min)  50 min    Activity Tolerance  Patient tolerated treatment well    Behavior During Therapy  [facility] for tasks assessed/performed       Past Medical History:  Diagnosis Date  . Allergy   . Hyperlipidemia   . Hypertension     Past Surgical History:  Procedure Laterality Date  . Colapsed lung Right   . HERNIA REPAIR    . left arm surgery    . TENDON REPAIR Right    arm    There were no vitals filed for this visit.  Subjective Assessment - 10/04/18 1301    Subjective  COVID-19 screening performed upon arrival. Patient reported doing well with knee today    Pertinent History  HTN, asthma, right knee scope 08/24/2018    Currently in Pain?  Yes    Pain Score  3     Pain Location  Knee    Pain Orientation  Right    Pain Descriptors / Indicators  Discomfort    Pain Type  Surgical pain    Pain Onset  More than a month ago    Pain Frequency  Intermittent    Aggravating Factors   prolong activity    Pain Relieving Factors  at rest                        [facility] Adult PT Treatment/Exercise - 10/04/18 0001      Knee/Hip Exercises: Aerobic   Stationary Bike  L1 x109mn      Knee/Hip Exercises: Seated   Sit to Sand  without UE support   2x10     Knee/Hip Exercises: Supine   Short Arc Quad Sets  Strengthening;Right;3 sets;10 reps    Short Arc Quad Sets Limitations  3#    Bridges  AROM;2 sets;10 reps    Straight Leg Raise with External Rotation  AROM;Right;2 sets;10 reps    Other Supine Knee/Hip Exercises  B clam green theraband x20 reps      Electrical Stimulation   Electrical Stimulation Location  right knee    Electrical Stimulation Action  IFC    Electrical Stimulation Parameters  80-'150hz'  x174m    Electrical Stimulation Goals  Pain      Vasopneumatic   Number Minutes Vasopneumatic   15 minutes    Vasopnuematic Location   Knee    Vasopneumatic Pressure  Low    Vasopneumatic Temperature  34               PT Short Term Goals - 08/31/18 1342      PT SHORT TERM GOAL #1   Title  STG=LTG        PT Long Term Goals - 10/04/18 1308      PT LONG TERM GOAL #1   Title  Patient will be independent with HEP    Time  4    Period  Weeks    Status  Achieved      PT LONG TERM GOAL #2   Title  Patient will report ability to ambulate community distances with right knee pain less than or equal to 3/10    Time  4    Period  Weeks    Status  Achieved      PT LONG TERM GOAL #3   Title  Patient will demonstrate 4+/5 right knee MMT to improve stability during functional tasks.    Time  6    Period  Weeks    Status  Achieved   10/04/18     PT LONG TERM GOAL #4   Title  Patient report ability to negotiate steps and a ladder with reciprocating pattern with right knee pain less than or equal to 2/10.    Time  4    Period  Weeks    Status  Partially Met   less than 2/10 pain yet weakness in knee 10/04/18           Plan - 10/04/18 1321    Clinical Impression Statement  Patient  tolerated treatment well today. Patient feels some weakness with prolong activity yet minimal pain overall. Patient has met all but one goal due to weakness with prolong activity. Patient going to MD on monday.    Personal Factors and Comorbidities  Age;Comorbidity 2    Comorbidities  HTN, Asthma, R knee scope 08/24/2018    Examination-Activity Limitations  Squat;Stairs;Stand    Examination-Participation Restrictions  Yard Work    Stability/Clinical Decision Making  Stable/Uncomplicated    Rehab Potential  Excellent    Clinical Impairments Affecting Rehab Potential  R biceps tendon repair 2015 current surgery 10/14/15     PT Frequency  3x / week    PT Duration  4 weeks    PT Treatment/Interventions  ADLs/Self Care Home Management;Electrical Stimulation;Cryotherapy;Neuromuscular re-education;Therapeutic exercise;Manual techniques;Scar mobilization;Passive range of motion;Vasopneumatic Device;Patient/family education;Moist Heat;Gait training;Stair training;Functional mobility training;Therapeutic activities;Balance training;Taping    PT Next Visit Plan  to MD    Consulted and Agree with Plan of Care  Patient       Patient will benefit from skilled therapeutic intervention in order to improve the following deficits and impairments:  Decreased range of motion, Pain, Decreased strength, Decreased balance, Difficulty walking, Increased edema  Visit Diagnosis: Acute pain of right knee     Problem List There are no active problems to display for this patient.   Ladean Raya, PTA 10/04/18 1:52 PM  [facility] [place], [place], [address] Phone: [phone]   Fax:  [phone]  Name: Scott Chapman MRN: [id number] Date of Birth: [birth year]

## 2018-12-06 ENCOUNTER — Other Ambulatory Visit: Payer: Self-pay | Admitting: *Deleted

## 2018-12-06 DIAGNOSIS — Z20822 Contact with and (suspected) exposure to covid-19: Secondary | ICD-10-CM

## 2018-12-08 LAB — NOVEL CORONAVIRUS, NAA: SARS-CoV-2, NAA: DETECTED — AB

## 2018-12-09 ENCOUNTER — Telehealth: Payer: Self-pay | Admitting: Unknown Physician Specialty

## 2018-12-09 NOTE — Telephone Encounter (Signed)
Discussed with patient about Covid symptoms and the use of bamlanivimab, a monoclonal antibody infusion for those with mild to moderate Covid symptoms and at a high risk of hospitalization.  Pt is qualified for this infusion at the [facility] infusion center due to co-morbid conditions and/or a  member of an at-risk group.  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

## 2019-02-26 ENCOUNTER — Encounter: Payer: Self-pay | Admitting: Physical Therapy

## 2019-02-26 ENCOUNTER — Ambulatory Visit: Payer: BC Managed Care – PPO | Attending: Specialist | Admitting: Physical Therapy

## 2019-02-26 ENCOUNTER — Other Ambulatory Visit: Payer: Self-pay

## 2019-02-26 DIAGNOSIS — M25561 Pain in right knee: Secondary | ICD-10-CM | POA: Diagnosis present

## 2019-02-26 DIAGNOSIS — M25562 Pain in left knee: Secondary | ICD-10-CM | POA: Diagnosis not present

## 2019-02-26 NOTE — Therapy (Signed)
[facility] [place], [place], [address] Phone: [phone]   Fax:  [phone]  Physical Therapy Evaluation  Patient Details  Name: Scott Chapman MRN: [id number] Date of Birth: 1960-07-10 Referring Provider (PT): Sydnee Cabal, MD   Encounter Date: 02/26/2019  PT End of Session - 02/26/19 1421    Visit Number  1    Number of Visits  12    Date for PT Re-Evaluation  04/02/19    Authorization Type  FOTO; Progress note every 10th visit    PT Start Time  1346    PT Stop Time  1426    PT Time Calculation (min)  40 min    Activity Tolerance  Patient tolerated treatment well    Behavior During Therapy  [facility] for tasks assessed/performed       Past Medical History:  Diagnosis Date  . Allergy   . Hyperlipidemia   . Hypertension     Past Surgical History:  Procedure Laterality Date  . Colapsed lung Right   . HERNIA REPAIR    . left arm surgery    . TENDON REPAIR Right    arm    There were no vitals filed for this visit.   Subjective Assessment - 02/26/19 1419    Subjective  COVID-19 screening performed upon arrival. Patient arrives with reports of left knee pain and difficulty performing ADLs secondary to a left knee scope on 02/22/2019. Patient reported hearing a pop when stepping down from a ladder in November 2020. Patient reports doing well with ADLs and home activities though with pain. Patient reports pain at worst as 6/10 and pain at best as 3/10. Patient's goals are to decrease pain, improve movement, improve strength, and return to PLOF.    Pertinent History  HTN, asthma, right knee scope 08/24/2018, left knee scop 02/22/2019    Limitations  Sitting;Walking;House hold activities    How long can you stand comfortably?  short periods    How long can you walk comfortably?  within home    Patient Stated Goals  get back to normal    Currently in Pain?  Yes    Pain Score  3     Pain Location  Knee    Pain  Orientation  Right    Pain Descriptors / Indicators  Discomfort    Pain Type  Surgical pain    Pain Onset  In the past 7 days    Pain Frequency  Constant    Aggravating Factors   increased activity and walking    Pain Relieving Factors  resting    Effect of Pain on Daily Activities  difficulties with some ADLs due to pain         [facility] PT Assessment - 02/26/19 0001      Assessment   Medical Diagnosis  Encounter for other orthopedic aftercare, left knee scope    Referring Provider (PT)  Sydnee Cabal, MD    Onset Date/Surgical Date  02/22/19    Next MD Visit  03/07/2019    Prior Therapy  no      Precautions   Precautions  Knee    Type of Shoulder Precautions  left knee    Precaution Comments  Pain free ROM and strengthening      Restrictions   Weight Bearing Restrictions  Yes    LLE Weight Bearing  Weight bearing as tolerated      Balance Screen   Has the patient fallen in the  past 6 months  Yes    How many times?  3    Has the patient had a decrease in activity level because of a fear of falling?   No    Is the patient reluctant to leave their home because of a fear of falling?   No      Home Environment   Living Environment  Private residence    Living Arrangements  Spouse/significant other    Type of Home  House    Home Access  Stairs to enter    Entrance Stairs-Number of Steps  6    Entrance Stairs-Rails  None      Prior Function   Level of Independence  Independent      Observation/Other Assessments   Observations  Ace bandage donned,    Skin Integrity  3 port incisions with stitches intact, minimal redness at incisional ports    Focus on Therapeutic Outcomes (FOTO)   57% limitation      Circumferential Edema   Circumferential - Right  37 cm at mid patella    Circumferential - Left   39.0 cm at mid patella      ROM / Strength   AROM / PROM / Strength  AROM      AROM   AROM Assessment Site  Knee    Right/Left Shoulder  Left    Right/Left Knee  Left     Right Knee Extension  0    Right Knee Flexion  128      Palpation   Patella mobility  WNL    Palpation comment  minimal tenderness upon palpation      Transfers   Transfers  Independent with all Transfers      Ambulation/Gait   Gait Pattern  Step-through pattern;Decreased stride length;Decreased stance time - left                Objective measurements completed on examination: See above findings.      [facility] Adult PT Treatment/Exercise - 02/26/19 0001      Vasopneumatic   Number Minutes Vasopneumatic   15 minutes    Vasopnuematic Location   Knee    Vasopneumatic Pressure  Low    Vasopneumatic Temperature   34             PT Education - 02/26/19 1550    Education Details  Quad sets, hamstring sets, heel slides, importance of finding balance and not working it too much too soon.    Person(s) Educated  Patient    Methods  Explanation;Demonstration;Handout    Comprehension  Verbalized understanding;Returned demonstration       PT Short Term Goals - 08/31/18 1342      PT SHORT TERM GOAL #1   Title  STG=LTG        PT Long Term Goals - 02/26/19 1554      PT LONG TERM GOAL #1   Title  Patient will be independent with HEP and its progression    Time  4    Period  Weeks    Status  New      PT LONG TERM GOAL #2   Title  Patient will demonstrate 130+ degrees of left knee flexion AROM to improve ability to perform functional tasks.    Time  4    Period  Weeks    Status  New      PT LONG TERM GOAL #3   Title  Patient will demonstrate 4+/5 left knee  MMT to improve stability during functional tasks.    Time  4    Period  Weeks    Status  New      PT LONG TERM GOAL #4   Title  Patient report ability to negotiate steps and a ladder with reciprocating pattern with left knee pain less than or equal to 2/10.    Time  4    Period  Weeks    Status  New      PT LONG TERM GOAL #5   Title  Patient will report ability to ambulate community distances with  left knee pain less than or equal to 2/10.    Time  4    Period  Weeks    Status  New             Plan - 02/26/19 1551    Clinical Impression Statement  Patient is a 59 year old male who presents to physical therapy with left knee pain secondary to a left knee scope on 02/22/2019. Patient arrives ambulating without an AD with decreased left stance time and decreased right step length. Patient is able to achieve 0-128 degrees of left knee AROM. MMT not assessed at this time. Patient and PT discussed HEP as well as finding balance between HEP, walking, and preventing overworking the knee to allow for adequate healing. Patient reported understanding. Patient would benefit from skilled physical therapy to address deficits and patient's goals.    Personal Factors and Comorbidities  Age;Comorbidity 2    Comorbidities  HTN, Asthma, R knee scope 08/24/2018, left knee scope 02/22/2019    Examination-Activity Limitations  Squat;Stairs;Stand    Examination-Participation Restrictions  Yard Work    Stability/Clinical Decision Making  Stable/Uncomplicated    Clinical Decision Making  Low    Rehab Potential  Excellent    PT Frequency  3x / week    PT Duration  4 weeks    PT Treatment/Interventions  ADLs/Self Care Home Management;Electrical Stimulation;Cryotherapy;Neuromuscular re-education;Therapeutic exercise;Manual techniques;Scar mobilization;Passive range of motion;Vasopneumatic Device;Patient/family education;Moist Heat;Gait training;Stair training;Functional mobility training;Therapeutic activities;Balance training;Taping    PT Next Visit Plan  Nustep or bike, pain free strengthening and ROM. modalities PRN for pain relief    PT Home Exercise Plan  see patient education section    Consulted and Agree with Plan of Care  Patient       Patient will benefit from skilled therapeutic intervention in order to improve the following deficits and impairments:  Decreased range of motion, Pain, Decreased  strength, Decreased balance, Difficulty walking, Increased edema  Visit Diagnosis: Acute pain of left knee - Plan: PT plan of care cert/re-cert     Problem List There are no problems to display for this patient.   Guss Bunde, PT, DPT 02/26/2019, 3:59 PM  [facility] [address] [place], [place], [address] Phone: [phone]   Fax:  [phone]  Name: Scott Chapman MRN: [id number] Date of Birth: Jul 31, 1960

## 2019-03-01 ENCOUNTER — Ambulatory Visit: Payer: BC Managed Care – PPO | Admitting: Physical Therapy

## 2019-03-06 ENCOUNTER — Ambulatory Visit: Payer: BC Managed Care – PPO | Admitting: Physical Therapy

## 2019-03-06 ENCOUNTER — Other Ambulatory Visit: Payer: Self-pay

## 2019-03-06 DIAGNOSIS — M25561 Pain in right knee: Secondary | ICD-10-CM

## 2019-03-06 DIAGNOSIS — M25562 Pain in left knee: Secondary | ICD-10-CM

## 2019-03-06 NOTE — Therapy (Signed)
[facility] [place], [place], [address] Phone: [phone]   Fax:  [phone]  Physical Therapy Treatment  Patient Details  Name: Scott Chapman MRN: [id number] Date of Birth: 06-27-1960 Referring Provider (PT): Sydnee Cabal, MD   Encounter Date: 03/06/2019  PT End of Session - 03/06/19 1441    Visit Number  2    Number of Visits  12    Date for PT Re-Evaluation  04/02/19    Authorization Type  FOTO; Progress note every 10th visit    PT Start Time  0145    PT Stop Time  0234    PT Time Calculation (min)  49 min    Activity Tolerance  Patient tolerated treatment well    Behavior During Therapy  [facility] for tasks assessed/performed       Past Medical History:  Diagnosis Date  . Allergy   . Hyperlipidemia   . Hypertension     Past Surgical History:  Procedure Laterality Date  . Colapsed lung Right   . HERNIA REPAIR    . left arm surgery    . TENDON REPAIR Right    arm    There were no vitals filed for this visit.  Subjective Assessment - 03/06/19 1425    Subjective  COVID-19 screen performed prior to patient entering clinic.  No new complaints.    Pertinent History  HTN, asthma, right knee scope 08/24/2018, left knee scop 02/22/2019    Limitations  Sitting;Walking;House hold activities    How long can you stand comfortably?  short periods    How long can you walk comfortably?  within home    Patient Stated Goals  get back to normal    Currently in Pain?  Yes    Pain Score  3     Pain Location  Knee    Pain Orientation  Right    Pain Descriptors / Indicators  Discomfort    Pain Type  Surgical pain    Pain Onset  In the past 7 days                       [facility] Adult PT Treatment/Exercise - 03/06/19 0001      Exercises   Exercises  Knee/Hip      Knee/Hip Exercises: Aerobic   Nustep  Level 2 x 20 minutes.      Knee/Hip Exercises: Supine   Short Arc Quad Sets Limitations  3# SAQ's x 3  minutes (left knee).      Modalities   Modalities  Vasopneumatic      Vasopneumatic   Number Minutes Vasopneumatic   20 minutes    Vasopnuematic Location   --   Left knee.   Vasopneumatic Pressure  Low                  PT Long Term Goals - 02/26/19 1554      PT LONG TERM GOAL #1   Title  Patient will be independent with HEP and its progression    Time  4    Period  Weeks    Status  New      PT LONG TERM GOAL #2   Title  Patient will demonstrate 130+ degrees of left knee flexion AROM to improve ability to perform functional tasks.    Time  4    Period  Weeks    Status  New      PT LONG TERM GOAL #  3   Title  Patient will demonstrate 4+/5 left knee MMT to improve stability during functional tasks.    Time  4    Period  Weeks    Status  New      PT LONG TERM GOAL #4   Title  Patient report ability to negotiate steps and a ladder with reciprocating pattern with left knee pain less than or equal to 2/10.    Time  4    Period  Weeks    Status  New      PT LONG TERM GOAL #5   Title  Patient will report ability to ambulate community distances with left knee pain less than or equal to 2/10.    Time  4    Period  Weeks    Status  New            Plan - 03/06/19 1433    Clinical Impression Statement  Patient did great today with no pain with SAQ's and Nustep today.    Personal Factors and Comorbidities  Age;Comorbidity 2    Comorbidities  HTN, Asthma, R knee scope 08/24/2018, left knee scope 02/22/2019    Examination-Activity Limitations  Squat;Stairs;Stand    Examination-Participation Restrictions  Yard Work    Stability/Clinical Decision Making  Stable/Uncomplicated    Rehab Potential  Excellent    PT Frequency  3x / week    PT Duration  4 weeks    PT Treatment/Interventions  ADLs/Self Care Home Management;Electrical Stimulation;Cryotherapy;Neuromuscular re-education;Therapeutic exercise;Manual techniques;Scar mobilization;Passive range of  motion;Vasopneumatic Device;Patient/family education;Moist Heat;Gait training;Stair training;Functional mobility training;Therapeutic activities;Balance training;Taping    PT Next Visit Plan  Nustep or bike, pain free strengthening and ROM. modalities PRN for pain relief    PT Home Exercise Plan  see patient education section    Consulted and Agree with Plan of Care  Patient       Patient will benefit from skilled therapeutic intervention in order to improve the following deficits and impairments:  Decreased range of motion, Pain, Decreased strength, Decreased balance, Difficulty walking, Increased edema  Visit Diagnosis: Acute pain of left knee  Acute pain of right knee     Problem List There are no problems to display for this patient.   Scott Chapman, [place] MPT 03/06/2019, 2:43 PM  [facility] [address] [place], [place], [address] Phone: [phone]   Fax:  [phone]  Name: Scott Chapman MRN: [id number] Date of Birth: 10-Jul-1960

## 2019-03-08 ENCOUNTER — Other Ambulatory Visit: Payer: Self-pay

## 2019-03-08 ENCOUNTER — Encounter: Payer: Self-pay | Admitting: Physical Therapy

## 2019-03-08 ENCOUNTER — Ambulatory Visit: Payer: BC Managed Care – PPO | Admitting: Physical Therapy

## 2019-03-08 DIAGNOSIS — M25562 Pain in left knee: Secondary | ICD-10-CM

## 2019-03-08 NOTE — Therapy (Signed)
[facility] [address] [place], [place], [address] Phone: [phone]   Fax:  [phone]  Physical Therapy Treatment  Patient Details  Name: Scott Chapman MRN: [id number] Date of Birth: 1960/07/23 Referring Provider (PT): Eugenia Mcalpine, MD   Encounter Date: 03/08/2019  PT End of Session - 03/08/19 1724    Visit Number  3    Number of Visits  12    Date for PT Re-Evaluation  04/02/19    Authorization Type  FOTO; Progress note every 10th visit    PT Start Time  1645    PT Stop Time  1733    PT Time Calculation (min)  48 min    Activity Tolerance  Patient tolerated treatment well    Behavior During Therapy  [facility] for tasks assessed/performed       Past Medical History:  Diagnosis Date  . Allergy   . Hyperlipidemia   . Hypertension     Past Surgical History:  Procedure Laterality Date  . Colapsed lung Right   . HERNIA REPAIR    . left arm surgery    . TENDON REPAIR Right    arm    There were no vitals filed for this visit.  Subjective Assessment - 03/08/19 1722    Subjective  COVID-19 screen performed prior to patient entering clinic. Patient reports getting down on his right knee a few days ago because his sister's basement flooded. No pain in left knee at arrival.    Pertinent History  HTN, asthma, right knee scope 08/24/2018, left knee scop 02/22/2019    Limitations  Sitting;Walking;House hold activities    How long can you stand comfortably?  short periods    How long can you walk comfortably?  within home    Patient Stated Goals  get back to normal    Currently in Pain?  No/denies         [facility] PT Assessment - 03/08/19 0001      Assessment   Medical Diagnosis  Encounter for other orthopedic aftercare, left knee scope    Referring Provider (PT)  Eugenia Mcalpine, MD    Onset Date/Surgical Date  02/22/19    Next MD Visit  03/07/2019    Prior Therapy  no      Precautions   Precautions  Knee    Precaution  Comments  Pain free ROM and strengthening                   [facility] Adult PT Treatment/Exercise - 03/08/19 0001      Exercises   Exercises  Knee/Hip      Knee/Hip Exercises: Aerobic   Recumbent Bike  level 3 x5 minutes    Nustep  Level 3 x10 mins      Knee/Hip Exercises: Standing   Terminal Knee Extension  Strengthening;Left;2 sets;10 reps;Theraband    Theraband Level (Terminal Knee Extension)  Level 2 (Red)    Forward Step Up  Left;2 sets;10 reps;Hand Hold: 0;Step Height: 6"    Rocker Board  3 minutes      Knee/Hip Exercises: Supine   Short Arc Quad Sets  AROM;Left;Other (comment)   x2 minutes     Modalities   Modalities  Electrical Stimulation;Vasopneumatic      Electrical Stimulation   Electrical Stimulation Location  left knee    Electrical Stimulation Action  IFC    Electrical Stimulation Parameters  1-10 hz x10 mins    Electrical Stimulation Goals  Edema  Vasopneumatic   Number Minutes Vasopneumatic   15 minutes    Vasopnuematic Location   Knee    Vasopneumatic Pressure  Low    Vasopneumatic Temperature   34                  PT Long Term Goals - 02/26/19 1554      PT LONG TERM GOAL #1   Title  Patient will be independent with HEP and its progression    Time  4    Period  Weeks    Status  New      PT LONG TERM GOAL #2   Title  Patient will demonstrate 130+ degrees of left knee flexion AROM to improve ability to perform functional tasks.    Time  4    Period  Weeks    Status  New      PT LONG TERM GOAL #3   Title  Patient will demonstrate 4+/5 left knee MMT to improve stability during functional tasks.    Time  4    Period  Weeks    Status  New      PT LONG TERM GOAL #4   Title  Patient report ability to negotiate steps and a ladder with reciprocating pattern with left knee pain less than or equal to 2/10.    Time  4    Period  Weeks    Status  New      PT LONG TERM GOAL #5   Title  Patient will report ability to ambulate  community distances with left knee pain less than or equal to 2/10.    Time  4    Period  Weeks    Status  New            Plan - 03/08/19 1725    Clinical Impression Statement  Patient responded well to therapy session with the addition of more functional TEs. Patient denied any pain or discomfort and was able to demonstrate with good form and technique. Normal response to modalities upon removal.    Personal Factors and Comorbidities  Age;Comorbidity 2    Comorbidities  HTN, Asthma, R knee scope 08/24/2018, left knee scope 02/22/2019    Examination-Activity Limitations  Squat;Stairs;Stand    Examination-Participation Restrictions  Yard Work    Stability/Clinical Decision Making  Stable/Uncomplicated    Clinical Decision Making  Low    Rehab Potential  Excellent    PT Frequency  3x / week    PT Duration  4 weeks    PT Treatment/Interventions  ADLs/Self Care Home Management;Electrical Stimulation;Cryotherapy;Neuromuscular re-education;Therapeutic exercise;Manual techniques;Scar mobilization;Passive range of motion;Vasopneumatic Device;Patient/family education;Moist Heat;Gait training;Stair training;Functional mobility training;Therapeutic activities;Balance training;Taping    PT Next Visit Plan  Nustep or bike, pain free strengthening and ROM. modalities PRN for pain relief    PT Home Exercise Plan  see patient education section    Consulted and Agree with Plan of Care  Patient       Patient will benefit from skilled therapeutic intervention in order to improve the following deficits and impairments:  Decreased range of motion, Pain, Decreased strength, Decreased balance, Difficulty walking, Increased edema  Visit Diagnosis: Acute pain of left knee     Problem List There are no problems to display for this patient.   Gabriela Eves, PT, DPT 03/08/2019, 5:48 PM  [facility] [address] [place], [place], [address] Phone:  [phone]   Fax:  [phone]  Name: Scott Chapman  MRN: [id number] Date of Birth: Feb 09, 1960

## 2019-03-13 ENCOUNTER — Other Ambulatory Visit: Payer: Self-pay

## 2019-03-13 ENCOUNTER — Ambulatory Visit: Payer: BC Managed Care – PPO | Attending: Specialist | Admitting: Physical Therapy

## 2019-03-13 DIAGNOSIS — M25562 Pain in left knee: Secondary | ICD-10-CM | POA: Insufficient documentation

## 2019-03-13 DIAGNOSIS — M25561 Pain in right knee: Secondary | ICD-10-CM | POA: Insufficient documentation

## 2019-03-13 NOTE — Therapy (Signed)
[facility] [address] [place], [place], [address] Phone: [phone]   Fax:  [phone]  Physical Therapy Treatment  Patient Details  Name: Scott Chapman MRN: [id number] Date of Birth: Jul 07, 1960 Referring Provider (PT): Eugenia Mcalpine, MD   Encounter Date: 03/13/2019  PT End of Session - 03/13/19 1358    Visit Number  4    Number of Visits  12    Date for PT Re-Evaluation  04/02/19    Authorization Type  FOTO; Progress note every 10th visit    PT Start Time  0100    PT Stop Time  0153    PT Time Calculation (min)  53 min    Activity Tolerance  Patient tolerated treatment well    Behavior During Therapy  [facility] for tasks assessed/performed       Past Medical History:  Diagnosis Date  . Allergy   . Hyperlipidemia   . Hypertension     Past Surgical History:  Procedure Laterality Date  . Colapsed lung Right   . HERNIA REPAIR    . left arm surgery    . TENDON REPAIR Right    arm    There were no vitals filed for this visit.  Subjective Assessment - 03/13/19 1307    Subjective  COVID-19 screen performed prior to patient entering clinic.  pain islow.    Pertinent History  HTN, asthma, right knee scope 08/24/2018, left knee scop 02/22/2019    Limitations  Sitting;Walking;House hold activities    How long can you stand comfortably?  short periods    How long can you walk comfortably?  within home    Patient Stated Goals  get back to normal    Currently in Pain?  Yes    Pain Score  2     Pain Location  Knee    Pain Orientation  Right    Pain Descriptors / Indicators  Discomfort    Pain Type  Surgical pain    Pain Onset  In the past 7 days                       [facility] Adult PT Treatment/Exercise - 03/13/19 0001      Exercises   Exercises  Knee/Hip      Knee/Hip Exercises: Aerobic   Recumbent Bike  Level 3 x 5 minutes.    Nustep  Level 4 x 15 minutes.      Knee/Hip Exercises: Supine   Short Arc Quad  Sets Limitations  3# x 3 minutes at 7 1/2 plates       Modalities   Modalities  Estate agent Stimulation Location  Left knee.    Electrical Stimulation Action  IFC    Electrical Stimulation Parameters  1-10 Hz x 15 minutes    Electrical Stimulation Goals  Edema      Vasopneumatic   Number Minutes Vasopneumatic   15 minutes    Vasopnuematic Location   --   Left knee.   Vasopneumatic Pressure  Medium                  PT Long Term Goals - 02/26/19 1554      PT LONG TERM GOAL #1   Title  Patient will be independent with HEP and its progression    Time  4    Period  Weeks    Status  New  PT LONG TERM GOAL #2   Title  Patient will demonstrate 130+ degrees of left knee flexion AROM to improve ability to perform functional tasks.    Time  4    Period  Weeks    Status  New      PT LONG TERM GOAL #3   Title  Patient will demonstrate 4+/5 left knee MMT to improve stability during functional tasks.    Time  4    Period  Weeks    Status  New      PT LONG TERM GOAL #4   Title  Patient report ability to negotiate steps and a ladder with reciprocating pattern with left knee pain less than or equal to 2/10.    Time  4    Period  Weeks    Status  New      PT LONG TERM GOAL #5   Title  Patient will report ability to ambulate community distances with left knee pain less than or equal to 2/10.    Time  4    Period  Weeks    Status  New            Plan - 03/13/19 1340    Clinical Impression Statement  Patient did great today.  All exercise was done pain-free.    Personal Factors and Comorbidities  Age;Comorbidity 2    Comorbidities  HTN, Asthma, R knee scope 08/24/2018, left knee scope 02/22/2019    Examination-Activity Limitations  Squat;Stairs;Stand    Examination-Participation Restrictions  Yard Work    Stability/Clinical Decision Making  Stable/Uncomplicated    Rehab Potential  Excellent     PT Frequency  3x / week    PT Duration  4 weeks    PT Treatment/Interventions  ADLs/Self Care Home Management;Electrical Stimulation;Cryotherapy;Neuromuscular re-education;Therapeutic exercise;Manual techniques;Scar mobilization;Passive range of motion;Vasopneumatic Device;Patient/family education;Moist Heat;Gait training;Stair training;Functional mobility training;Therapeutic activities;Balance training;Taping    PT Next Visit Plan  Nustep or bike, pain free strengthening and ROM. modalities PRN for pain relief    PT Home Exercise Plan  see patient education section    Consulted and Agree with Plan of Care  Patient       Patient will benefit from skilled therapeutic intervention in order to improve the following deficits and impairments:  Decreased range of motion, Pain, Decreased strength, Decreased balance, Difficulty walking, Increased edema  Visit Diagnosis: Acute pain of left knee  Acute pain of right knee     Problem List There are no problems to display for this patient.   Lynette Noah, [place] MPT 03/13/2019, 2:00 PM  [facility] [place], [place], [address] Phone: [phone]   Fax:  [phone]  Name: TALHA ISER MRN: [id number] Date of Birth: 1960/04/19

## 2019-03-15 ENCOUNTER — Other Ambulatory Visit: Payer: Self-pay

## 2019-03-15 ENCOUNTER — Encounter: Payer: Self-pay | Admitting: Physical Therapy

## 2019-03-15 ENCOUNTER — Ambulatory Visit: Payer: BC Managed Care – PPO | Admitting: Physical Therapy

## 2019-03-15 DIAGNOSIS — M25561 Pain in right knee: Secondary | ICD-10-CM

## 2019-03-15 DIAGNOSIS — M25562 Pain in left knee: Secondary | ICD-10-CM | POA: Diagnosis not present

## 2019-03-15 NOTE — Therapy (Signed)
[facility] [place], [place], [address] Phone: [phone]   Fax:  [phone]  Physical Therapy Treatment  Patient Details  Name: Scott Chapman MRN: [id number] Date of Birth: 05/05/1960 Referring Provider (PT): Sydnee Cabal, MD   Encounter Date: 03/15/2019  PT End of Session - 03/15/19 1421    Visit Number  5    Number of Visits  12    Date for PT Re-Evaluation  04/02/19    Authorization Type  FOTO; Progress note every 10th visit    PT Start Time  0143    PT Stop Time  0229    PT Time Calculation (min)  46 min    Activity Tolerance  Patient tolerated treatment well    Behavior During Therapy  [facility] for tasks assessed/performed       Past Medical History:  Diagnosis Date  . Allergy   . Hyperlipidemia   . Hypertension     Past Surgical History:  Procedure Laterality Date  . Colapsed lung Right   . HERNIA REPAIR    . left arm surgery    . TENDON REPAIR Right    arm    There were no vitals filed for this visit.  Subjective Assessment - 03/15/19 1353    Subjective  COVID-19 screen performed prior to patient entering clinic.  Patient reported stiffness today.    Pertinent History  HTN, asthma, right knee scope 08/24/2018, left knee scop 02/22/2019    Limitations  Sitting;Walking;House hold activities    How long can you stand comfortably?  short periods    How long can you walk comfortably?  within home    Patient Stated Goals  get back to normal    Currently in Pain?  Yes    Pain Score  2     Pain Location  Knee    Pain Orientation  Left    Pain Descriptors / Indicators  Discomfort    Pain Type  Surgical pain    Pain Onset  1 to 4 weeks ago    Pain Frequency  Constant    Aggravating Factors   increased activity    Pain Relieving Factors  rest                       [facility] Adult PT Treatment/Exercise - 03/15/19 0001      Knee/Hip Exercises: Aerobic   Recumbent Bike  Level 3 x 6 minutes.     Nustep  L4 x20min      Knee/Hip Exercises: Standing   Terminal Knee Extension  Strengthening;Left;2 sets;10 reps;Theraband    Theraband Level (Terminal Knee Extension)  Level 2 (Red)    Forward Step Up  Left;2 sets;10 reps;Hand Hold: 0;Step Height: 6"    Rocker Board  3 minutes      Electrical Stimulation   Electrical Stimulation Location  Left knee.    Chartered certified accountant  IFC    Electrical Stimulation Parameters  1-10hz  x15    Electrical Stimulation Goals  Edema      Vasopneumatic   Number Minutes Vasopneumatic   15 minutes    Vasopnuematic Location   Knee   left   Vasopneumatic Pressure  Medium    Vasopneumatic Temperature   34 for edema                  PT Long Term Goals - 02/26/19 1554      PT LONG TERM GOAL #1  Title  Patient will be independent with HEP and its progression    Time  4    Period  Weeks    Status  New      PT LONG TERM GOAL #2   Title  Patient will demonstrate 130+ degrees of left knee flexion AROM to improve ability to perform functional tasks.    Time  4    Period  Weeks    Status  New      PT LONG TERM GOAL #3   Title  Patient will demonstrate 4+/5 left knee MMT to improve stability during functional tasks.    Time  4    Period  Weeks    Status  New      PT LONG TERM GOAL #4   Title  Patient report ability to negotiate steps and a ladder with reciprocating pattern with left knee pain less than or equal to 2/10.    Time  4    Period  Weeks    Status  New      PT LONG TERM GOAL #5   Title  Patient will report ability to ambulate community distances with left knee pain less than or equal to 2/10.    Time  4    Period  Weeks    Status  New            Plan - 03/15/19 1422    Clinical Impression Statement  Patient tolerated treatment well today. Patient able to perform all exercises with no discomfort. Patient able to perform activities with greater ease yet has stiffness with prolong positions. Patient goals  ongoing due to limitations.    Personal Factors and Comorbidities  Age;Comorbidity 2    Comorbidities  HTN, Asthma, R knee scope 08/24/2018, left knee scope 02/22/2019    Examination-Activity Limitations  Squat;Stairs;Stand    Examination-Participation Restrictions  Yard Work    Stability/Clinical Decision Making  Stable/Uncomplicated    Rehab Potential  Excellent    PT Frequency  3x / week    PT Duration  4 weeks    PT Treatment/Interventions  ADLs/Self Care Home Management;Electrical Stimulation;Cryotherapy;Neuromuscular re-education;Therapeutic exercise;Manual techniques;Scar mobilization;Passive range of motion;Vasopneumatic Device;Patient/family education;Moist Heat;Gait training;Stair training;Functional mobility training;Therapeutic activities;Balance training;Taping    PT Next Visit Plan  Nustep or bike, pain free strengthening and ROM. modalities PRN for pain relief    Consulted and Agree with Plan of Care  Patient       Patient will benefit from skilled therapeutic intervention in order to improve the following deficits and impairments:  Decreased range of motion, Pain, Decreased strength, Decreased balance, Difficulty walking, Increased edema  Visit Diagnosis: Acute pain of left knee  Acute pain of right knee     Problem List There are no problems to display for this patient.   Hermelinda Dellen, PTA 03/15/2019, 2:26 PM  [facility] [address] [place], [place], [address] Phone: [phone]   Fax:  [phone]  Name: Scott Chapman MRN: [id number] Date of Birth: [birth year]

## 2019-03-21 ENCOUNTER — Ambulatory Visit: Payer: BC Managed Care – PPO | Admitting: Physical Therapy

## 2019-03-21 ENCOUNTER — Other Ambulatory Visit: Payer: Self-pay

## 2019-03-21 DIAGNOSIS — M25562 Pain in left knee: Secondary | ICD-10-CM

## 2019-03-21 DIAGNOSIS — M25561 Pain in right knee: Secondary | ICD-10-CM

## 2019-03-21 NOTE — Therapy (Signed)
[facility] [address] [place], [place], [address] Phone: [phone]   Fax:  [phone]  Physical Therapy Treatment  Patient Details  Name: Scott Chapman MRN: [id number] Date of Birth: 05/07/1960 Referring Provider (PT): Robert Collins, MD   Encounter Date: 03/21/2019  PT End of Session - 03/21/19 1453    Visit Number  6    Number of Visits  12    Date for PT Re-Evaluation  04/02/19    Authorization Type  FOTO; Progress note every 10th visit    PT Start Time  0147    PT Stop Time  0240    PT Time Calculation (min)  53 min    Activity Tolerance  Patient tolerated treatment well    Behavior During Therapy  [facility] for tasks assessed/performed       Past Medical History:  Diagnosis Date  . Allergy   . Hyperlipidemia   . Hypertension     Past Surgical History:  Procedure Laterality Date  . Colapsed lung Right   . HERNIA REPAIR    . left arm surgery    . TENDON REPAIR Right    arm    There were no vitals filed for this visit.                    [facility] Adult PT Treatment/Exercise - 03/21/19 0001      Exercises   Exercises  Knee/Hip      Knee/Hip Exercises: Aerobic   Recumbent Bike  Level 3 x 10 minutes.    Nustep  Level 4 x 10 minutes.      Knee/Hip Exercises: Machines for Strengthening   Cybex Knee Extension  10# x 2 minutes    Cybex Leg Press  2 plates x 2 minutes.      Vasopneumatic   Number Minutes Vasopneumatic   20 minutes    Vasopnuematic Location   --   Left knee.   Vasopneumatic Pressure  Medium                  PT Long Term Goals - 03/15/19 1427      PT LONG TERM GOAL #1   Title  Patient will be independent with HEP and its progression    Time  4    Period  Weeks    Status  On-going      PT LONG TERM GOAL #2   Title  Patient will demonstrate 130+ degrees of left knee flexion AROM to improve ability to perform functional tasks.    Baseline  Met 03/15/19    Time  4    Period  Weeks    Status  Achieved   AROM 130 degrees 03/15/19     PT LONG TERM GOAL #3   Title  Patient will demonstrate 4+/5 left knee MMT to improve stability during functional tasks.    Time  4    Period  Weeks    Status  On-going      PT LONG TERM GOAL #4   Title  Patient report ability to negotiate steps and a ladder with reciprocating pattern with left knee pain less than or equal to 2/10.    Time  4    Period  Weeks    Status  On-going      PT LONG TERM GOAL #5   Title  Patient will report ability to ambulate community distances with left knee pain less than or equal to 2/10.      Time  4    Period  Weeks    Status  On-going            Plan - 03/21/19 1456    Clinical Impression Statement  Excellent job with resisted exercise today with no pain increase.  He states he was seated for a long period of time today and when he got up his knees were quite stiff.  He felt better after ther ex.    Personal Factors and Comorbidities  Age;Comorbidity 2    Comorbidities  HTN, Asthma, R knee scope 08/24/2018, left knee scope 02/22/2019    Examination-Activity Limitations  Squat;Stairs;Stand    Examination-Participation Restrictions  Yard Work    Stability/Clinical Decision Making  Stable/Uncomplicated    Rehab Potential  Excellent    PT Frequency  3x / week    PT Duration  4 weeks    PT Treatment/Interventions  ADLs/Self Care Home Management;Electrical Stimulation;Cryotherapy;Neuromuscular re-education;Therapeutic exercise;Manual techniques;Scar mobilization;Passive range of motion;Vasopneumatic Device;Patient/family education;Moist Heat;Gait training;Stair training;Functional mobility training;Therapeutic activities;Balance training;Taping    PT Next Visit Plan  Nustep or bike, pain free strengthening and ROM. modalities PRN for pain relief    PT Home Exercise Plan  see patient education section    Consulted and Agree with Plan of Care  Patient       Patient will benefit from  skilled therapeutic intervention in order to improve the following deficits and impairments:  Decreased range of motion, Pain, Decreased strength, Decreased balance, Difficulty walking, Increased edema  Visit Diagnosis: Acute pain of left knee  Acute pain of right knee     Problem List There are no problems to display for this patient.   Ryelynn Guedea, [place] MPT 03/21/2019, 2:59 PM  [facility] [address] [place], [place], [address] Phone: [phone]   Fax:  [phone]  Name: Scott Chapman MRN: [id number] Date of Birth: 1960-12-31

## 2019-03-28 ENCOUNTER — Other Ambulatory Visit: Payer: Self-pay

## 2019-03-28 ENCOUNTER — Encounter: Payer: Self-pay | Admitting: Physical Therapy

## 2019-03-28 ENCOUNTER — Ambulatory Visit: Payer: BC Managed Care – PPO | Admitting: Physical Therapy

## 2019-03-28 DIAGNOSIS — M25562 Pain in left knee: Secondary | ICD-10-CM

## 2019-03-28 DIAGNOSIS — M25561 Pain in right knee: Secondary | ICD-10-CM

## 2019-03-28 NOTE — Therapy (Addendum)
[facility] [place], [place], [address] Phone: [phone]   Fax:  [phone]  Physical Therapy Treatment PHYSICAL THERAPY DISCHARGE SUMMARY  Visits from Start of Care: 7  Current functional level related to goals / functional outcomes: See below   Remaining deficits: See goals   Education / Equipment: HEP Plan: Patient agrees to discharge.  Patient goals were not met. Patient is being discharged due to lack of progress.  ?????  Gabriela Eves, PT, DPT 12/11/19    Patient Details  Name: Scott Chapman MRN: [id number] Date of Birth: November 21, 1960 Referring Provider (PT): Sydnee Cabal, MD   Encounter Date: 03/28/2019  PT End of Session - 03/28/19 1402    Visit Number  7    Number of Visits  12    Date for PT Re-Evaluation  04/02/19    Authorization Type  FOTO; Progress note every 10th visit    PT Start Time  1349    PT Stop Time  1428    PT Time Calculation (min)  39 min    Activity Tolerance  Patient tolerated treatment well    Behavior During Therapy  [facility] for tasks assessed/performed       Past Medical History:  Diagnosis Date  . Allergy   . Hyperlipidemia   . Hypertension     Past Surgical History:  Procedure Laterality Date  . Colapsed lung Right   . HERNIA REPAIR    . left arm surgery    . TENDON REPAIR Right    arm    There were no vitals filed for this visit.  Subjective Assessment - 03/28/19 1400    Subjective  COVID-19 screen performed prior to patient entering clinic. Reports no pain but returns to MD 04/06/2019.    Pertinent History  HTN, asthma, right knee scope 08/24/2018, left knee scop 02/22/2019    Limitations  Sitting;Walking;House hold activities    How long can you stand comfortably?  short periods    How long can you walk comfortably?  within home    Patient Stated Goals  get back to normal    Currently in Pain?  No/denies         [facility] PT Assessment - 03/28/19 0001       Assessment   Medical Diagnosis  Encounter for other orthopedic aftercare, left knee scope    Referring Provider (PT)  Sydnee Cabal, MD    Onset Date/Surgical Date  02/22/19    Hand Dominance  Right    Next MD Visit  04/06/2019    Prior Therapy  no      Precautions   Precautions  Knee    Type of Shoulder Precautions  left knee                   [facility] Adult PT Treatment/Exercise - 03/28/19 0001      Knee/Hip Exercises: Aerobic   Recumbent Bike  Level 3 x 10 minutes.      Knee/Hip Exercises: Machines for Strengthening   Cybex Knee Extension  10# 3x10 reps     Cybex Knee Flexion  40# 3x10 reps    Cybex Leg Press  2 plates x20 reps      Knee/Hip Exercises: Standing   Forward Step Up  Left;2 sets;10 reps;Hand Hold: 2;Step Height: 8"    Step Down  Left;20 reps;Hand Hold: 2;Step Height: 4"   Heel dot   Step Down Limitations  Modalities   Modalities  Vasopneumatic      Vasopneumatic   Number Minutes Vasopneumatic   10 minutes    Vasopnuematic Location   Knee    Vasopneumatic Pressure  Medium    Vasopneumatic Temperature   34 for edema                  PT Long Term Goals - 03/28/19 1418      PT LONG TERM GOAL #1   Title  Patient will be independent with HEP and its progression    Time  4    Period  Weeks    Status  On-going      PT LONG TERM GOAL #2   Title  Patient will demonstrate 130+ degrees of left knee flexion AROM to improve ability to perform functional tasks.    Baseline  Met 03/15/19    Time  4    Period  Weeks    Status  Achieved   AROM 130 degrees 03/15/19     PT LONG TERM GOAL #3   Title  Patient will demonstrate 4+/5 left knee MMT to improve stability during functional tasks.    Time  4    Period  Weeks    Status  On-going      PT LONG TERM GOAL #4   Title  Patient report ability to negotiate steps and a ladder with reciprocating pattern with left knee pain less than or equal to 2/10.    Time  4    Period  Weeks     Status  Partially Met   Has not tried a ladder at this time as of 03/28/2019     PT LONG TERM GOAL #5   Title  Patient will report ability to ambulate community distances with left knee pain less than or equal to 2/10.    Time  4    Period  Weeks    Status  Achieved            Plan - 03/28/19 1432    Clinical Impression Statement  Patient presented in clinic with no complaints of pain. Patient reports reciprical stair gait at his stairs at home. Patient progressed to more resisted strengthening with only reports of generalized fatigue. Normal vasopnuematic response noted following removal of the modality.    Personal Factors and Comorbidities  Age;Comorbidity 2    Comorbidities  HTN, Asthma, R knee scope 08/24/2018, left knee scope 02/22/2019    Examination-Activity Limitations  Squat;Stairs;Stand    Examination-Participation Restrictions  Yard Work    Stability/Clinical Decision Making  Stable/Uncomplicated    Rehab Potential  Excellent    PT Frequency  3x / week    PT Duration  4 weeks    PT Treatment/Interventions  ADLs/Self Care Home Management;Electrical Stimulation;Cryotherapy;Neuromuscular re-education;Therapeutic exercise;Manual techniques;Scar mobilization;Passive range of motion;Vasopneumatic Device;Patient/family education;Moist Heat;Gait training;Stair training;Functional mobility training;Therapeutic activities;Balance training;Taping    PT Next Visit Plan  Nustep or bike, pain free strengthening and ROM. modalities PRN for pain relief    PT Home Exercise Plan  see patient education section    Consulted and Agree with Plan of Care  Patient       Patient will benefit from skilled therapeutic intervention in order to improve the following deficits and impairments:  Decreased range of motion, Pain, Decreased strength, Decreased balance, Difficulty walking, Increased edema  Visit Diagnosis: Acute pain of left knee  Acute pain of right knee     Problem List There are  no problems to display for this patient.   Standley Brooking, PTA 03/28/2019, 2:34 PM  [facility] [address] [place], [place], [address] Phone: [phone]   Fax:  [phone]  Name: Scott Chapman MRN: [id number] Date of Birth: 1960/02/17

## 2019-04-04 ENCOUNTER — Ambulatory Visit: Payer: BC Managed Care – PPO | Admitting: Physical Therapy

## 2020-08-21 ENCOUNTER — Encounter: Payer: Self-pay | Admitting: Physical Therapy

## 2020-08-21 ENCOUNTER — Other Ambulatory Visit: Payer: Self-pay

## 2020-08-21 ENCOUNTER — Ambulatory Visit: Payer: BC Managed Care – PPO | Attending: Specialist | Admitting: Physical Therapy

## 2020-08-21 DIAGNOSIS — M25561 Pain in right knee: Secondary | ICD-10-CM | POA: Insufficient documentation

## 2020-08-21 NOTE — Therapy (Signed)
[facility] [address] [place], [place], [address] Phone: [phone]   Fax:  [phone]  Physical Therapy Evaluation  Patient Details  Name: Scott Chapman MRN: [id number] Date of Birth: 1960-06-25 Referring Provider (PT): Eugenia Mcalpine MD   Encounter Date: 08/21/2020   PT End of Session - 08/21/20 1602     Visit Number 1    Number of Visits 8    Date for PT Re-Evaluation 09/18/20    PT Start Time 0235    PT Stop Time 0320    PT Time Calculation (min) 45 min    Activity Tolerance Patient tolerated treatment well    Behavior During Therapy [facility] for tasks assessed/performed             Past Medical History:  Diagnosis Date   Allergy    Hyperlipidemia    Hypertension     Past Surgical History:  Procedure Laterality Date   Colapsed lung Right    HERNIA REPAIR     left arm surgery     TENDON REPAIR Right    arm    There were no vitals filed for this visit.    Subjective Assessment - 08/21/20 1500     Subjective COVID-19 screen performed prior to patient entering clinic.  The patient presents to the clinic today with c/o right knee pain over the last three months.  He states that after doing a lot of mowing at the end of the day it hurts a great deal trying to take off his socks.  Today, at rest, and with medication (prednisone) his pain is a low 2/10.  Rest and medication decrease his pain.    Pertinent History Bilateral arthroscopic knee surgery, right shoulder surgery, HTN, hernia repair, bil arm surgeries.    Patient Stated Goals Get out pain.    Currently in Pain? Yes    Pain Score 2     Pain Location Knee    Pain Orientation Right    Pain Descriptors / Indicators Aching    Pain Type Acute pain    Pain Onset More than a month ago    Pain Frequency Constant    Aggravating Factors  Being on mower for long periods of time and then bending knee.    Pain Relieving Factors Rest and medication.                 [facility] PT Assessment - 08/21/20 0001       Assessment   Medical Diagnosis Strain of hamstring tendon    Referring Provider (PT) Eugenia Mcalpine MD    Onset Date/Surgical Date --   ~3 months.     Precautions   Precautions None      Restrictions   Weight Bearing Restrictions No      Home Environment   Living Environment Private residence      Prior Function   Level of Independence Independent      ROM / Strength   AROM / PROM / Strength AROM;Strength      AROM   Overall AROM Comments Full active right knee range of motion      Strength   Overall Strength Comments Normal right knee strength.      Palpation   Palpation comment Tender to palpation over right lateral/distal hamstring.      Special Tests   Other special tests 90/90 test is normal.  Normal knee stability.      Ambulation/Gait   Gait Comments WNL.  Objective measurements completed on examination: See above findings.       [facility] Adult PT Treatment/Exercise - 08/21/20 0001       Modalities   Modalities Electrical Stimulation;Vasopneumatic      Electrical Stimulation   Electrical Stimulation Location Right distal/lateral hamstring    Electrical Stimulation Action Pre-mod.    Electrical Stimulation Parameters 80-150 Hz. x 20 minutes.    Electrical Stimulation Goals Pain      Vasopneumatic   Number Minutes Vasopneumatic  20 minutes    Vasopnuematic Location  --   Right knee.   Vasopneumatic Pressure Low                         PT Long Term Goals - 08/21/20 1604       PT LONG TERM GOAL #1   Title Patient will be independent with HEP and its progression    Time 4    Period Weeks    Status New      PT LONG TERM GOAL #2   Title Patient able to do normal mowing and move right knee with pain not > 2-3/10.    Time 4    Period Weeks    Status New                    Plan - 08/21/20 1557     Clinical Impression Statement  The patient presents to OPPT with c/o right knee pain.  He states he can be in severe pain after being on his mower for long periods of time and the attempting to bend his knee.  His right knee range of motion and strenght is normal.  He exhibits normal stability and essentially normal hamstring length assessed via the 90/90 test.  His CC is palpable tenderness over his right distal/lateral hamstring region.  Patient will benefit from skilled physical therapy intervention to address pain.    Personal Factors and Comorbidities Comorbidity 1;Comorbidity 2;Other    Comorbidities Bilateral arthroscopic knee surgery, right shoulder surgery, HTN, hernia repair, bil arm surgeries, left elbow pain.    Examination-Activity Limitations Other    Examination-Participation Restrictions Other    Stability/Clinical Decision Making Stable/Uncomplicated    Rehab Potential Excellent    PT Frequency 2x / week    PT Duration 4 weeks    PT Treatment/Interventions ADLs/Self Care Home Management;Cryotherapy;Electrical Stimulation;Ultrasound;Moist Heat;Therapeutic activities;Therapeutic exercise;Manual techniques;Patient/family education;Passive range of motion;Dry needling;Vasopneumatic Device    PT Next Visit Plan Combo e'stim/US and STW/M to patient's right affected hamstring region.  Pain-free concentric and eccentric hamstring curls.    Consulted and Agree with Plan of Care Patient             Patient will benefit from skilled therapeutic intervention in order to improve the following deficits and impairments:  Pain, Decreased activity tolerance  Visit Diagnosis: Acute pain of right knee - Plan: PT plan of care cert/re-cert     Problem List There are no problems to display for this patient.   Rubie Ficco, [place] MPT 08/21/2020, 4:09 PM  [facility] [address] [place], [place], [address] Phone: [phone]   Fax:  [phone]  Name: Scott Chapman MRN:  [id number] Date of Birth: 26-Aug-1960

## 2020-08-27 ENCOUNTER — Observation Stay (HOSPITAL_COMMUNITY)
Admission: EM | Admit: 2020-08-27 | Discharge: 2020-08-28 | Disposition: A | Discharge status: Home / Self Care | Payer: BC Managed Care – PPO | Attending: Family Medicine | Admitting: Family Medicine

## 2020-08-27 ENCOUNTER — Other Ambulatory Visit: Payer: Self-pay

## 2020-08-27 ENCOUNTER — Emergency Department (HOSPITAL_COMMUNITY): Payer: BC Managed Care – PPO

## 2020-08-27 ENCOUNTER — Encounter (HOSPITAL_COMMUNITY): Payer: Self-pay

## 2020-08-27 DIAGNOSIS — J449 Chronic obstructive pulmonary disease, unspecified: Secondary | ICD-10-CM | POA: Insufficient documentation

## 2020-08-27 DIAGNOSIS — I10 Essential (primary) hypertension: Secondary | ICD-10-CM | POA: Diagnosis not present

## 2020-08-27 DIAGNOSIS — Z20822 Contact with and (suspected) exposure to covid-19: Secondary | ICD-10-CM | POA: Insufficient documentation

## 2020-08-27 DIAGNOSIS — R200 Anesthesia of skin: Secondary | ICD-10-CM | POA: Insufficient documentation

## 2020-08-27 DIAGNOSIS — J45909 Unspecified asthma, uncomplicated: Secondary | ICD-10-CM | POA: Insufficient documentation

## 2020-08-27 DIAGNOSIS — I639 Cerebral infarction, unspecified: Secondary | ICD-10-CM | POA: Diagnosis present

## 2020-08-27 DIAGNOSIS — H349 Unspecified retinal vascular occlusion: Secondary | ICD-10-CM

## 2020-08-27 DIAGNOSIS — Y900 Blood alcohol level of less than 20 mg/100 ml: Secondary | ICD-10-CM | POA: Diagnosis not present

## 2020-08-27 DIAGNOSIS — F909 Attention-deficit hyperactivity disorder, unspecified type: Secondary | ICD-10-CM | POA: Diagnosis not present

## 2020-08-27 DIAGNOSIS — Z79899 Other long term (current) drug therapy: Secondary | ICD-10-CM | POA: Insufficient documentation

## 2020-08-27 DIAGNOSIS — H34232 Retinal artery branch occlusion, left eye: Principal | ICD-10-CM | POA: Insufficient documentation

## 2020-08-27 DIAGNOSIS — E119 Type 2 diabetes mellitus without complications: Secondary | ICD-10-CM | POA: Insufficient documentation

## 2020-08-27 LAB — DIFFERENTIAL
Abs Immature Granulocytes: 0.08 10*3/uL — ABNORMAL HIGH (ref 0.00–0.07)
Basophils Absolute: 0 10*3/uL (ref 0.0–0.1)
Basophils Relative: 0 %
Eosinophils Absolute: 0.1 10*3/uL (ref 0.0–0.5)
Eosinophils Relative: 1 %
Immature Granulocytes: 1 %
Lymphocytes Relative: 21 %
Lymphs Abs: 2 10*3/uL (ref 0.7–4.0)
Monocytes Absolute: 0.7 10*3/uL (ref 0.1–1.0)
Monocytes Relative: 7 %
Neutro Abs: 6.6 10*3/uL (ref 1.7–7.7)
Neutrophils Relative %: 70 %

## 2020-08-27 LAB — CBC
HCT: 46.2 % (ref 39.0–52.0)
Hemoglobin: 15.2 g/dL (ref 13.0–17.0)
MCH: 31 pg (ref 26.0–34.0)
MCHC: 32.9 g/dL (ref 30.0–36.0)
MCV: 94.1 fL (ref 80.0–100.0)
Platelets: 269 10*3/uL (ref 150–400)
RBC: 4.91 MIL/uL (ref 4.22–5.81)
RDW: 13.2 % (ref 11.5–15.5)
WBC: 9.5 10*3/uL (ref 4.0–10.5)
nRBC: 0 % (ref 0.0–0.2)

## 2020-08-27 NOTE — ED Triage Notes (Signed)
POV from home with cc of being sent over from the eye dr. This morning at 07:38 pt was awake and all of a sudden he had loss of vision in the left eye only. Went straight to the eye doctor with wife. 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 Was told to come here for further eval.

## 2020-08-27 NOTE — ED Notes (Signed)
Gave edp EKG; inform edp about pt triage and pt concerns. Placed in VT2 at this time.

## 2020-08-27 NOTE — ED Provider Notes (Signed)
[facility] EMERGENCY DEPARTMENT Provider Note   CSN: [id number] Arrival date & time: 08/27/20  1951     History Chief Complaint  Patient presents with   Left Retinal Artery Occlusion     Scott Chapman is a 60 y.o. male.  Patient had vision loss at about 7:30 in the morning on Wednesday.  Patient went to [facility] was seen by ophthalmology.  They confirmed a left retinal artery occlusion.  They were given a list of things to take to their primary care doctor to have done.  Apparently did not recommend admission.  Patient later in the day developed some numbness to the right foot and some numb feeling to the left elbow.  But those have all resolved now.  He got concerned about possible stroke.  So he came in.  Patient's past medical history significant for hypertension hyperlipidemia.  Patient has not had a stroke in the past.  No visual problems in the past.      Past Medical History:  Diagnosis Date   Allergy    Hyperlipidemia    Hypertension     There are no problems to display for this patient.   Past Surgical History:  Procedure Laterality Date   Colapsed lung Right    foot surgery Right    HERNIA REPAIR     KNEE SURGERY Bilateral    left arm surgery     ROTATOR CUFF REPAIR Right    TENDON REPAIR Right    arm       Family History  Problem Relation Age of Onset   Stroke Father     Social History   Tobacco Use   Smoking status: Never   Smokeless tobacco: Never  Substance Use Topics   Alcohol use: Yes    Alcohol/week: 0.0 standard drinks   Drug use: No    Home Medications Prior to Admission medications   Medication Sig Start Date End Date Taking? Authorizing Provider  albuterol (PROAIR HFA) 108 (90 Base) MCG/ACT inhaler  06/16/15   [provider]  albuterol (PROVENTIL) (2.5 MG/3ML) 0.083% nebulizer solution  06/19/13   [provider]  clobetasol (OLUX) 0.05 % topical foam APPLY TOPICALLY 2 (TWO) TIMES DAILY. 04/29/15    [provider]  olmesartan-hydrochlorothiazide (BENICAR HCT) 20-12.5 MG tablet  06/10/15   [provider]  pravastatin (PRAVACHOL) 40 MG tablet  12/31/14   [provider]  predniSONE (DELTASONE) 20 MG tablet 2 po at same time daily for 5 days 07/23/15   Dettinger, Elige Radon, MD  SYMBICORT 160-4.5 MCG/ACT inhaler USE 2 PUFFS INTO THE LUNGS 2 TIMES A DAY 06/16/15   [provider]  VIAGRA 100 MG tablet TAKE 1 TABLET (100 MG TOTAL) BY MOUTH AS NEEDED FOR ERECTILE DYSFUNCTION. 06/25/15   [provider]    Allergies    Penicillins  Review of Systems   Review of Systems  Constitutional:  Negative for chills and fever.  HENT:  Negative for ear pain and sore throat.   Eyes:  Positive for visual disturbance. Negative for pain.  Respiratory:  Negative for cough and shortness of breath.   Cardiovascular:  Negative for chest pain and palpitations.  Gastrointestinal:  Negative for abdominal pain and vomiting.  Genitourinary:  Negative for dysuria and hematuria.  Musculoskeletal:  Negative for arthralgias and back pain.  Skin:  Negative for color change and rash.  Neurological:  Positive for numbness. Negative for dizziness, tremors, seizures, syncope, facial asymmetry, speech difficulty,  weakness and headaches.  All other systems reviewed and are negative.  Physical Exam Updated Vital Signs BP (!) 132/92   Pulse 75   Temp 98.4 F (36.9 C)   Resp (!) 26   Ht 1.702 m (5\' 7" )   Wt 90.3 kg   SpO2 97%   BMI 31.17 kg/m   Physical Exam Vitals and nursing note reviewed.  Constitutional:      Appearance: He is well-developed.  HENT:     Head: Normocephalic and atraumatic.     Mouth/Throat:     Mouth: Mucous membranes are moist.  Eyes:     Conjunctiva/sclera: Conjunctivae normal.     Comments: Minimal reactivity of the left pupil.  Good reactivity shining light into the right pupil and that also makes the left pupil contract.  Patient states he only  sees a minimal of light like a pinpoint out of the left eye.  Has good vision in the right eye.  Extract muscles intact.  Cardiovascular:     Rate and Rhythm: Normal rate and regular rhythm.     Heart sounds: No murmur heard. Pulmonary:     Effort: Pulmonary effort is normal. No respiratory distress.     Breath sounds: Normal breath sounds.  Abdominal:     Palpations: Abdomen is soft.     Tenderness: There is no abdominal tenderness.  Musculoskeletal:        General: No swelling. Normal range of motion.     Cervical back: Normal range of motion and neck supple.  Skin:    General: Skin is warm and dry.  Neurological:     General: No focal deficit present.     Mental Status: He is alert and oriented to person, place, and time.     Cranial Nerves: No cranial nerve deficit.     Sensory: No sensory deficit.     Motor: No weakness.    ED Results / Procedures / Treatments   Labs (all labs ordered are listed, but only abnormal results are displayed) Labs Reviewed  DIFFERENTIAL - Abnormal; Notable for the following components:      Result Value   Abs Immature Granulocytes 0.08 (*)    All other components within normal limits  COMPREHENSIVE METABOLIC PANEL - Abnormal; Notable for the following components:   Glucose, Bld 113 (*)    BUN 21 (*)    All other components within normal limits  RESP PANEL BY RT-PCR (FLU A&B, COVID) ARPGX2  ETHANOL  PROTIME-INR  APTT  CBC  RAPID URINE DRUG SCREEN, HOSP PERFORMED  URINALYSIS, ROUTINE W REFLEX MICROSCOPIC  I-STAT CHEM 8, ED    EKG None  Radiology CT HEAD WO CONTRAST  Result Date: 08/27/2020 CLINICAL DATA:  Neuro deficit, acute, stroke suspected Left eye vision loss. EXAM: CT HEAD WITHOUT CONTRAST TECHNIQUE: Contiguous axial images were obtained from the base of the skull through the vertex without intravenous contrast. COMPARISON:  None. FINDINGS: Brain: No intracranial hemorrhage, mass effect, or midline shift. No hydrocephalus. The  basilar cisterns are patent. No evidence of territorial infarct or acute ischemia. No extra-axial or intracranial fluid collection. Vascular: No hyperdense vessel. Skull: No fracture or focal lesion. Sinuses/Orbits: Paranasal sinuses and mastoid air cells are clear. The visualized orbits are unremarkable. No evidence of globe abnormality. Other: None. IMPRESSION: Negative noncontrast head CT. Electronically Signed   By: 08/29/2020 M.D.   On: 08/27/2020 23:48    Procedures Procedures   Medications Ordered in ED Medications - No data  to display  ED Course  I have reviewed the triage vital signs and the nursing notes.  Pertinent labs & imaging results that were available during my care of the patient were reviewed by me and considered in my medical decision making (see chart for details).    MDM Rules/Calculators/A&P                         CRITICAL CARE Performed by: Vanetta Mulders Total critical care time: 60 minutes Critical care time was exclusive of separately billable procedures and treating other patients. Critical care was necessary to treat or prevent imminent or life-threatening deterioration. Critical care was time spent personally by me on the following activities: development of treatment plan with patient and/or surrogate as well as nursing, discussions with consultants, evaluation of patient's response to treatment, examination of patient, obtaining history from patient or surrogate, ordering and performing treatments and interventions, ordering and review of laboratory studies, ordering and review of radiographic studies, pulse oximetry and re-evaluation of patient's condition.   Had teleneurology specialist see patient.  Figured that he was going to need admission for stroke work-up.  Little surprising that ophthalmology did not send him directly into [facility] after the confirmation of the left retinal artery occlusion.  Patient's other symptoms have now resolved.   Seen by teleneurology they are not concerned about that but does need admission for stroke work-up can wait till morning for MRI.  Did not recommend CT angio head or neck.  Patient's labs without any significant abnormalities.  Blood sugar is 113.  Renal function normal.  CT head without any acute findings.  Nothing to do acutely for the retinal artery occlusion.  Ophthalmology told patient that they had done some things to try to relieve the occlusion.  But it was not anything else that could be done acutely.  Will have hospitalist admit.  For stroke work-up MRI in the morning.   Final Clinical Impression(s) / ED Diagnoses Final diagnoses:  Retinal artery occlusion    Rx / DC Orders ED Discharge Orders     None        Vanetta Mulders, MD 08/28/20 0109

## 2020-08-28 ENCOUNTER — Observation Stay (HOSPITAL_COMMUNITY): Payer: BC Managed Care – PPO

## 2020-08-28 ENCOUNTER — Ambulatory Visit: Payer: BC Managed Care – PPO | Admitting: *Deleted

## 2020-08-28 ENCOUNTER — Observation Stay (HOSPITAL_BASED_OUTPATIENT_CLINIC_OR_DEPARTMENT_OTHER): Payer: BC Managed Care – PPO

## 2020-08-28 DIAGNOSIS — I6389 Other cerebral infarction: Secondary | ICD-10-CM | POA: Diagnosis not present

## 2020-08-28 DIAGNOSIS — H349 Unspecified retinal vascular occlusion: Secondary | ICD-10-CM

## 2020-08-28 DIAGNOSIS — I639 Cerebral infarction, unspecified: Secondary | ICD-10-CM | POA: Diagnosis present

## 2020-08-28 LAB — COMPREHENSIVE METABOLIC PANEL
ALT: 34 U/L (ref 0–44)
ALT: 34 U/L (ref 0–44)
AST: 24 U/L (ref 15–41)
AST: 21 U/L (ref 15–41)
Albumin: 3.8 g/dL (ref 3.5–5.0)
Albumin: 3.7 g/dL (ref 3.5–5.0)
Alkaline Phosphatase: 62 U/L (ref 38–126)
Alkaline Phosphatase: 56 U/L (ref 38–126)
Anion gap: 11 (ref 5–15)
Anion gap: 9 (ref 5–15)
BUN: 21 mg/dL — ABNORMAL HIGH (ref 6–20)
BUN: 20 mg/dL (ref 6–20)
CO2: 25 mmol/L (ref 22–32)
CO2: 30 mmol/L (ref 22–32)
Calcium: 9.7 mg/dL (ref 8.9–10.3)
Calcium: 9.8 mg/dL (ref 8.9–10.3)
Chloride: 101 mmol/L (ref 98–111)
Chloride: 99 mmol/L (ref 98–111)
Creatinine, Ser: 0.9 mg/dL (ref 0.61–1.24)
Creatinine, Ser: 0.88 mg/dL (ref 0.61–1.24)
GFR, Estimated: >60 mL/min (ref >60)
GFR, Estimated: >60 mL/min (ref >60)
Glucose, Bld: 113 mg/dL — ABNORMAL HIGH (ref 70–99)
Glucose, Bld: 114 mg/dL — ABNORMAL HIGH (ref 70–99)
Potassium: 4.1 mmol/L (ref 3.5–5.1)
Potassium: 4.4 mmol/L (ref 3.5–5.1)
Sodium: 137 mmol/L (ref 135–145)
Sodium: 138 mmol/L (ref 135–145)
Total Bilirubin: 0.6 mg/dL (ref 0.3–1.2)
Total Bilirubin: 0.7 mg/dL (ref 0.3–1.2)
Total Protein: 7.1 g/dL (ref 6.5–8.1)
Total Protein: 6.8 g/dL (ref 6.5–8.1)

## 2020-08-28 LAB — PROTIME-INR
INR: 0.9 (ref 0.8–1.2)
Prothrombin Time: 12.4 seconds (ref 11.4–15.2)

## 2020-08-28 LAB — MAGNESIUM: Magnesium: 2.1 mg/dL (ref 1.7–2.4)

## 2020-08-28 LAB — URINALYSIS, ROUTINE W REFLEX MICROSCOPIC
Bacteria, UA: NONE SEEN
Bilirubin Urine: NEGATIVE
Glucose, UA: NEGATIVE mg/dL
Hgb urine dipstick: NEGATIVE
Ketones, ur: NEGATIVE mg/dL
Leukocytes,Ua: NEGATIVE
Nitrite: NEGATIVE
Protein, ur: NEGATIVE mg/dL
Specific Gravity, Urine: 1.019 (ref 1.005–1.030)
pH: 7 (ref 5.0–8.0)

## 2020-08-28 LAB — CBC
HCT: 47 % (ref 39.0–52.0)
Hemoglobin: 14.9 g/dL (ref 13.0–17.0)
MCH: 30.2 pg (ref 26.0–34.0)
MCHC: 31.7 g/dL (ref 30.0–36.0)
MCV: 95.3 fL (ref 80.0–100.0)
Platelets: 272 10*3/uL (ref 150–400)
RBC: 4.93 MIL/uL (ref 4.22–5.81)
RDW: 13.1 % (ref 11.5–15.5)
WBC: 8.5 10*3/uL (ref 4.0–10.5)
nRBC: 0 % (ref 0.0–0.2)

## 2020-08-28 LAB — RAPID URINE DRUG SCREEN, HOSP PERFORMED
Amphetamines: POSITIVE — AB
Barbiturates: NOT DETECTED
Benzodiazepines: NOT DETECTED
Cocaine: NOT DETECTED
Opiates: NOT DETECTED
Tetrahydrocannabinol: POSITIVE — AB

## 2020-08-28 LAB — ECHOCARDIOGRAM COMPLETE
AR max vel: 2.33 cm2
AV Area VTI: 2.57 cm2
AV Area mean vel: 2.41 cm2
AV Mean grad: 5 mmHg
AV Peak grad: 11 mmHg
Ao pk vel: 1.66 m/s
Area-P 1/2: 3.08 cm2
Height: 67 in
MV VTI: 3.32 cm2
S' Lateral: 3.23 cm
Weight: 3184 oz

## 2020-08-28 LAB — LIPID PANEL
Cholesterol: 204 mg/dL — ABNORMAL HIGH (ref 0–200)
HDL: 55 mg/dL (ref >40)
LDL Cholesterol: 123 mg/dL — ABNORMAL HIGH (ref 0–99)
Total CHOL/HDL Ratio: 3.7 RATIO
Triglycerides: 128 mg/dL (ref <150)
VLDL: 26 mg/dL (ref 0–40)

## 2020-08-28 LAB — APTT: aPTT: 29 seconds (ref 24–36)

## 2020-08-28 LAB — RESP PANEL BY RT-PCR (FLU A&B, COVID) ARPGX2
Influenza A by PCR: NEGATIVE
Influenza B by PCR: NEGATIVE
SARS Coronavirus 2 by RT PCR: NEGATIVE

## 2020-08-28 LAB — VITAMIN B12: Vitamin B-12: 279 pg/mL (ref 180–914)

## 2020-08-28 LAB — SEDIMENTATION RATE: Sed Rate: 20 mm/hr — ABNORMAL HIGH (ref 0–16)

## 2020-08-28 LAB — ETHANOL: Alcohol, Ethyl (B): <10 mg/dL (ref <10)

## 2020-08-28 LAB — HIV ANTIBODY (ROUTINE TESTING W REFLEX): HIV Screen 4th Generation wRfx: NONREACTIVE

## 2020-08-28 LAB — C-REACTIVE PROTEIN: CRP: 1.4 mg/dL — ABNORMAL HIGH (ref <1.0)

## 2020-08-28 LAB — HEMOGLOBIN A1C
Hgb A1c MFr Bld: 6.9 % — ABNORMAL HIGH (ref 4.8–5.6)
Mean Plasma Glucose: 151.33 mg/dL

## 2020-08-28 MED ORDER — ACETAMINOPHEN 160 MG/5ML PO SOLN: 650.0000 mg | ORAL | Status: DC | PRN

## 2020-08-28 MED ORDER — ACETAMINOPHEN 325 MG PO TABS: 650.0000 mg | ORAL_TABLET | ORAL | Status: DC | PRN

## 2020-08-28 MED ORDER — ALBUTEROL SULFATE HFA 108 (90 BASE) MCG/ACT IN AERS: 2.0000 | INHALATION_SPRAY | RESPIRATORY_TRACT | Status: DC | PRN

## 2020-08-28 MED ORDER — HEPARIN SODIUM (PORCINE) 5000 UNIT/ML IJ SOLN
5000.0000 [IU] | Freq: Three times a day (TID) | INTRAMUSCULAR | Status: DC
  Administered 2020-08-28 (×2): 5000 [IU] via SUBCUTANEOUS
  Filled 2020-08-28 (×2): qty 1

## 2020-08-28 MED ORDER — PRAVASTATIN SODIUM 10 MG PO TABS
20.0000 mg | ORAL_TABLET | Freq: Every day | ORAL | Status: DC
  Filled 2020-08-28: qty 2

## 2020-08-28 MED ORDER — HYDROCHLOROTHIAZIDE 12.5 MG PO CAPS
12.5000 mg | ORAL_CAPSULE | Freq: Every day | ORAL | Status: DC
  Administered 2020-08-28: 12.5 mg via ORAL
  Filled 2020-08-28: qty 1

## 2020-08-28 MED ORDER — ATORVASTATIN CALCIUM 40 MG PO TABS: 40.0000 mg | ORAL_TABLET | Freq: Every day | ORAL | 1 refills | Status: AC

## 2020-08-28 MED ORDER — ACETAMINOPHEN 650 MG RE SUPP: 650.0000 mg | RECTAL | Status: DC | PRN

## 2020-08-28 MED ORDER — IRBESARTAN 150 MG PO TABS
150.0000 mg | ORAL_TABLET | Freq: Every day | ORAL | Status: DC
  Administered 2020-08-28: 150 mg via ORAL
  Filled 2020-08-28: qty 1

## 2020-08-28 MED ORDER — OLMESARTAN MEDOXOMIL-HCTZ 20-12.5 MG PO TABS: 1.0000 | ORAL_TABLET | Freq: Every day | ORAL | Status: DC

## 2020-08-28 MED ORDER — ASPIRIN 81 MG PO TBEC: 81.0000 mg | DELAYED_RELEASE_TABLET | Freq: Every day | ORAL | 11 refills | Status: AC

## 2020-08-28 MED ORDER — CLOPIDOGREL BISULFATE 75 MG PO TABS: 75.0000 mg | ORAL_TABLET | Freq: Every day | ORAL | Status: DC

## 2020-08-28 MED ORDER — ASPIRIN EC 81 MG PO TBEC
81.0000 mg | DELAYED_RELEASE_TABLET | Freq: Every day | ORAL | Status: DC
  Administered 2020-08-28: 81 mg via ORAL
  Filled 2020-08-28: qty 1

## 2020-08-28 MED ORDER — GADOBUTROL 1 MMOL/ML IV SOLN
9.0000 mL | Freq: Once | INTRAVENOUS | Status: AC | PRN
  Administered 2020-08-28: 9 mL via INTRAVENOUS

## 2020-08-28 MED ORDER — SENNOSIDES-DOCUSATE SODIUM 8.6-50 MG PO TABS: 1.0000 | ORAL_TABLET | Freq: Every evening | ORAL | Status: DC | PRN

## 2020-08-28 MED ORDER — MOMETASONE FURO-FORMOTEROL FUM 200-5 MCG/ACT IN AERO
2.0000 | INHALATION_SPRAY | Freq: Two times a day (BID) | RESPIRATORY_TRACT | Status: DC
  Administered 2020-08-28: 2 via RESPIRATORY_TRACT
  Filled 2020-08-28: qty 8.8

## 2020-08-28 MED ORDER — STROKE: EARLY STAGES OF RECOVERY BOOK
Freq: Once | Status: DC
  Filled 2020-08-28: qty 1

## 2020-08-28 MED ORDER — CLOPIDOGREL BISULFATE 75 MG PO TABS: 75.0000 mg | ORAL_TABLET | Freq: Every day | ORAL | 0 refills | Status: DC

## 2020-08-28 NOTE — H&P (Signed)
TRH H&P    Patient Demographics:    Scott Chapman, is a 60 y.o. male  MRN: [id number]  DOB - 28-Apr-1960  Admit Date - 08/27/2020  Referring MD/NP/PA: Mesner  Outpatient Primary MD for the patient is Eartha Inch, MD  Patient coming from: Home  Chief complaint- vision loss   HPI:    Scott Chapman  is a 60 y.o. male, with history of hyperlipidemia, hypertension, COPD, presents the ED with a chief complaint of vision loss.  Patient reports that he had acute vision loss in his left eye.  It was like somebody pulled a barrier down over his eye.  His report.  Happened at 7:38 AM on August 17.  He was just doing his normal morning routine.  He had no pain associated with the vision loss.  He went to work and when he was talking about there they told him he had to be evaluated by an eye doctor.  He went to [facility] and they advised him that he had a retinal artery occlusion and gave him a list of stroke work-up items to take to his PCP.  According to patient his wife was quite worried and made him come into the emergency room.  Patient reports that before he had complete vision loss in the left eye and now he has a little keyhole that he can see through.  He reports that he does have high blood pressure and he takes his blood pressure medication every day.  He has high cholesterol he takes a cholesterol medication every day.  He is not a smoker.  He does not monitor his blood pressure at home.  He reports that he thinks he is prediabetic.  Patient has no other complaints at this time.  In the ED Temp 98.4, heart rate 72-96, respiratory 15-26, blood pressure 132/92, satting 97% No leukocytosis with a white blood cell count of 9.5, hemoglobin 15.2 Chemistry panel is mostly unremarkable UA is negative for UTI UDS shows THC and amphetamines CT head is negative Noncon head CT Alcohol levels less than 10 EKG shows a heart  rate of 95, normal sinus rhythm, QTC 414 Admission was requested for stroke work-up    Review of systems:    In addition to the HPI above,  No Fever-chills, No Headache, reports change in vision No problems swallowing food or Liquids, No Chest pain, Cough or Shortness of Breath, No Abdominal pain, No Nausea or Vomiting, bowel movements are regular, No Blood in stool or Urine, No dysuria, No new skin rashes or bruises, No new joints pains-aches,  No new weakness, tingling, numbness in any extremity, No recent weight gain or loss, No polyuria, polydypsia or polyphagia, No significant Mental Stressors.  All other systems reviewed and are negative.    Past History of the following :    Past Medical History:  Diagnosis Date   Allergy    Hyperlipidemia    Hypertension       Past Surgical History:  Procedure Laterality Date   Colapsed lung Right  foot surgery Right    HERNIA REPAIR     KNEE SURGERY Bilateral    left arm surgery     ROTATOR CUFF REPAIR Right    TENDON REPAIR Right    arm      Social History:      Social History   Tobacco Use   Smoking status: Never   Smokeless tobacco: Never  Substance Use Topics   Alcohol use: Yes    Alcohol/week: 0.0 standard drinks       Family History :     Family History  Problem Relation Age of Onset   Stroke Father       Home Medications:   Prior to Admission medications   Medication Sig Start Date End Date Taking? Authorizing Provider  albuterol (PROAIR HFA) 108 (90 Base) MCG/ACT inhaler  06/16/15   [provider]  albuterol (PROVENTIL) (2.5 MG/3ML) 0.083% nebulizer solution  06/19/13   [provider]  clobetasol (OLUX) 0.05 % topical foam APPLY TOPICALLY 2 (TWO) TIMES DAILY. 04/29/15   [provider]  olmesartan-hydrochlorothiazide (BENICAR HCT) 20-12.5 MG tablet  06/10/15   [provider]  pravastatin (PRAVACHOL) 40 MG tablet  12/31/14   [provider]   predniSONE (DELTASONE) 20 MG tablet 2 po at same time daily for 5 days 07/23/15   Dettinger, Elige Radon, MD  SYMBICORT 160-4.5 MCG/ACT inhaler USE 2 PUFFS INTO THE LUNGS 2 TIMES A DAY 06/16/15   [provider]  VIAGRA 100 MG tablet TAKE 1 TABLET (100 MG TOTAL) BY MOUTH AS NEEDED FOR ERECTILE DYSFUNCTION. 06/25/15   [provider]     Allergies:     Allergies  Allergen Reactions   Penicillins Rash    Other reaction(s): Unknown     Physical Exam:   Vitals  Blood pressure (!) 113/98, pulse 92, temperature 98.4 F (36.9 C), resp. rate 20, height 5\' 7"  (1.702 m), weight 90.3 kg, SpO2 95 %.  1.  General: Patient lying supine in bed,  no acute distress   2. Psychiatric: Alert and oriented x 3, mood and behavior normal for situation, pleasant and cooperative with exam   3. Neurologic: Speech and language are normal, face is symmetric, moves all 4 extremities voluntarily, loss of loss of visual field, all of the right visual fields are intact, normal gait,    4. HEENMT:  Head is atraumatic, normocephalic, pupils reactive to light, neck is supple, trachea is midline, mucous membranes are moist   5. Respiratory : Lungs are clear to auscultation bilaterally without wheezing, rhonchi, rales, no cyanosis, no increase in work of breathing or accessory muscle use   6. Cardiovascular : Heart rate normal, rhythm is regular, no murmurs, rubs or gallops, no peripheral edema, peripheral pulses palpated   7. Gastrointestinal:  Abdomen is soft, nondistended, nontender to palpation bowel sounds active, no masses or organomegaly palpated   8. Skin:  Skin is warm, dry and intact without rashes, acute lesions, or ulcers on limited exam   9.Musculoskeletal:  No acute deformities or trauma, no asymmetry in tone, no peripheral edema, peripheral pulses palpated, no tenderness to palpation in the extremities     Data Review:    CBC Recent Labs  Lab 08/27/20 2320 08/28/20 0424   WBC 9.5 8.5  HGB 15.2 14.9  HCT 46.2 47.0  PLT 269 272  MCV 94.1 95.3  MCH 31.0 30.2  MCHC 32.9 31.7  RDW 13.2 13.1  LYMPHSABS 2.0  --   MONOABS  0.7  --   EOSABS 0.1  --   BASOSABS 0.0  --    ------------------------------------------------------------------------------------------------------------------  Results for orders placed or performed during the hospital encounter of 08/27/20 (from the past 48 hour(s))  Resp Panel by RT-PCR (Flu A&B, Covid) Nasopharyngeal Swab     Status: None   Collection Time: 08/27/20 12:12 AM   Specimen: Nasopharyngeal Swab; Nasopharyngeal(NP) swabs in vial transport medium  Result Value Ref Range   SARS Coronavirus 2 by RT PCR NEGATIVE NEGATIVE    Comment: (NOTE) SARS-CoV-2 target nucleic acids are NOT DETECTED.  The SARS-CoV-2 RNA is generally detectable in upper respiratory specimens during the acute phase of infection. The lowest concentration of SARS-CoV-2 viral copies this assay can detect is 138 copies/mL. A negative result does not preclude SARS-Cov-2 infection and should not be used as the sole basis for treatment or other patient management decisions. A negative result may occur with  improper specimen collection/handling, submission of specimen other than nasopharyngeal swab, presence of viral mutation(s) within the areas targeted by this assay, and inadequate number of viral copies(<138 copies/mL). A negative result must be combined with clinical observations, patient history, and epidemiological information. The expected result is Negative.  Fact Sheet for Patients:  BloggerCourse.com  Fact Sheet for Healthcare Providers:  SeriousBroker.it  This test is no t yet approved or cleared by the [place] FDA and  has been authorized for detection and/or diagnosis of SARS-CoV-2 by FDA under an Emergency Use Authorization (EUA). This EUA will remain  in effect (meaning this test  can be used) for the duration of the COVID-19 declaration under Section 564(b)(1) of the Act, 21 U.S.C.section 360bbb-3(b)(1), unless the authorization is terminated  or revoked sooner.       Influenza A by PCR NEGATIVE NEGATIVE   Influenza B by PCR NEGATIVE NEGATIVE    Comment: (NOTE) The Xpert Xpress SARS-CoV-2/FLU/RSV plus assay is intended as an aid in the diagnosis of influenza from Nasopharyngeal swab specimens and should not be used as a sole basis for treatment. Nasal washings and aspirates are unacceptable for Xpert Xpress SARS-CoV-2/FLU/RSV testing.  Fact Sheet for Patients: BloggerCourse.com  Fact Sheet for Healthcare Providers: SeriousBroker.it  This test is not yet approved or cleared by the [place] FDA and has been authorized for detection and/or diagnosis of SARS-CoV-2 by FDA under an Emergency Use Authorization (EUA). This EUA will remain in effect (meaning this test can be used) for the duration of the COVID-19 declaration under Section 564(b)(1) of the Act, 21 U.S.C. section 360bbb-3(b)(1), unless the authorization is terminated or revoked.  Performed at [facility], [address] [place], [place] [address]   Ethanol     Status: None   Collection Time: 08/27/20 11:20 PM  Result Value Ref Range   Alcohol, Ethyl (B) <10 <10 mg/dL    Comment: (NOTE) Lowest detectable limit for serum alcohol is 10 mg/dL.  For medical purposes only. Performed at [facility], [address], [place], [place] [address]   Protime-INR     Status: None   Collection Time: 08/27/20 11:20 PM  Result Value Ref Range   Prothrombin Time 12.4 11.4 - 15.2 seconds   INR 0.9 0.8 - 1.2    Comment: (NOTE) INR goal varies based on device and disease states. Performed at [facility], [address] [place], [place] [address]   APTT     Status: None   Collection Time: 08/27/20 11:20 PM  Result Value Ref Range   aPTT  29 24 - 36  seconds    Comment: Performed at [facility], [address] [place], [place] [address]  CBC     Status: None   Collection Time: 08/27/20 11:20 PM  Result Value Ref Range   WBC 9.5 4.0 - 10.5 K/uL   RBC 4.91 4.22 - 5.81 MIL/uL   Hemoglobin 15.2 13.0 - 17.0 g/dL   HCT 28.3 15.1 - 76.1 %   MCV 94.1 80.0 - 100.0 fL   MCH 31.0 26.0 - 34.0 pg   MCHC 32.9 30.0 - 36.0 g/dL   RDW 60.7 37.1 - 06.2 %   Platelets 269 150 - 400 K/uL   nRBC 0.0 0.0 - 0.2 %    Comment: Performed at [facility], [address], [place], [place] [address]  Differential     Status: Abnormal   Collection Time: 08/27/20 11:20 PM  Result Value Ref Range   Neutrophils Relative % 70 %   Neutro Abs 6.6 1.7 - 7.7 K/uL   Lymphocytes Relative 21 %   Lymphs Abs 2.0 0.7 - 4.0 K/uL   Monocytes Relative 7 %   Monocytes Absolute 0.7 0.1 - 1.0 K/uL   Eosinophils Relative 1 %   Eosinophils Absolute 0.1 0.0 - 0.5 K/uL   Basophils Relative 0 %   Basophils Absolute 0.0 0.0 - 0.1 K/uL   Immature Granulocytes 1 %   Abs Immature Granulocytes 0.08 (H) 0.00 - 0.07 K/uL    Comment: Performed at [facility] [address] [place], [place] [address]  Comprehensive metabolic panel     Status: Abnormal   Collection Time: 08/27/20 11:20 PM  Result Value Ref Range   Sodium 137 135 - 145 mmol/L   Potassium 4.1 3.5 - 5.1 mmol/L   Chloride 101 98 - 111 mmol/L   CO2 25 22 - 32 mmol/L   Glucose, Bld 113 (H) 70 - 99 mg/dL    Comment: Glucose reference range applies only to samples taken after fasting for at least 8 hours.   BUN 21 (H) 6 - 20 mg/dL   Creatinine, Ser 3.50 0.61 - 1.24 mg/dL   Calcium 9.7 8.9 - 09.3 mg/dL   Total Protein 7.1 6.5 - 8.1 g/dL   Albumin 3.8 3.5 - 5.0 g/dL   AST 24 15 - 41 U/L   ALT 34 0 - 44 U/L   Alkaline Phosphatase 62 38 - 126 U/L   Total Bilirubin 0.6 0.3 - 1.2 mg/dL   GFR, Estimated >81 >82 mL/min    Comment: (NOTE) Calculated using the CKD-EPI Creatinine Equation (2021)    Anion gap 11 5 - 15     Comment: Performed at [facility], [address], [place], [place] [address]  Urinalysis, Routine w reflex microscopic Urine, Clean Catch     Status: Abnormal   Collection Time: 08/28/20 12:12 AM  Result Value Ref Range   Color, Urine AMBER (A) YELLOW    Comment: BIOCHEMICALS MAY BE AFFECTED BY COLOR   APPearance TURBID (A) CLEAR   Specific Gravity, Urine 1.019 1.005 - 1.030   pH 7.0 5.0 - 8.0   Glucose, UA NEGATIVE NEGATIVE mg/dL   Hgb urine dipstick NEGATIVE NEGATIVE   Bilirubin Urine NEGATIVE NEGATIVE   Ketones, ur NEGATIVE NEGATIVE mg/dL   Protein, ur NEGATIVE NEGATIVE mg/dL   Nitrite NEGATIVE NEGATIVE   Leukocytes,Ua NEGATIVE NEGATIVE   RBC / HPF 0-5 0 - 5 RBC/hpf   WBC, UA 0-5 0 - 5 WBC/hpf   Bacteria,  UA NONE SEEN NONE SEEN   Mucus PRESENT    Amorphous Crystal PRESENT     Comment: Performed at [facility], [address] [place], [place] [address]  Urine rapid drug screen (hosp performed)     Status: Abnormal   Collection Time: 08/28/20 12:17 AM  Result Value Ref Range   Opiates NONE DETECTED NONE DETECTED   Cocaine NONE DETECTED NONE DETECTED   Benzodiazepines NONE DETECTED NONE DETECTED   Amphetamines POSITIVE (A) NONE DETECTED   Tetrahydrocannabinol POSITIVE (A) NONE DETECTED   Barbiturates NONE DETECTED NONE DETECTED    Comment: (NOTE) DRUG SCREEN FOR MEDICAL PURPOSES ONLY.  IF CONFIRMATION IS NEEDED FOR ANY PURPOSE, NOTIFY LAB WITHIN 5 DAYS.  LOWEST DETECTABLE LIMITS FOR URINE DRUG SCREEN Drug Class                     Cutoff (ng/mL) Amphetamine and metabolites    1000 Barbiturate and metabolites    200 Benzodiazepine                 200 Tricyclics and metabolites     300 Opiates and metabolites        300 Cocaine and metabolites        300 THC                            50 Performed at [facility], [address] [place], [place] [address]   CBC     Status: None   Collection Time: 08/28/20  4:24 AM  Result Value Ref Range   WBC 8.5 4.0 - 10.5 K/uL    RBC 4.93 4.22 - 5.81 MIL/uL   Hemoglobin 14.9 13.0 - 17.0 g/dL   HCT 76.7 34.1 - 93.7 %   MCV 95.3 80.0 - 100.0 fL   MCH 30.2 26.0 - 34.0 pg   MCHC 31.7 30.0 - 36.0 g/dL   RDW 90.2 40.9 - 73.5 %   Platelets 272 150 - 400 K/uL   nRBC 0.0 0.0 - 0.2 %    Comment: Performed at [facility], [address], [place], [place] [address]  Comprehensive metabolic panel     Status: Abnormal   Collection Time: 08/28/20  4:24 AM  Result Value Ref Range   Sodium 138 135 - 145 mmol/L   Potassium 4.4 3.5 - 5.1 mmol/L   Chloride 99 98 - 111 mmol/L   CO2 30 22 - 32 mmol/L   Glucose, Bld 114 (H) 70 - 99 mg/dL    Comment: Glucose reference range applies only to samples taken after fasting for at least 8 hours.   BUN 20 6 - 20 mg/dL   Creatinine, Ser 4.26 0.61 - 1.24 mg/dL   Calcium 9.8 8.9 - 83.4 mg/dL   Total Protein 6.8 6.5 - 8.1 g/dL   Albumin 3.7 3.5 - 5.0 g/dL   AST 21 15 - 41 U/L   ALT 34 0 - 44 U/L   Alkaline Phosphatase 56 38 - 126 U/L   Total Bilirubin 0.7 0.3 - 1.2 mg/dL   GFR, Estimated >19 >62 mL/min    Comment: (NOTE) Calculated using the CKD-EPI Creatinine Equation (2021)    Anion gap 9 5 - 15    Comment: Performed at [facility], [address], [place], [place] [address]  Magnesium     Status: None   Collection Time: 08/28/20  4:24 AM  Result Value Ref Range  Magnesium 2.1 1.7 - 2.4 mg/dL    Comment: Performed at [facility], [address] [place], [place] [address]    Chemistries  Recent Labs  Lab 08/27/20 2320 08/28/20 0424  NA 137 138  K 4.1 4.4  CL 101 99  CO2 25 30  GLUCOSE 113* 114*  BUN 21* 20  CREATININE 0.90 0.88  CALCIUM 9.7 9.8  MG  --  2.1  AST 24 21  ALT 34 34  ALKPHOS 62 56  BILITOT 0.6 0.7   ------------------------------------------------------------------------------------------------------------------  ------------------------------------------------------------------------------------------------------------------ GFR: Estimated  Creatinine Clearance: 99.3 mL/min (by C-G formula based on SCr of 0.88 mg/dL). Liver Function Tests: Recent Labs  Lab 08/27/20 2320 08/28/20 0424  AST 24 21  ALT 34 34  ALKPHOS 62 56  BILITOT 0.6 0.7  PROT 7.1 6.8  ALBUMIN 3.8 3.7   No results for input(s): LIPASE, AMYLASE in the last 168 hours. No results for input(s): AMMONIA in the last 168 hours. Coagulation Profile: Recent Labs  Lab 08/27/20 2320  INR 0.9   Cardiac Enzymes: No results for input(s): CKTOTAL, CKMB, CKMBINDEX, TROPONINI in the last 168 hours. BNP (last 3 results) No results for input(s): PROBNP in the last 8760 hours. HbA1C: No results for input(s): HGBA1C in the last 72 hours. CBG: No results for input(s): GLUCAP in the last 168 hours. Lipid Profile: No results for input(s): CHOL, HDL, LDLCALC, TRIG, CHOLHDL, LDLDIRECT in the last 72 hours. Thyroid Function Tests: No results for input(s): TSH, T4TOTAL, FREET4, T3FREE, THYROIDAB in the last 72 hours. Anemia Panel: No results for input(s): VITAMINB12, FOLATE, FERRITIN, TIBC, IRON, RETICCTPCT in the last 72 hours.  --------------------------------------------------------------------------------------------------------------- Urine analysis:    Component Value Date/Time   COLORURINE AMBER (A) 08/28/2020 0012   APPEARANCEUR TURBID (A) 08/28/2020 0012   LABSPEC 1.019 08/28/2020 0012   PHURINE 7.0 08/28/2020 0012   GLUCOSEU NEGATIVE 08/28/2020 0012   HGBUR NEGATIVE 08/28/2020 0012   BILIRUBINUR NEGATIVE 08/28/2020 0012   KETONESUR NEGATIVE 08/28/2020 0012   PROTEINUR NEGATIVE 08/28/2020 0012   NITRITE NEGATIVE 08/28/2020 0012   LEUKOCYTESUR NEGATIVE 08/28/2020 0012      Imaging Results:    CT HEAD WO CONTRAST  Result Date: 08/27/2020 CLINICAL DATA:  Neuro deficit, acute, stroke suspected Left eye vision loss. EXAM: CT HEAD WITHOUT CONTRAST TECHNIQUE: Contiguous axial images were obtained from the base of the skull through the vertex without  intravenous contrast. COMPARISON:  None. FINDINGS: Brain: No intracranial hemorrhage, mass effect, or midline shift. No hydrocephalus. The basilar cisterns are patent. No evidence of territorial infarct or acute ischemia. No extra-axial or intracranial fluid collection. Vascular: No hyperdense vessel. Skull: No fracture or focal lesion. Sinuses/Orbits: Paranasal sinuses and mastoid air cells are clear. The visualized orbits are unremarkable. No evidence of globe abnormality. Other: None. IMPRESSION: Negative noncontrast head CT. Electronically Signed   By: Narda RutherfordMelanie  Sanford M.D.   On: 08/27/2020 23:48       Assessment & Plan:    Active Problems:   CVA (cerebral vascular accident) (HCC)   CVA/retinal artery occlusion Check lipid panel Check hemoglobin A1c Continue blood pressure and hyperlipidemia control PT/OT/ST eval and treat MRI of the brain Ultrasound of the carotids, echo  Consult to neuro Hypertension Continue Benicar Hyperlipidemia Continue statin COPD Continue albuterol and Symbicort    DVT Prophylaxis-   Heparin- SCDs   AM Labs Ordered, also please review Full Orders  Family Communication: No family at bedside  Code Status: Full  Admission status: Observation  Time  spent in minutes : 65   Rodrigus Kilker B Zierle-Ghosh DO

## 2020-08-28 NOTE — ED Notes (Signed)
Pt and family at bedside educated on stroke protocols and admission process.

## 2020-08-28 NOTE — Consult Note (Signed)
[facility] Fredonia Casalino A. Gerilyn Pilgrim, MD     [email or web address]          Scott Chapman is an 60 y.o. male.   ASSESSMENT/PLAN: ACUTE ISCHEMIC NON-ARTERTIS OPTIC NEUROPATHY: Dual antiplatelet agents are recommended for 1 month and then subsequently a single agent preferably aspirin. The patient also should have his blood pressure and lipids control. Appears that he may also be diabetic and this will be treated. 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  The patient presents with the acute onset of a grain of his vision involving the left eye. He went to see an optometrist and was told that he had acute stroke. The patient does not really reports other associated symptoms. He denies diplopia, dysarthria, dysphagia, palpitation, chest pain or shortness of breath. He does not report focal numbness or weakness. His symptoms have remained unchanged with marked impairment of vision involving the left eye. He does not report temporal soreness or headaches. The review systems otherwise negative.  GENERAL: This is a pleasant male who is doing well at this time. He is markedly obese.  HEENT: Neck is supple no trauma noted.  ABDOMEN: Soft  EXTREMITIES: No edema   BACK: Normal alignment.  SKIN: Normal by inspection.    MENTAL STATUS: Alert and oriented. Speech, language and cognition are generally intact. Judgment and insight normal.   CRANIAL NERVES: Pupils are equal, round and reactive to light; significantly impaired vision on the left but visual fields are normal involving the right eye. The extraocular movements are full, there is no significant nystagmus; upper and lower facial muscles are normal in strength and symmetric, there is no flattening of the nasolabial folds; tongue is midline; uvula is midline; shoulder elevation is normal.  MOTOR: Normal tone, bulk and strength; no pronator drift. There is no drift of the upper lower  extremities.  COORDINATION: Left finger to nose is normal, right finger to nose is normal, No rest tremor; no intention tremor; no postural tremor; no bradykinesia.  REFLEXES: Deep tendon reflexes are symmetrical and normal. Plantar responses are flexor bilaterally.   SENSATION: Normal to light touch and temperature. There is no extinction on double simultaneous stimulation.    Blood pressure 130/89, pulse 95, temperature 98.2 F (36.8 C), temperature source Oral, resp. rate 18, height 5\' 7"  (1.702 m), weight 90.3 kg, SpO2 99 %.  Past Medical History:  Diagnosis Date   Allergy    Hyperlipidemia    Hypertension     Past Surgical History:  Procedure Laterality Date   Colapsed lung Right    foot surgery Right    HERNIA REPAIR     KNEE SURGERY Bilateral    left arm surgery     ROTATOR CUFF REPAIR Right    TENDON REPAIR Right    arm    Family History  Problem Relation Age of Onset   Stroke Father     Social History:  reports that he has never smoked. He has never used smokeless tobacco. He reports current alcohol use. He reports that he does not use drugs.  Allergies:  Allergies  Allergen Reactions   Penicillins Rash    Other reaction(s): Unknown    Medications: Prior to Admission medications   Medication Sig Start Date End Date Taking? Authorizing Provider  albuterol (PROVENTIL) (2.5 MG/3ML) 0.083% nebulizer solution Take 2.5 mg by nebulization every 6 (six) hours as needed for wheezing or shortness of breath. 06/19/13  Yes [provider]  cyclobenzaprine (FLEXERIL) 5 MG tablet Take 5 mg by mouth daily as needed for muscle spasms. 08/15/20  Yes [provider]  esomeprazole (NEXIUM) 20 MG capsule Take 20 mg by mouth daily as needed (indigestion).   Yes [provider]  fluticasone (FLONASE) 50 MCG/ACT nasal spray Place 2 sprays into both nostrils daily.   Yes [provider]  olmesartan-hydrochlorothiazide (BENICAR HCT) 40-25 MG tablet  Take 1 tablet by mouth daily.   Yes [provider]  pravastatin (PRAVACHOL) 40 MG tablet Take 40 mg by mouth daily. 12/31/14  Yes [provider]  SYMBICORT 160-4.5 MCG/ACT inhaler Inhale 2 puffs into the lungs in the morning and at bedtime. 06/16/15  Yes [provider]  VIAGRA 100 MG tablet Take 100 mg by mouth as needed for erectile dysfunction. 06/25/15  Yes [provider]  predniSONE (DELTASONE) 20 MG tablet 2 po at same time daily for 5 days Patient not taking: No sig reported 07/23/15   Dettinger, Elige Radon, MD    Scheduled Meds:   stroke: mapping our early stages of recovery book   Does not apply Once   aspirin EC  81 mg Oral Daily   heparin  5,000 Units Subcutaneous Q8H   hydrochlorothiazide  12.5 mg Oral Daily   irbesartan  150 mg Oral Daily   mometasone-formoterol  2 puff Inhalation BID   pravastatin  20 mg Oral Daily   Continuous Infusions: PRN Meds:.acetaminophen **OR** acetaminophen (TYLENOL) oral liquid 160 mg/5 mL **OR** acetaminophen, albuterol, senna-docusate     Results for orders placed or performed during the hospital encounter of 08/27/20 (from the past 48 hour(s))  Resp Panel by RT-PCR (Flu A&B, Covid) Nasopharyngeal Swab     Status: None   Collection Time: 08/27/20 12:12 AM   Specimen: Nasopharyngeal Swab; Nasopharyngeal(NP) swabs in vial transport medium  Result Value Ref Range   SARS Coronavirus 2 by RT PCR NEGATIVE NEGATIVE    Comment: (NOTE) SARS-CoV-2 target nucleic acids are NOT DETECTED.  The SARS-CoV-2 RNA is generally detectable in upper respiratory specimens during the acute phase of infection. The lowest concentration of SARS-CoV-2 viral copies this assay can detect is 138 copies/mL. A negative result does not preclude SARS-Cov-2 infection and should not be used as the sole basis for treatment or other patient management decisions. A negative result may occur with  improper specimen collection/handling,  submission of specimen other than nasopharyngeal swab, presence of viral mutation(s) within the areas targeted by this assay, and inadequate number of viral copies(<138 copies/mL). A negative result must be combined with clinical observations, patient history, and epidemiological information. The expected result is Negative.  Fact Sheet for Patients:  BloggerCourse.com  Fact Sheet for Healthcare Providers:  SeriousBroker.it  This test is no t yet approved or cleared by the [place] FDA and  has been authorized for detection and/or diagnosis of SARS-CoV-2 by FDA under an Emergency Use Authorization (EUA). This EUA will remain  in effect (meaning this test can be used) for the duration of the COVID-19 declaration under Section 564(b)(1) of the Act, 21 U.S.C.section 360bbb-3(b)(1), unless the authorization is terminated  or revoked sooner.       Influenza A by PCR NEGATIVE NEGATIVE   Influenza B by PCR NEGATIVE NEGATIVE    Comment: (NOTE) The Xpert Xpress SARS-CoV-2/FLU/RSV plus assay is intended as an aid in the diagnosis of influenza from Nasopharyngeal swab specimens and should not be used as a sole basis for treatment. Nasal washings and aspirates  are unacceptable for Xpert Xpress SARS-CoV-2/FLU/RSV testing.  Fact Sheet for Patients: BloggerCourse.comhttps://www.fda.gov/media/152166/download  Fact Sheet for Healthcare Providers: SeriousBroker.ithttps://www.fda.gov/media/152162/download  This test is not yet approved or cleared by the [place] FDA and has been authorized for detection and/or diagnosis of SARS-CoV-2 by FDA under an Emergency Use Authorization (EUA). This EUA will remain in effect (meaning this test can be used) for the duration of the COVID-19 declaration under Section 564(b)(1) of the Act, 21 U.S.C. section 360bbb-3(b)(1), unless the authorization is terminated or revoked.  Performed at [facility], [address]  [place], [place] [address]   Ethanol     Status: None   Collection Time: 08/27/20 11:20 PM  Result Value Ref Range   Alcohol, Ethyl (B) <10 <10 mg/dL    Comment: (NOTE) Lowest detectable limit for serum alcohol is 10 mg/dL.  For medical purposes only. Performed at [facility], [address] [place], [place] [address]   Protime-INR     Status: None   Collection Time: 08/27/20 11:20 PM  Result Value Ref Range   Prothrombin Time 12.4 11.4 - 15.2 seconds   INR 0.9 0.8 - 1.2    Comment: (NOTE) INR goal varies based on device and disease states. Performed at [facility], [address] [place], [place] [address]   APTT     Status: None   Collection Time: 08/27/20 11:20 PM  Result Value Ref Range   aPTT 29 24 - 36 seconds    Comment: Performed at [facility], [address] [place], [place] [address]  CBC     Status: None   Collection Time: 08/27/20 11:20 PM  Result Value Ref Range   WBC 9.5 4.0 - 10.5 K/uL   RBC 4.91 4.22 - 5.81 MIL/uL   Hemoglobin 15.2 13.0 - 17.0 g/dL   HCT 29.546.2 62.139.0 - 30.852.0 %   MCV 94.1 80.0 - 100.0 fL   MCH 31.0 26.0 - 34.0 pg   MCHC 32.9 30.0 - 36.0 g/dL   RDW 65.713.2 84.611.5 - 96.215.5 %   Platelets 269 150 - 400 K/uL   nRBC 0.0 0.0 - 0.2 %    Comment: Performed at [facility], [address] [place], [place] [address]  Differential     Status: Abnormal   Collection Time: 08/27/20 11:20 PM  Result Value Ref Range   Neutrophils Relative % 70 %   Neutro Abs 6.6 1.7 - 7.7 K/uL   Lymphocytes Relative 21 %   Lymphs Abs 2.0 0.7 - 4.0 K/uL   Monocytes Relative 7 %   Monocytes Absolute 0.7 0.1 - 1.0 K/uL   Eosinophils Relative 1 %   Eosinophils Absolute 0.1 0.0 - 0.5 K/uL   Basophils Relative 0 %   Basophils Absolute 0.0 0.0 - 0.1 K/uL   Immature Granulocytes 1 %   Abs Immature Granulocytes 0.08 (H) 0.00 - 0.07 K/uL    Comment: Performed at [facility], [address] [place], [place] [address]  Comprehensive metabolic panel     Status: Abnormal   Collection  Time: 08/27/20 11:20 PM  Result Value Ref Range   Sodium 137 135 - 145 mmol/L   Potassium 4.1 3.5 - 5.1 mmol/L   Chloride 101 98 - 111 mmol/L   CO2 25 22 - 32 mmol/L   Glucose, Bld 113 (H) 70 - 99 mg/dL    Comment: Glucose reference range applies only to samples taken after fasting for at least 8 hours.   BUN 21 (H) 6 -  20 mg/dL   Creatinine, Ser 6.96 0.61 - 1.24 mg/dL   Calcium 9.7 8.9 - 78.9 mg/dL   Total Protein 7.1 6.5 - 8.1 g/dL   Albumin 3.8 3.5 - 5.0 g/dL   AST 24 15 - 41 U/L   ALT 34 0 - 44 U/L   Alkaline Phosphatase 62 38 - 126 U/L   Total Bilirubin 0.6 0.3 - 1.2 mg/dL   GFR, Estimated >38 >10 mL/min    Comment: (NOTE) Calculated using the CKD-EPI Creatinine Equation (2021)    Anion gap 11 5 - 15    Comment: Performed at [facility], [address], [place], [place] [address]  Urinalysis, Routine w reflex microscopic Urine, Clean Catch     Status: Abnormal   Collection Time: 08/28/20 12:12 AM  Result Value Ref Range   Color, Urine AMBER (A) YELLOW    Comment: BIOCHEMICALS MAY BE AFFECTED BY COLOR   APPearance TURBID (A) CLEAR   Specific Gravity, Urine 1.019 1.005 - 1.030   pH 7.0 5.0 - 8.0   Glucose, UA NEGATIVE NEGATIVE mg/dL   Hgb urine dipstick NEGATIVE NEGATIVE   Bilirubin Urine NEGATIVE NEGATIVE   Ketones, ur NEGATIVE NEGATIVE mg/dL   Protein, ur NEGATIVE NEGATIVE mg/dL   Nitrite NEGATIVE NEGATIVE   Leukocytes,Ua NEGATIVE NEGATIVE   RBC / HPF 0-5 0 - 5 RBC/hpf   WBC, UA 0-5 0 - 5 WBC/hpf   Bacteria, UA NONE SEEN NONE SEEN   Mucus PRESENT    Amorphous Crystal PRESENT     Comment: Performed at [facility], [address] [place], [place] [address]  Urine rapid drug screen (hosp performed)     Status: Abnormal   Collection Time: 08/28/20 12:17 AM  Result Value Ref Range   Opiates NONE DETECTED NONE DETECTED   Cocaine NONE DETECTED NONE DETECTED   Benzodiazepines NONE DETECTED NONE DETECTED   Amphetamines POSITIVE (A) NONE DETECTED   Tetrahydrocannabinol  POSITIVE (A) NONE DETECTED   Barbiturates NONE DETECTED NONE DETECTED    Comment: (NOTE) DRUG SCREEN FOR MEDICAL PURPOSES ONLY.  IF CONFIRMATION IS NEEDED FOR ANY PURPOSE, NOTIFY LAB WITHIN 5 DAYS.  LOWEST DETECTABLE LIMITS FOR URINE DRUG SCREEN Drug Class                     Cutoff (ng/mL) Amphetamine and metabolites    1000 Barbiturate and metabolites    200 Benzodiazepine                 200 Tricyclics and metabolites     300 Opiates and metabolites        300 Cocaine and metabolites        300 THC                            50 Performed at [facility], [address] [place], [place] [address]   CBC     Status: None   Collection Time: 08/28/20  4:24 AM  Result Value Ref Range   WBC 8.5 4.0 - 10.5 K/uL   RBC 4.93 4.22 - 5.81 MIL/uL   Hemoglobin 14.9 13.0 - 17.0 g/dL   HCT 23.5 36.1 - 44.3 %   MCV 95.3 80.0 - 100.0 fL   MCH 30.2 26.0 - 34.0 pg   MCHC 31.7 30.0 - 36.0 g/dL   RDW 15.4 00.8 - 67.6 %   Platelets 272 150 - 400 K/uL   nRBC 0.0  0.0 - 0.2 %    Comment: Performed at [facility], [address] [place], [place] [address]  Comprehensive metabolic panel     Status: Abnormal   Collection Time: 08/28/20  4:24 AM  Result Value Ref Range   Sodium 138 135 - 145 mmol/L   Potassium 4.4 3.5 - 5.1 mmol/L   Chloride 99 98 - 111 mmol/L   CO2 30 22 - 32 mmol/L   Glucose, Bld 114 (H) 70 - 99 mg/dL    Comment: Glucose reference range applies only to samples taken after fasting for at least 8 hours.   BUN 20 6 - 20 mg/dL   Creatinine, Ser 6.04 0.61 - 1.24 mg/dL   Calcium 9.8 8.9 - 54.0 mg/dL   Total Protein 6.8 6.5 - 8.1 g/dL   Albumin 3.7 3.5 - 5.0 g/dL   AST 21 15 - 41 U/L   ALT 34 0 - 44 U/L   Alkaline Phosphatase 56 38 - 126 U/L   Total Bilirubin 0.7 0.3 - 1.2 mg/dL   GFR, Estimated >98 >11 mL/min    Comment: (NOTE) Calculated using the CKD-EPI Creatinine Equation (2021)    Anion gap 9 5 - 15    Comment: Performed at [facility], [address]  [place], [place] [address]  Magnesium     Status: None   Collection Time: 08/28/20  4:24 AM  Result Value Ref Range   Magnesium 2.1 1.7 - 2.4 mg/dL    Comment: Performed at [facility], [address] [place], [place] [address]  Lipid panel     Status: Abnormal   Collection Time: 08/28/20  4:24 AM  Result Value Ref Range   Cholesterol 204 (H) 0 - 200 mg/dL   Triglycerides 130 <865 mg/dL   HDL 55 >78 mg/dL   Total CHOL/HDL Ratio 3.7 RATIO   VLDL 26 0 - 40 mg/dL   LDL Cholesterol 469 (H) 0 - 99 mg/dL    Comment:        Total Cholesterol/HDL:CHD Risk Coronary Heart Disease Risk Table                     Men   Women  1/2 Average Risk   3.4   3.3  Average Risk       5.0   4.4  2 X Average Risk   9.6   7.1  3 X Average Risk  23.4   11.0        Use the calculated Patient Ratio above and the CHD Risk Table to determine the patient's CHD Risk.        ATP III CLASSIFICATION (LDL):  <100     mg/dL   Optimal  629-528  mg/dL   Near or Above                    Optimal  130-159  mg/dL   Borderline  413-244  mg/dL   High  >010     mg/dL   Very High Performed at [facility], [address] [place], [place] [address]     Studies/Results:  CAROTID  IMPRESSION: 1. Bilateral carotid bifurcation plaque resulting in less than 50% diameter ICA stenosis. 2. Antegrade bilateral vertebral arterial flow.      BRAIN MRI FINDINGS: Brain: No acute infarction, hemorrhage, hydrocephalus, extra-axial collection or mass lesion. Few remote white matter insults, medical history and age suggesting microvascular ischemia. Small remote left frontal parietal cortex infarct. Brain volume  is normal. No abnormality seen along the optic pathways.   Vascular: Normal flow voids, including in the left carotid.   Skull and upper cervical spine: Normal marrow signal   Sinuses/Orbits: Negative. No evidence of intra-ocular collection or optic nerve compression.   IMPRESSION: 1. No acute finding or  explanation for symptoms. 2. Mild chronic ischemic changes including a small remote left frontal parietal cortex infarct.     MRA MRI NECK HEAD FINDINGS: MRA NECK FINDINGS   Aortic arch: Normal.   Right carotid system: There is no hemodynamically significant stenosis or occlusion. There is no dissection or aneurysm.   Left carotid system: There is no hemodynamically significant stenosis or occlusion. There is no dissection or aneurysm.   Vertebral arteries: Both vertebral arteries are patent with antegrade flow.   Veins: The jugular veins appear patent.   MRA HEAD FINDINGS   Anterior circulation: The bilateral cavernous ICAs are patent. The bilateral ACAs and MCAs are patent. No aneurysm or AVM is identified.   Posterior circulation: The V4 segments of the vertebral arteries are patent. The basilar artery is patent. The bilateral PCAs are patent. There is no aneurysm or AVM.   IMPRESSION: Patent vasculature of the head and neck. No significant stenosis, occlusion, dissection, or aneurysm.        MRI IS REVIEWED IN PERSON AND SHOWS ACUTE STROKES. There is a small remote cortical involving the posterior frontal lobe. There is mild chronic microvascular changes on FLAIR imaging. No hemorrhages noted.     Haidyn Kilburg A. Gerilyn Pilgrim, M.D.  Diplomate, Biomedical engineer of Psychiatry and Neurology ( Neurology). 08/28/2020, 4:05 PM

## 2020-08-28 NOTE — Progress Notes (Signed)
*  PRELIMINARY RESULTS* Echocardiogram 2D Echocardiogram has been performed.  Carolyne Fiscal 08/28/2020, 12:55 PM

## 2020-08-28 NOTE — Progress Notes (Signed)
  August 28, 2020  Patient: Scott Chapman  Date of Birth: 1960-04-29  Date of Visit: 08/27/2020    To Whom It May Concern:  Scott Chapman was seen and treated at [facility] on 08/27/2020. DOMINQUE MARLIN  may return to work on 09/08/20 .  Sincerely,       Jillene Bucks, RN

## 2020-08-28 NOTE — Progress Notes (Signed)
SLP Cancellation Note  Patient Details Name: Scott Chapman MRN: [id number] DOB: Dec 02, 1960   Cancelled treatment:       Reason Eval/Treat Not Completed: SLP screened, no needs identified, will sign off. Speech-language and cognition are at baseline. ST will sign off at this time. Thank you,  Summer Mccolgan H. Romie Levee, CCC-SLP Speech Language Pathologist    Georgetta Haber 08/28/2020, 3:38 PM

## 2020-08-28 NOTE — Progress Notes (Signed)
PROGRESS NOTE  Brief Narrative: Scott Chapman is a 60 y.o. male with a history of HTN, HLD, COPD, ADHD who presented to the ED last night with ongoing painless visual field deficit in the left eye. This first occurred in the morning, abrupt, affecting the left eye. He went to [place] for evaluation where he was told he had a retinal artery occlusion and told to proceed to his PCP for work up but his wife made him come to the ED. CTA showed no acute infarcts, though teleneurology recommended admission for stroke work up and starting aspirin. MRI/MRA revealed a small remote left frontal/parietal cortical infarct on background of mild ischemic changes but no acute finding and patent vasculature. U/S revealed bilateral plaques at carotid bifurcations resulting in < 50% ICA stenosis. He was admitted this morning and is awaiting formal neurology evaluation.  Subjective: His symptoms continue unchanged.   Objective: BP 139/77   Pulse 98   Temp 98.2 F (36.8 C) (Oral)   Resp 18   Ht 5\' 7"  (1.702 m)   Wt 90.3 kg   SpO2 99%   BMI 31.17 kg/m   Gen: Nontoxic male in no distress Pulm: Clear and nonlabored on room air  CV: RRR, no murmur, no JVD, no edema GI: Soft, NT, ND, +BS  Neuro: Alert and oriented. Abnormal visual field to confrontation on left eye only. No other focal deficits. 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 No PT or OT follow up is recommended.  - Risk factor modification recommended as detailed below.   Remote left frontal/parietal cortical infarct: noted on MRI with some small vessel disease and no acute stroke.  - Start aspirin (no antiplatelet PTA). Discussed with Dr. who will evaluate the patient to make final recommendations regarding continued work up.  - Augment statin from pravastatin 40mg  > atorvastatin 40mg , titrate to LDL <70 and as tolerated. LDL 123.  -  Follow up with PCP for diabetes management. HbA1c 6.9%. Will discuss starting metformin with the patient - Echocardiogram still pending to evaluate for cardioembolic source  New diagnosis T2DM: HbA1c 6.9% diagnostic.  - Will benefit from education and PCP follow up.   Obesity: Lifestyle modifications reviewed.   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 Cessation recommended  ADHD:  - Continue home adderall which is likely cause of +amphetamine on UDS.   Asthma: No flare currently.  - Continue home medication.  Gerilyn Pilgrim, MD Pager on amion 08/28/2020, 5:03 PM

## 2020-08-28 NOTE — ED Notes (Signed)
Patient transported to MRI 

## 2020-08-28 NOTE — Evaluation (Addendum)
Physical Therapy Evaluation Patient Details Name: Scott Chapman MRN: [id number] DOB: Mar 31, 1960 Today's Date: 08/28/2020   History of Present Illness  Scott Chapman  is a 60 y.o. male, with history of hyperlipidemia, hypertension, COPD, presents the ED with a chief complaint of vision loss.  Patient reports that he had acute vision loss in his left eye.  It was like somebody pulled a barrier down over his eye.  His report.  Happened at 7:38 AM on August 17.  He was just doing his normal morning routine.  He had no pain associated with the vision loss.  He went to work and when he was talking about there they told him he had to be evaluated by an eye doctor.  He went to [facility] and they advised him that he had a retinal artery occlusion and gave him a list of stroke work-up items to take to his PCP.  According to patient his wife was quite worried and made him come into the emergency room.  Patient reports that before he had complete vision loss in the left eye and now he has a little keyhole that he can see through.  He reports that he does have high blood pressure and he takes his blood pressure medication every day.  He has high cholesterol he takes a cholesterol medication every day.  He is not a smoker.  He does not monitor his blood pressure at home.  He reports that he thinks he is prediabetic.  Patient has no other complaints at this time.   Clinical Impression  Patient functioning at baseline for functional mobility and gait other than c/o decreased vision in left eye.  Patient demonstrates good return for ambulation in room and hallways without loss of balance.  Patient discharged to care of nursing for ambulation daily as tolerated for length of stay.      Follow Up Recommendations No PT follow up    Equipment Recommendations  None recommended by PT    Recommendations for Other Services       Precautions / Restrictions Precautions Precautions: None Restrictions Weight Bearing  Restrictions: No      Mobility  Bed Mobility Overal bed mobility: Independent                  Transfers Overall transfer level: Independent                  Ambulation/Gait Ambulation/Gait assistance: Modified independent (Device/Increase time) Gait Distance (Feet): 150 Feet Assistive device: None Gait Pattern/deviations: WFL(Within Functional Limits) Gait velocity: slightly decreased   General Gait Details: demonstrates good return for ambulation in room, hallways without loss of balance  Stairs            Wheelchair Mobility    Modified Rankin (Stroke Patients Only)       Balance Overall balance assessment: Independent                                           Pertinent Vitals/Pain Pain Assessment: No/denies pain    Home Living Family/patient expects to be discharged to:: Private residence Living Arrangements: Spouse/significant other Available Help at Discharge: Available 24 hours/day;Family Type of Home: House Home Access: Stairs to enter Entrance Stairs-Rails: None Entrance Stairs-Number of Steps: 4 Home Layout: One level Home Equipment: Cane - single point      Prior Function Level of  Independence: Independent         Comments: Tourist information centre manager, drives, works     Higher education careers adviser   Dominant Hand: Right    Extremity/Trunk Assessment   Upper Extremity Assessment Upper Extremity Assessment: Defer to OT evaluation    Lower Extremity Assessment Lower Extremity Assessment: Overall WFL for tasks assessed    Cervical / Trunk Assessment Cervical / Trunk Assessment: Normal  Communication   Communication: No difficulties  Cognition Arousal/Alertness: Awake/alert Behavior During Therapy: WFL for tasks assessed/performed Overall Cognitive Status: Within Functional Limits for tasks assessed                                        General Comments      Exercises     Assessment/Plan     PT Assessment Patent does not need any further PT services  PT Problem List         PT Treatment Interventions      PT Goals (Current goals can be found in the Care Plan section)  Acute Rehab PT Goals Patient Stated Goal: return home PT Goal Formulation: With patient Time For Goal Achievement: 08/28/20 Potential to Achieve Goals: Good    Frequency     Barriers to discharge        Co-evaluation PT/OT/SLP Co-Evaluation/Treatment: Yes Reason for Co-Treatment: To address functional/ADL transfers PT goals addressed during session: Mobility/safety with mobility;Balance OT goals addressed during session: ADL's and self-care       AM-PAC PT "6 Clicks" Mobility  Outcome Measure Help needed turning from your back to your side while in a flat bed without using bedrails?: None Help needed moving from lying on your back to sitting on the side of a flat bed without using bedrails?: None Help needed moving to and from a bed to a chair (including a wheelchair)?: None Help needed standing up from a chair using your arms (e.g., wheelchair or bedside chair)?: None Help needed to walk in hospital room?: None Help needed climbing 3-5 steps with a railing? : None 6 Click Score: 24    End of Session   Activity Tolerance: Patient tolerated treatment well Patient left: in chair Nurse Communication: Mobility status PT Visit Diagnosis: Unsteadiness on feet (R26.81);Other abnormalities of gait and mobility (R26.89);Muscle weakness (generalized) (M62.81)    Time: 8264-1583 PT Time Calculation (min) (ACUTE ONLY): 20 min   Charges:   PT Evaluation $PT Eval Moderate Complexity: 1 Mod PT Treatments $Therapeutic Activity: 8-22 mins        11:39 AM, 08/28/20 Ocie Bob, MPT Physical Therapist with [facility] [phone] office [phone] mobile phone

## 2020-08-28 NOTE — Evaluation (Signed)
Occupational Therapy Evaluation Patient Details Name: Scott Chapman MRN: [id number] DOB: [birth year] Today's Date: 08/28/2020    History of Present Illness Scott Chapman  is a 60 y.o. male, with history of hyperlipidemia, hypertension, COPD, presents the ED with a chief complaint of vision loss.  Patient reports that he had acute vision loss in his left eye.  It was like somebody pulled a barrier down over his eye.  His report.  Happened at 7:38 AM on August 17.  He was just doing his normal morning routine.  He had no pain associated with the vision loss.  He went to work and when he was talking about there they told him he had to be evaluated by an eye doctor.  He went to [facility] and they advised him that he had a retinal artery occlusion and gave him a list of stroke work-up items to take to his PCP.  According to patient his wife was quite worried and made him come into the emergency room.  Patient reports that before he had complete vision loss in the left eye and now he has a little keyhole that he can see through.  He reports that he does have high blood pressure and he takes his blood pressure medication every day.  He has high cholesterol he takes a cholesterol medication every day.  He is not a smoker.  He does not monitor his blood pressure at home.  He reports that he thinks he is prediabetic.  Patient has no other complaints at this time.   Clinical Impression   Pt agreeable to OT/PT co-evaluation. Pt demonstrates independence with bed mobility, ambulation, and transfers. Pt was able to don shoes independently while kneeling to the floor. Pt reports blurred vision is L eye. When attempting to read an item in central field of vision pt reported seeing blue, wavy lines and a film over his vision. Pt presents with possible mild L side visual field cut but would benefit from a more formal assessment from a neuro vision specialist. Pt is not recommended for further acute OT services and will be  discharged to care of nursing staff for the remainder of his stay.      Follow Up Recommendations  No OT follow up    Equipment Recommendations  None recommended by OT    Recommendations for Other Services Other (comment) (Neuro vision specialist evaluation.)     Precautions / Restrictions Precautions Precautions: None Restrictions Weight Bearing Restrictions: No      Mobility Bed Mobility Overal bed mobility: Independent                  Transfers Overall transfer level: Independent                    Balance Overall balance assessment: Independent                                         ADL either performed or assessed with clinical judgement   ADL Overall ADL's : Independent                                             Vision Baseline Vision/History: Wears glasses Wears Glasses: At all times Patient Visual Report: Blurring of vision;Peripheral vision  impairment (Pt reports having a "film" over his vision. Pt described his vision as "blue and wavy." Pt also reported mild difficulty with L peripheral vision but had difficulty explaining the impairments.) Vision Assessment?: Yes Eye Alignment: Within Functional Limits Ocular Range of Motion: Within Functional Limits Alignment/Gaze Preference: Within Defined Limits Tracking/Visual Pursuits: Able to track stimulus in all quads without difficulty Convergence: Within functional limits Visual Fields: Other (comment) (Possible mild L side visual field cut.)                Pertinent Vitals/Pain Pain Assessment: No/denies pain     Hand Dominance Right   Extremity/Trunk Assessment Upper Extremity Assessment Upper Extremity Assessment: Overall WFL for tasks assessed   Lower Extremity Assessment Lower Extremity Assessment: Defer to PT evaluation   Cervical / Trunk Assessment Cervical / Trunk Assessment: Normal   Communication Communication Communication: No  difficulties   Cognition Arousal/Alertness: Awake/alert Behavior During Therapy: WFL for tasks assessed/performed Overall Cognitive Status: Within Functional Limits for tasks assessed                                                Home Living Family/patient expects to be discharged to:: Private residence Living Arrangements: Spouse/significant other Available Help at Discharge: Available 24 hours/day;Family Type of Home: House Home Access: Stairs to enter [employer] of Steps: 4 Entrance Stairs-Rails: None Home Layout: One level     Bathroom Shower/Tub: Chief Strategy Officer: Handicapped height Bathroom Accessibility: Yes How Accessible: Accessible via wheelchair;Accessible via walker Home Equipment: Cane - single point          Prior Functioning/Environment Level of Independence: Independent                               OT Goals(Current goals can be found in the care plan section) Acute Rehab OT Goals Patient Stated Goal: return home                   Co-evaluation PT/OT/SLP Co-Evaluation/Treatment: Yes Reason for Co-Treatment: To address functional/ADL transfers   OT goals addressed during session: ADL's and self-care      AM-PAC OT "6 Clicks" Daily Activity     Outcome Measure Help from another person eating meals?: None Help from another person taking care of personal grooming?: None Help from another person toileting, which includes using toliet, bedpan, or urinal?: None Help from another person bathing (including washing, rinsing, drying)?: None Help from another person to put on and taking off regular upper body clothing?: None Help from another person to put on and taking off regular lower body clothing?: None 6 Click Score: 24   End of Session    Activity Tolerance: Patient tolerated treatment well Patient left: in chair;Other (comment) (being taken to exam)  OT Visit Diagnosis: Low vision,  both eyes (H54.2)                Time: 9702-6378 OT Time Calculation (min): 10 min Charges:  OT General Charges $OT Visit: 1 Visit OT Evaluation $OT Eval Low Complexity: 1 Low  Linzy Darling OT, MOT  Danie Chandler 08/28/2020, 9:25 AM

## 2020-08-28 NOTE — Consult Note (Signed)
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 Nonspecific pattern to tingling, may represent peripheral compression. Recommend evaluation of vascular risk factors with MRI brain, MRA head/neck, TTE, lipid panel, HgbA1C. Tolerate permissive hypertension. ASA 81 mg.  CT HEAD: Showed No Acute Hemorrhage or Acute Core Infarct Reviewed  Our recommendations are outlined below.  Diagnostic Studies: Recommend MRI brain without contrast Routine MRA head without contrast and MRA Neck with contrast Transthoracic Echo with bubble study, if available  Laboratory Studies: Recommend Lipid panel Hemoglobin A1c  Medication: Initiate Aspirin 81 mg daily  Nursing Recommendations: Telemetry, IV Fluids, avoid dextrose containing fluids, Maintain euglycemia Neuro checks q4 hrs x 24 hrs and then per shift Head of bed 30 degrees  Consultations: Recommend Speech therapy if failed dysphagia screen Physical therapy/Occupational therapy  Disposition: Neurology will follow   Metrics: [facility] Notification Time: 08/27/2020 23:29:35 Stamp Time: 08/27/2020 23:31:16 Callback Response Time: 08/27/2020 23:34:59   ----------------------------------------------------------------------------------------------------  Chief Complaint: left vision loss, right sided paresthesias  History of Present Illness: Patient is a 60 year old Male.  Patient reports loss of vision in his left eye starting at 7:30 AM. Saw ophthalmologist who diagnosed patient with central retinal artery occlusion. Patient went home and complained of right foot tingling and left arm tingling. Has had left arm tingling before. Came to  ER for further evaluation.   Past Medical History:      Hypertension      Hyperlipidemia  Anticoagulant use:  No  Antiplatelet use: No   Examination: BP(148/111), Pulse(96), Blood Glucose(113) 1A: Level of Consciousness - Alert; keenly responsive + 0 1B: Ask Month and Age - Both Questions Right + 0 1C: Blink Eyes & Squeeze Hands - Performs Both Tasks + 0 2: Test Horizontal Extraocular Movements - Normal + 0 3: Test Visual Fields - Partial Hemianopia + 1 4: Test Facial Palsy (Use Grimace if Obtunded) - Normal symmetry + 0 5A: Test Left Arm Motor Drift - No Drift for 10 Seconds + 0 5B: Test Right Arm Motor Drift - No Drift for 10 Seconds + 0 6A: Test Left Leg Motor Drift - No Drift for 5 Seconds + 0 6B: Test Right Leg Motor Drift - No Drift for 5 Seconds + 0 7: Test Limb Ataxia (FNF/Heel-Shin) - No Ataxia + 0 8: Test Sensation - Normal; No sensory loss + 0 9: Test Language/Aphasia - Normal; No aphasia + 0 10: Test Dysarthria - Normal + 0 11: Test Extinction/Inattention - No abnormality + 0  NIHSS Score: 1     Patient / Family was informed the Neurology Consult would occur via TeleHealth consult by way of interactive audio and video telecommunications and consented to receiving care in this manner.  Patient is being evaluated for possible acute neurologic impairment and high probability of imminent or life - threatening deterioration.I spent total of 20 minutes providing care to this patient, including time for face to face visit via telemedicine, review of medical records, imaging studies and discussion of findings with providers, the patient and / or family.   Dr Rosanne Ashing   [facility] [phone]  Case [id number]

## 2020-08-29 LAB — RPR: RPR Ser Ql: NONREACTIVE

## 2020-08-29 LAB — HOMOCYSTEINE: Homocysteine: 15.2 umol/L — ABNORMAL HIGH (ref 0.0–14.5)

## 2020-08-29 NOTE — Discharge Summary (Signed)
Physician Discharge Summary  Scott Chapman LKG:[id number] DOB: 05/23/1960 DOA: 08/27/2020  PCP: Eartha Inch, MD  Admit date: 08/27/2020 Discharge date: 08/29/2020  Admitted From: Home Disposition: Home   Recommendations for Outpatient Follow-up:  Follow up with PCP in 1-2 weeks for risk factor optimization including:  Treatment of hyperlipidemia (augmented pravastatin to atorvastatin) Treatment of HTN (no changes made here) Initiation of treatment/continuing education regarding new formal diagnosis of diabetes with HbA1c 6.9%. Pt opted for lifestyle modification prior to initiation of metformin. Follow up with cardiology for arranging 30-day monitor to evaluate for occult atrial fibrillation. Follow up with neurology in 4 weeks. Planning DAPT x 1 month followed by single agent thereafter.   Home Health: None recommended Equipment/Devices: None recommended Discharge Condition: Stable CODE STATUS: Full Diet recommendation: Heart healthy, carb-modified  Brief/Interim Summary: Scott Chapman is a 60 y.o. male with a history of HTN, HLD, COPD, ADHD who presented to the ED last night with ongoing painless visual field deficit in the left eye. This first occurred in the morning, abrupt, affecting the left eye. He went to [place] for evaluation where he was told he had a retinal artery occlusion and told to proceed to his PCP for work up but his wife made him come to the ED. CTA showed no acute infarcts, though teleneurology recommended admission for stroke work up and starting aspirin. MRI/MRA revealed a small remote left frontal/parietal cortical infarct on background of mild ischemic changes but no acute finding and patent vasculature. U/S revealed bilateral plaques at carotid bifurcations resulting in < 50% ICA stenosis. He was admitted to complete work up with stable symptoms. Neurology has recommended DAPT followed by single agent after 1 month, and aggressive risk factor modification.  Due to concern for embolic (remote) infarct on MRI, cardiac monitoring as an outpatient is also recommended.  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 No PT or OT follow up is recommended.  - Risk factor modification recommended as detailed below.    Remote left frontal/parietal cortical infarct: noted on MRI with some small vessel disease and no acute stroke.  - Start aspirin, plavix x1 month, then single agent (no antiplatelet PTA).  - Follow up with Dr. Gerilyn Pilgrim 4 weeks - Augment statin from pravastatin 40mg  > atorvastatin 40mg , titrate to LDL <70 and as tolerated. LDL 123.  - Follow up with PCP for diabetes management.   - Echocardiogram negative for cardioembolic source, no dysrhythmias detected during observation period, though 30-day monitoring is recommended.    New diagnosis T2DM: HbA1c 6.9% diagnostic.  - Will benefit from education and PCP follow up. Shared decision making with patient who has been prediabetic in the past and recently underwent steroid taper for knee arthritis. He wishes not to initiate metformin and is willing to initiate lifestyle modifications initially. He will follow up with his PCP.   Obesity: Lifestyle modifications reviewed. Body mass index is 31.17 kg/m.    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 Cessation recommended   ADHD:  - Continue home adderall which is likely cause of +amphetamine on UDS.    Asthma: No flare currently.  - Continue home medication.   Discharge Instructions Discharge Instructions     Diet - low sodium heart healthy   Complete by: As directed    Diet Carb Modified  Complete by: As directed    Discharge instructions   Complete by: As directed    You were admitted for stroke work up which showed no acute stroke, but did show an old stroke. You  have been cleared for discharge with the following recommendations:  - STOP pravastatin, and START atorvastatin to more effectively lower cholesterol. - START taking aspirin  and plavix  daily. Plavix will be taken for 30 days, and then you will continue on only aspirin.  - Follow up with neurology in 4 weeks - Follow up with your PCP in 1-2 weeks to discuss high blood pressure and diabetes. Your A1c indicates your average blood sugar over the past 3 months was high enough to qualify you for type 2 diabetes. You should follow a carbohydrate-restricted diet, aim to increase activity, and losing weight will all help to treat this risk factor.  - You should be contacted to arrange a 30 day heart monitor (which is looking for atrial fibrillation). If you do not, please follow up with your PCP to have this arranged.  - If your symptoms worsen, seek medical attention right away.      Allergies as of 08/28/2020       Reactions   Penicillins Rash   Other reaction(s): Unknown        Medication List     STOP taking these medications    pravastatin 40 MG tablet Commonly known as: PRAVACHOL   predniSONE 20 MG tablet Commonly known as: DELTASONE       TAKE these medications    albuterol (2.5 MG/3ML) 0.083% nebulizer solution Commonly known as: PROVENTIL Take 2.5 mg by nebulization every 6 (six) hours as needed for wheezing or shortness of breath.   aspirin 81 MG EC tablet Take 1 tablet (81 mg total) by mouth daily. Swallow whole.   atorvastatin 40 MG tablet Commonly known as: LIPITOR Take 1 tablet (40 mg total) by mouth daily.   clopidogrel 75 MG tablet Commonly known as: PLAVIX Take 1 tablet (75 mg total) by mouth daily with breakfast.   cyclobenzaprine 5 MG tablet Commonly known as: FLEXERIL Take 5 mg by mouth daily as needed for muscle spasms.   esomeprazole 20 MG capsule Commonly known as: NEXIUM Take 20 mg by mouth daily as needed (indigestion).   fluticasone  50 MCG/ACT nasal spray Commonly known as: FLONASE Place 2 sprays into both nostrils daily.   olmesartan-hydrochlorothiazide 40-25 MG tablet Commonly known as: BENICAR HCT Take 1 tablet by mouth daily.   Symbicort 160-4.5 MCG/ACT inhaler Generic drug: budesonide-formoterol Inhale 2 puffs into the lungs in the morning and at bedtime.   Viagra 100 MG tablet Generic drug: sildenafil Take 100 mg by mouth as needed for erectile dysfunction.        Follow-up Information     Eartha Inch, MD. Schedule an appointment as soon as possible for a visit in 1 week(s).   Specialty: Family Medicine Contact information: [address] [place] [address] [phone]         Beryle Beams, MD. Schedule an appointment as soon as possible for a visit in 1 month(s).   Specialty: Neurology Contact information: [address] [place] [address] [phone]                Allergies  Allergen Reactions   Penicillins Rash    Other reaction(s): Unknown    Consultations: Neurology  Procedures/Studies: CT HEAD WO CONTRAST  Result Date: 08/27/2020 CLINICAL DATA:  Neuro deficit, acute, stroke suspected Left eye vision loss. EXAM: CT HEAD WITHOUT CONTRAST TECHNIQUE: Contiguous axial images were obtained from the base of the skull through the vertex without intravenous contrast. COMPARISON:  None. FINDINGS: Brain: No intracranial hemorrhage, mass effect, or midline shift. No hydrocephalus. The basilar cisterns are patent. No evidence of territorial infarct or acute ischemia. No extra-axial or intracranial fluid collection. Vascular: No hyperdense vessel. Skull: No fracture or focal lesion. Sinuses/Orbits: Paranasal sinuses and mastoid air cells are clear. The visualized orbits are unremarkable. No evidence of globe abnormality. Other: None. IMPRESSION: Negative noncontrast head CT. Electronically Signed   By: Narda Rutherford M.D.   On: 08/27/2020 23:48   MR ANGIO HEAD WO  CONTRAST  Result Date: 08/28/2020 CLINICAL DATA:  Neuro deficit EXAM: MRA NECK WITHOUT AND WITH CONTRAST MRA HEAD WITHOUT CONTRAST TECHNIQUE: Multiplanar and multiecho pulse sequences of the neck were obtained without and with intravenous contrast. Angiographic images of the neck were obtained using MRA technique without and with intravenous contrast; Angiographic images of the Circle of Willis were obtained using MRA technique without intravenous contrast. CONTRAST:  9mL GADAVIST GADOBUTROL 1 MMOL/ML IV SOLN COMPARISON:  Same day brain MRI, CT head 1 day prior FINDINGS: MRA NECK FINDINGS Aortic arch: Normal. Right carotid system: There is no hemodynamically significant stenosis or occlusion. There is no dissection or aneurysm. Left carotid system: There is no hemodynamically significant stenosis or occlusion. There is no dissection or aneurysm. Vertebral arteries: Both vertebral arteries are patent with antegrade flow. Veins: The jugular veins appear patent. MRA HEAD FINDINGS Anterior circulation: The bilateral cavernous ICAs are patent. The bilateral ACAs and MCAs are patent. No aneurysm or AVM is identified. Posterior circulation: The V4 segments of the vertebral arteries are patent. The basilar artery is patent. The bilateral PCAs are patent. There is no aneurysm or AVM. IMPRESSION: Patent vasculature of the head and neck. No significant stenosis, occlusion, dissection, or aneurysm. Electronically Signed   By: Lesia Hausen M.D.   On: 08/28/2020 09:48   MR ANGIO NECK W WO CONTRAST  Result Date: 08/28/2020 CLINICAL DATA:  Neuro deficit EXAM: MRA NECK WITHOUT AND WITH CONTRAST MRA HEAD WITHOUT CONTRAST TECHNIQUE: Multiplanar and multiecho pulse sequences of the neck were obtained without and with intravenous contrast. Angiographic images of the neck were obtained using MRA technique without and with intravenous contrast; Angiographic images of the Circle of Willis were obtained using MRA technique without  intravenous contrast. CONTRAST:  9mL GADAVIST GADOBUTROL 1 MMOL/ML IV SOLN COMPARISON:  Same day brain MRI, CT head 1 day prior FINDINGS: MRA NECK FINDINGS Aortic arch: Normal. Right carotid system: There is no hemodynamically significant stenosis or occlusion. There is no dissection or aneurysm. Left carotid system: There is no hemodynamically significant stenosis or occlusion. There is no dissection or aneurysm. Vertebral arteries: Both vertebral arteries are patent with antegrade flow. Veins: The jugular veins appear patent. MRA HEAD FINDINGS Anterior circulation: The bilateral cavernous ICAs are patent. The bilateral ACAs and MCAs are patent. No aneurysm or AVM is identified. Posterior circulation: The V4 segments of the vertebral arteries are patent. The basilar artery is patent. The bilateral PCAs are patent. There is no aneurysm or AVM. IMPRESSION: Patent vasculature of the head and neck. No significant stenosis, occlusion, dissection, or aneurysm. Electronically Signed   By: Lesia Hausen M.D.   On: 08/28/2020 09:48   MR BRAIN WO CONTRAST  Result Date: 08/28/2020 CLINICAL DATA:  Neuro deficit with acute stroke suspected. Acute vision  loss in the left eye EXAM: MRI HEAD WITHOUT CONTRAST TECHNIQUE: Multiplanar, multiecho pulse sequences of the brain and surrounding structures were obtained without intravenous contrast. COMPARISON:  Head CT from yesterday FINDINGS: Brain: No acute infarction, hemorrhage, hydrocephalus, extra-axial collection or mass lesion. Few remote white matter insults, medical history and age suggesting microvascular ischemia. Small remote left frontal parietal cortex infarct. Brain volume is normal. No abnormality seen along the optic pathways. Vascular: Normal flow voids, including in the left carotid. Skull and upper cervical spine: Normal marrow signal Sinuses/Orbits: Negative. No evidence of intra-ocular collection or optic nerve compression. IMPRESSION: 1. No acute finding or  explanation for symptoms. 2. Mild chronic ischemic changes including a small remote left frontal parietal cortex infarct. Electronically Signed   By: Marnee Spring M.D.   On: 08/28/2020 07:55   US Carotid Bilateral (at [facility] and [facility] only)  Result Date: 08/28/2020 CLINICAL DATA:  Stroke EXAM: BILATERAL CAROTID DUPLEX ULTRASOUND TECHNIQUE: Wallace Cullens scale imaging, color Doppler and duplex ultrasound were performed of bilateral carotid and vertebral arteries in the neck. COMPARISON:  MRA neck from the same day FINDINGS: Criteria: Quantification of carotid stenosis is based on velocity parameters that correlate the residual internal carotid diameter with NASCET-based stenosis levels, using the diameter of the distal internal carotid lumen as the denominator for stenosis measurement. The following velocity measurements were obtained: RIGHT ICA: 111/39 cm/sec CCA: 105/27 cm/sec SYSTOLIC ICA/CCA RATIO:  1.1 ECA: 144 cm/sec LEFT ICA: 111/29 cm/sec CCA: 109/30 cm/sec SYSTOLIC ICA/CCA RATIO:  1.0 ECA: 88 cm/sec RIGHT CAROTID ARTERY: Mild smooth partially calcified plaque in the bulb without significant stenosis. Normal waveforms and color Doppler signal throughout. RIGHT VERTEBRAL ARTERY:  Normal flow direction and waveform. LEFT CAROTID ARTERY: Mild partially calcified plaque in the bulb and proximal ICA with only mild stenosis. Normal waveforms and color Doppler signal. LEFT VERTEBRAL ARTERY:  Normal flow direction and waveform. IMPRESSION: 1. Bilateral carotid bifurcation plaque resulting in less than 50% diameter ICA stenosis. 2. Antegrade bilateral vertebral arterial flow. Electronically Signed   By: Corlis Leak M.D.   On: 08/28/2020 12:16   ECHOCARDIOGRAM COMPLETE  Result Date: 08/28/2020    ECHOCARDIOGRAM REPORT   Patient Name:   Scott Chapman Date of Exam: 08/28/2020 Medical Rec #:  [id number]    Height:       67.0 in Accession #:    [id number]   Weight:       199.0 lb Date of Birth:  Aug 21, 1960   BSA:          2.018  m Patient Age:    59 years     BP:           137/94 mmHg Patient Gender: M            HR:           86 bpm. [facility] Procedure: 2D Echo, Cardiac Doppler and Color Doppler Indications:   Stroke  History:       Patient has no prior history of Echocardiogram examinations.                Stroke.  Sonographer:   Mikki Harbor Referring      [phone] ASIA B ZIERLE-GHOSH Phys: IMPRESSIONS  1. Left ventricular ejection fraction, by estimation, is 60 to 65%. The left ventricle has normal function. The left ventricle has no regional wall motion abnormalities. Left ventricular diastolic parameters were normal.  2. Right ventricular systolic function is normal. The  right ventricular size is normal. There is normal pulmonary artery systolic pressure. The estimated right ventricular systolic pressure is 31.3 mmHg.  3. The mitral valve is grossly normal. Trivial mitral valve regurgitation.  4. The aortic valve is tricuspid. Aortic valve regurgitation is not visualized. Aortic valve mean gradient measures 5.0 mmHg.  5. The inferior vena cava is normal in size with greater than 50% respiratory variability, suggesting right atrial pressure of 3 mmHg. Comparison(s): No prior Echocardiogram. FINDINGS  Left Ventricle: Left ventricular ejection fraction, by estimation, is 60 to 65%. The left ventricle has normal function. The left ventricle has no regional wall motion abnormalities. The left ventricular internal cavity size was normal in size. There is  no left ventricular hypertrophy. Left ventricular diastolic parameters were normal. Right Ventricle: The right ventricular size is normal. No increase in right ventricular wall thickness. Right ventricular systolic function is normal. There is normal pulmonary artery systolic pressure. The tricuspid regurgitant velocity is 2.66 m/s, and  with an assumed right atrial pressure of 3 mmHg, the estimated right ventricular systolic pressure is 31.3 mmHg. Left Atrium: Left  atrial size was normal in size. Right Atrium: Right atrial size was normal in size. Pericardium: There is no evidence of pericardial effusion. Mitral Valve: The mitral valve is grossly normal. Trivial mitral valve regurgitation. MV peak gradient, 3.0 mmHg. The mean mitral valve gradient is 1.0 mmHg. Tricuspid Valve: The tricuspid valve is grossly normal. Tricuspid valve regurgitation is trivial. Aortic Valve: The aortic valve is tricuspid. There is mild aortic valve annular calcification. Aortic valve regurgitation is not visualized. Aortic valve mean gradient measures 5.0 mmHg. Aortic valve peak gradient measures 11.0 mmHg. Aortic valve area, by VTI measures 2.57 cm. Pulmonic Valve: The pulmonic valve was grossly normal. Pulmonic valve regurgitation is trivial. Aorta: The aortic root is normal in size and structure. Venous: The inferior vena cava is normal in size with greater than 50% respiratory variability, suggesting right atrial pressure of 3 mmHg. IAS/Shunts: The interatrial septum appears to be lipomatous. No atrial level shunt detected by color flow Doppler.  LEFT VENTRICLE PLAX 2D LVIDd:         4.99 cm  Diastology LVIDs:         3.23 cm  LV e' medial:    7.45 cm/s LV PW:         0.98 cm  LV E/e' medial:  9.8 LV IVS:        0.96 cm  LV e' lateral:   12.60 cm/s LVOT diam:     2.00 cm  LV E/e' lateral: 5.8 LV SV:         80 LV SV Index:   40 LVOT Area:     3.14 cm  RIGHT VENTRICLE RV Basal diam:  2.84 cm RV Mid diam:    2.76 cm RV S prime:     14.40 cm/s TAPSE (M-mode): 2.2 cm LEFT ATRIUM             Index       RIGHT ATRIUM           Index LA diam:        3.70 cm 1.83 cm/m  RA Area:     11.20 cm LA Vol (A2C):   30.2 ml 14.97 ml/m RA Volume:   23.40 ml  11.60 ml/m LA Vol (A4C):   41.3 ml 20.47 ml/m LA Biplane Vol: 35.5 ml 17.59 ml/m  AORTIC VALVE AV Area (Vmax):    2.33 cm  AV Area (Vmean):   2.41 cm AV Area (VTI):     2.57 cm AV Vmax:           166.00 cm/s AV Vmean:          105.000 cm/s AV VTI:             0.310 m AV Peak Grad:      11.0 mmHg AV Mean Grad:      5.0 mmHg LVOT Vmax:         123.00 cm/s LVOT Vmean:        80.400 cm/s LVOT VTI:          0.254 m LVOT/AV VTI ratio: 0.82  AORTA Ao Root diam: 2.70 cm MITRAL VALVE               TRICUSPID VALVE MV Area (PHT): 3.08 cm    TR Peak grad:   28.3 mmHg MV Area VTI:   3.32 cm    TR Vmax:        266.00 cm/s MV Peak grad:  3.0 mmHg MV Mean grad:  1.0 mmHg    SHUNTS MV Vmax:       0.87 m/s    Systemic VTI:  0.25 m MV Vmean:      50.5 cm/s   Systemic Diam: 2.00 cm MV Decel Time: 246 msec MV E velocity: 72.90 cm/s MV A velocity: 73.30 cm/s MV E/A ratio:  0.99 Nona DellSamuel Mcdowell MD Electronically signed by Nona DellSamuel Mcdowell MD Signature Date/Time: 08/28/2020/5:20:25 PM    Final      Subjective: No change to left eye vision loss, no other focal deficits  Discharge Exam: Vitals:   08/28/20 1434 08/28/20 1630  BP: 130/89 139/77  Pulse: 96 98  Resp: 18 18  Temp: 98.2 F (36.8 C) 98.2 F (36.8 C)  SpO2: 99% 99%   Gen: Nontoxic male in no distress Pulm: Clear and nonlabored on room air  CV: RRR, no murmur, no JVD, no edema GI: Soft, NT, ND, +BS  Neuro: Alert and oriented. Abnormal visual field to confrontation on left eye only. No other focal deficits. Skin: No rashes, lesions or ulcers   Labs: BNP (last 3 results) No results for input(s): BNP in the last 8760 hours. Basic Metabolic Panel: Recent Labs  Lab 08/27/20 2320 08/28/20 0424  NA 137 138  K 4.1 4.4  CL 101 99  CO2 25 30  GLUCOSE 113* 114*  BUN 21* 20  CREATININE 0.90 0.88  CALCIUM 9.7 9.8  MG  --  2.1   Liver Function Tests: Recent Labs  Lab 08/27/20 2320 08/28/20 0424  AST 24 21  ALT 34 34  ALKPHOS 62 56  BILITOT 0.6 0.7  PROT 7.1 6.8  ALBUMIN 3.8 3.7   No results for input(s): LIPASE, AMYLASE in the last 168 hours. No results for input(s): AMMONIA in the last 168 hours. CBC: Recent Labs  Lab 08/27/20 2320 08/28/20 0424  WBC 9.5 8.5  NEUTROABS 6.6  --    HGB 15.2 14.9  HCT 46.2 47.0  MCV 94.1 95.3  PLT 269 272   Cardiac Enzymes: No results for input(s): CKTOTAL, CKMB, CKMBINDEX, TROPONINI in the last 168 hours. BNP: Invalid input(s): POCBNP CBG: No results for input(s): GLUCAP in the last 168 hours. D-Dimer No results for input(s): DDIMER in the last 72 hours. Hgb A1c Recent Labs    08/28/20 0424  HGBA1C 6.9*   Lipid Profile Recent Labs    08/28/20 0424  CHOL 204*  HDL 55  LDLCALC 123*  TRIG 128  CHOLHDL 3.7   Thyroid function studies No results for input(s): TSH, T4TOTAL, T3FREE, THYROIDAB in the last 72 hours.  Invalid input(s): FREET3 Anemia work up Recent Labs    08/28/20 1655  VITAMINB12 279   Urinalysis    Component Value Date/Time   COLORURINE AMBER (A) 08/28/2020 0012   APPEARANCEUR TURBID (A) 08/28/2020 0012   LABSPEC 1.019 08/28/2020 0012   PHURINE 7.0 08/28/2020 0012   GLUCOSEU NEGATIVE 08/28/2020 0012   HGBUR NEGATIVE 08/28/2020 0012   BILIRUBINUR NEGATIVE 08/28/2020 0012   KETONESUR NEGATIVE 08/28/2020 0012   PROTEINUR NEGATIVE 08/28/2020 0012   NITRITE NEGATIVE 08/28/2020 0012   LEUKOCYTESUR NEGATIVE 08/28/2020 0012    Microbiology Recent Results (from the past 240 hour(s))  Resp Panel by RT-PCR (Flu A&B, Covid) Nasopharyngeal Swab     Status: None   Collection Time: 08/27/20 12:12 AM   Specimen: Nasopharyngeal Swab; Nasopharyngeal(NP) swabs in vial transport medium  Result Value Ref Range Status   SARS Coronavirus 2 by RT PCR NEGATIVE NEGATIVE Final    Comment: (NOTE) SARS-CoV-2 target nucleic acids are NOT DETECTED.  The SARS-CoV-2 RNA is generally detectable in upper respiratory specimens during the acute phase of infection. The lowest concentration of SARS-CoV-2 viral copies this assay can detect is 138 copies/mL. A negative result does not preclude SARS-Cov-2 infection and should not be used as the sole basis for treatment or other patient management decisions. A negative  result may occur with  improper specimen collection/handling, submission of specimen other than nasopharyngeal swab, presence of viral mutation(s) within the areas targeted by this assay, and inadequate number of viral copies(<138 copies/mL). A negative result must be combined with clinical observations, patient history, and epidemiological information. The expected result is Negative.  Fact Sheet for Patients:  BloggerCourse.com  Fact Sheet for Healthcare Providers:  SeriousBroker.it  This test is no t yet approved or cleared by the [place] FDA and  has been authorized for detection and/or diagnosis of SARS-CoV-2 by FDA under an Emergency Use Authorization (EUA). This EUA will remain  in effect (meaning this test can be used) for the duration of the COVID-19 declaration under Section 564(b)(1) of the Act, 21 U.S.C.section 360bbb-3(b)(1), unless the authorization is terminated  or revoked sooner.       Influenza A by PCR NEGATIVE NEGATIVE Final   Influenza B by PCR NEGATIVE NEGATIVE Final    Comment: (NOTE) The Xpert Xpress SARS-CoV-2/FLU/RSV plus assay is intended as an aid in the diagnosis of influenza from Nasopharyngeal swab specimens and should not be used as a sole basis for treatment. Nasal washings and aspirates are unacceptable for Xpert Xpress SARS-CoV-2/FLU/RSV testing.  Fact Sheet for Patients: BloggerCourse.com  Fact Sheet for Healthcare Providers: SeriousBroker.it  This test is not yet approved or cleared by the [place] FDA and has been authorized for detection and/or diagnosis of SARS-CoV-2 by FDA under an Emergency Use Authorization (EUA). This EUA will remain in effect (meaning this test can be used) for the duration of the COVID-19 declaration under Section 564(b)(1) of the Act, 21 U.S.C. section 360bbb-3(b)(1), unless the authorization is  terminated or revoked.  Performed at [facility], [address], [place], [place] [address]     Time coordinating discharge: Approximately 40 minutes  Tyrone Nine, MD  [facility] 08/29/2020, 3:42 PM

## 2020-09-02 ENCOUNTER — Other Ambulatory Visit: Payer: Self-pay

## 2020-09-02 DIAGNOSIS — I639 Cerebral infarction, unspecified: Secondary | ICD-10-CM

## 2020-09-02 NOTE — Progress Notes (Signed)
30 day event monitor ordered per Hospitalist Dr. Jarvis Newcomer for CVA

## 2020-09-07 DIAGNOSIS — I639 Cerebral infarction, unspecified: Secondary | ICD-10-CM

## 2020-09-08 ENCOUNTER — Ambulatory Visit (INDEPENDENT_AMBULATORY_CARE_PROVIDER_SITE_OTHER): Payer: BC Managed Care – PPO

## 2020-09-08 ENCOUNTER — Other Ambulatory Visit: Payer: Self-pay

## 2020-09-08 DIAGNOSIS — I639 Cerebral infarction, unspecified: Secondary | ICD-10-CM

## 2020-09-29 ENCOUNTER — Telehealth: Payer: Self-pay | Admitting: Cardiology

## 2020-09-29 NOTE — Telephone Encounter (Signed)
Darl Pikes (spouse)  called requesting results of recent heart monitor. Patient wants to know if he will be following Dr. Wyline Mood.  [phone]. Was seen recently at [facility].

## 2020-10-01 NOTE — Telephone Encounter (Signed)
Spoke with wife and informed that results from monitor will not bed back until pt finishes wearing monitor. Wife thankful of the call.

## 2023-05-05 ENCOUNTER — Other Ambulatory Visit: Payer: Self-pay

## 2023-05-05 ENCOUNTER — Ambulatory Visit: Attending: Neurological Surgery | Admitting: Physical Therapy

## 2023-05-05 DIAGNOSIS — M5459 Other low back pain: Secondary | ICD-10-CM | POA: Insufficient documentation

## 2023-05-05 NOTE — Therapy (Signed)
 OUTPATIENT PHYSICAL THERAPY THORACOLUMBAR EVALUATION   Patient Name: Scott Chapman MRN: [id number] DOB:1960/07/27, 63 y.o., male Today's Date: 05/05/2023  END OF SESSION:  PT End of Session - 05/05/23 1554     Visit Number 1    Number of Visits 4    Date for PT Re-Evaluation 06/02/23    PT Start Time 0239    PT Stop Time 0315    PT Time Calculation (min) 36 min    Activity Tolerance Patient tolerated treatment well    Behavior During Therapy [facility] for tasks assessed/performed             Past Medical History:  Diagnosis Date   Allergy    Hyperlipidemia    Hypertension    Past Surgical History:  Procedure Laterality Date   Colapsed lung Right    foot surgery Right    HERNIA REPAIR     KNEE SURGERY Bilateral    left arm surgery     ROTATOR CUFF REPAIR Right    TENDON REPAIR Right    arm   Patient Active Problem List   Diagnosis Date Noted   CVA (cerebral vascular accident) (HCC) 08/28/2020   REFERRING PROVIDER: Elna Haggis MD  REFERRING DIAG: Spondylolisthesis at L5-S1  Rationale for Evaluation and Treatment: Rehabilitation  THERAPY DIAG:  Other low back pain  ONSET DATE: 01/27/23 (surgery date).    SUBJECTIVE:                                                                                                                                                                                           SUBJECTIVE STATEMENT: The patient presents to the clinic with c/o of some low back pain with radiation into his left buttock.  He had a lumbar decompressive surgery on 02/05/23 at L3-4 and did very well.  A couple weeks after his surgery he tripped over a cinder block and fell forward causing a significant return of his back pain and pain into his left buttock  He wants a couple of PT visits and is also scheduled for an injection on 05/12/23.    PERTINENT HISTORY:  See above.    PAIN:  Are you having pain? Yes: NPRS scale: 8/10. Pain location: Lower lumbar and left  buttock Pain description: Ache, sharp Aggravating factors: Moving too much. Relieving factors: Rest.  PRECAUTIONS: None  RED FLAGS: None   WEIGHT BEARING RESTRICTIONS: No  FALLS:  Has patient fallen in last 6 months? Yes. Number of falls 4.  Prior to surgery.  LIVING ENVIRONMENT: Lives with: lives with their spouse Lives in: House/apartment Has following equipment at home: None  PLOF: Independent  PATIENT GOALS: Be able to do ADL's with less pain.  OBJECTIVE:  Note: Objective measures were completed at Evaluation unless otherwise noted.   PATIENT SURVEYS:  Modified Oswestry 31/50.   PALPATION: Mild low back tenderness.  Incisional site is well healed.  LUMBAR ROM:   Active lumbar flexion limited by 25% and extension to 15 degrees.    LOWER EXTREMITY ROM:    WNL.  LOWER EXTREMITY MMT:    Normal LE strength.  GAIT: Essentially normal gait pattern.  DTR'S:   Left Patellar 1+/4 and right is 2+/4+.  Achilles bilaterally are 2+/4+.  TREATMENT DATE: 05/05/23:      SKTC (2 minutes each side), DKTC x 2 minutes, hip bridges and mini-crunches to fatigue x 2.                                                                                                                         PATIENT EDUCATION:  Education details: See below. Person educated: Patient Education method: Explanation, Demonstration, and Handouts Education comprehension: verbalized understanding and returned demonstration  HOME EXERCISE PROGRAM: SKTC  [[id number]]  SINGLE KNEE TO CHEST STRETCH - SKTC -  Repeat 3 Repetitions, Hold 1 Minute, Complete 1 Set, Perform 3 Times a Day  DOUBLE KNEE TO CHEST STRETCH - DKTC -  Repeat 2 Repetitions, Hold 1 Minute, Complete 1 Set, Perform 3 Times a Day  Bridges -  Repeat 15 Repetitions, Hold 2 Seconds, Complete 2 Sets, Perform 2 Times a Day  ASSESSMENT:  CLINICAL IMPRESSION: The patient presents to OPPT with c/o low back pain and radiation into his left  buttock.  He had a very good surgical outcome (L3-4 decompression) on 02/05/23 but unfortunately fell a couple weeks after surgery causing him very significant pain.  He has mild low back tenderness with a CC of left buttock pain.  His LE strength is normal.  His Modified Owestry score is 31/50.   Patient will benefit from skilled physical therapy intervention to address pain and deficits.   OBJECTIVE IMPAIRMENTS: decreased activity tolerance, decreased ROM, and pain.   ACTIVITY LIMITATIONS: carrying, lifting, and bending  PARTICIPATION LIMITATIONS: yard work  PERSONAL FACTORS: 1 comorbidity: prior lumbar surgery  are also affecting patient's functional outcome.   REHAB POTENTIAL: Good  CLINICAL DECISION MAKING: Evolving/moderate complexity  EVALUATION COMPLEXITY: Low   GOALS:  LONG TERM GOALS: Target date: 06/02/23  Ind with a HEP.  Goal status: INITIAL  2.  Patient to display excellent knowledge of body mechanics as it pertains to his ADL's.. Baseline:  Goal status: INITIAL  PLAN:  PT FREQUENCY: 1x/week  PT DURATION: 4 weeks  PLANNED INTERVENTIONS: 97110-Therapeutic exercises, 97530- Therapeutic activity, [id number]- Neuromuscular re-education, 97535- Self Care, [id number]- Manual therapy, [id number]- Electrical stimulation (unattended), 97035- Ultrasound, Cryotherapy, and Moist heat.  PLAN FOR NEXT SESSION: Core exercise progression, spinal protection techniques and body mechanics training.    Lorena Clearman, [place], PT 05/05/2023, 4:58 PM

## 2023-05-17 ENCOUNTER — Encounter: Payer: Self-pay | Admitting: *Deleted

## 2023-05-17 ENCOUNTER — Ambulatory Visit: Attending: Neurological Surgery | Admitting: *Deleted

## 2023-05-17 DIAGNOSIS — M25612 Stiffness of left shoulder, not elsewhere classified: Secondary | ICD-10-CM | POA: Insufficient documentation

## 2023-05-17 DIAGNOSIS — M5459 Other low back pain: Secondary | ICD-10-CM | POA: Insufficient documentation

## 2023-05-17 DIAGNOSIS — M25512 Pain in left shoulder: Secondary | ICD-10-CM | POA: Diagnosis present

## 2023-05-17 DIAGNOSIS — G8929 Other chronic pain: Secondary | ICD-10-CM | POA: Diagnosis present

## 2023-05-17 NOTE — Therapy (Signed)
 OUTPATIENT PHYSICAL THERAPY THORACOLUMBAR TREATMENT   Patient Name: Scott Chapman MRN: [id number] DOB:Aug 26, 1960, 63 y.o., male Today's Date: 05/17/2023  END OF SESSION:  PT End of Session - 05/17/23 1516     Visit Number 2    Number of Visits 4    Date for PT Re-Evaluation 06/02/23    PT Start Time 1515    PT Stop Time 1600    PT Time Calculation (min) 45 min             Past Medical History:  Diagnosis Date   Allergy    Hyperlipidemia    Hypertension    Past Surgical History:  Procedure Laterality Date   Colapsed lung Right    foot surgery Right    HERNIA REPAIR     KNEE SURGERY Bilateral    left arm surgery     ROTATOR CUFF REPAIR Right    TENDON REPAIR Right    arm   Patient Active Problem List   Diagnosis Date Noted   CVA (cerebral vascular accident) (HCC) 08/28/2020   REFERRING PROVIDER: Elna Haggis MD  REFERRING DIAG: Spondylolisthesis at L5-S1  Rationale for Evaluation and Treatment: Rehabilitation  THERAPY DIAG:  Other low back pain  ONSET DATE: 01/27/23 (surgery date).    SUBJECTIVE:                                                                                                                                                                                           SUBJECTIVE STATEMENT: DC after today. Having shldr surgery    PERTINENT HISTORY:  See above.    PAIN:  Are you having pain? Yes: NPRS scale: 8/10. Pain location: Lower lumbar and left buttock Pain description: Ache, sharp Aggravating factors: Moving too much. Relieving factors: Rest.  PRECAUTIONS: None  RED FLAGS: None   WEIGHT BEARING RESTRICTIONS: No  FALLS:  Has patient fallen in last 6 months? Yes. Number of falls 4.  Prior to surgery.  LIVING ENVIRONMENT: Lives with: lives with their spouse Lives in: House/apartment Has following equipment at home: None  PLOF: Independent  PATIENT GOALS: Be able to do ADL's with less pain.  OBJECTIVE:  Note:  Objective measures were completed at Evaluation unless otherwise noted.   PATIENT SURVEYS:  Modified Oswestry 31/50.   PALPATION: Mild low back tenderness.  Incisional site is well healed.  LUMBAR ROM:   Active lumbar flexion limited by 25% and extension to 15 degrees.    LOWER EXTREMITY ROM:    WNL.  LOWER EXTREMITY MMT:    Normal LE strength.  GAIT: Essentially normal gait pattern.  DTR'S:  Left Patellar 1+/4 and right is 2+/4+.  Achilles bilaterally are 2+/4+.  TREATMENT DATE: 05/17/23:         Discussed and reviewed AB bracing with   movement patterns to decrease pain triggers with ADL's and daily transitional movements that continue to increase his pain.                                                                                                                         PATIENT EDUCATION:  Education details: See below. Person educated: Patient Education method: Explanation, Demonstration, and Handouts Education comprehension: verbalized understanding and returned demonstration  HOME EXERCISE PROGRAM: SKTC  [[id number]]  SINGLE KNEE TO CHEST STRETCH - SKTC -  Repeat 3 Repetitions, Hold 1 Minute, Complete 1 Set, Perform 3 Times a Day  DOUBLE KNEE TO CHEST STRETCH - DKTC -  Repeat 2 Repetitions, Hold 1 Minute, Complete 1 Set, Perform 3 Times a Day  Bridges -  Repeat 15 Repetitions, Hold 2 Seconds, Complete 2 Sets, Perform 2 Times a Day  ASSESSMENT:  CLINICAL IMPRESSION: The patient presents to OPPT with c/o low back pain and radiation into his left buttock. Rx focused on        Discussing and  AB bracing as well as neutral pelvis with   movement patterns to decrease pain triggers with ADL's and daily transitional movements that continue to increase his  pain. Pt reports good  knowledge of movement patterns but needs practice. LTG# 2 partially met due to needs practice at movement patterns.                                                                                                                   OBJECTIVE IMPAIRMENTS: decreased activity tolerance, decreased ROM, and pain.   ACTIVITY LIMITATIONS: carrying, lifting, and bending  PARTICIPATION LIMITATIONS: yard work  PERSONAL FACTORS: 1 comorbidity: prior lumbar surgery are also affecting patient's functional outcome.   REHAB POTENTIAL: Good  CLINICAL DECISION MAKING: Evolving/moderate complexity  EVALUATION COMPLEXITY: Low   GOALS:  LONG TERM GOALS: Target date: 06/02/23  Ind with a HEP.  Goal status: MET  2.  Patient to display excellent knowledge of body mechanics as it pertains to his ADL's.. Baseline:  Goal status: Partially Met  PLAN:  PT FREQUENCY: 1x/week  PT DURATION: 4 weeks  PLANNED INTERVENTIONS: 97110-Therapeutic exercises, 97530- Therapeutic activity, [id number]- Neuromuscular re-education, 97535- Self Care, [id number]- Manual therapy, [id number]- Electrical stimulation (unattended), 97035- Ultrasound, Cryotherapy, and Moist heat.  PLAN FOR NEXT  SESSION: DC to HEP due to having shldr surgery    Paislea Hatton,CHRIS, PTA 05/17/2023, 5:51 PM

## 2023-05-30 ENCOUNTER — Encounter: Payer: Self-pay | Admitting: Physical Therapy

## 2023-05-30 ENCOUNTER — Ambulatory Visit: Admitting: Physical Therapy

## 2023-05-30 ENCOUNTER — Other Ambulatory Visit: Payer: Self-pay

## 2023-05-30 DIAGNOSIS — M5459 Other low back pain: Secondary | ICD-10-CM | POA: Diagnosis not present

## 2023-05-30 DIAGNOSIS — M25612 Stiffness of left shoulder, not elsewhere classified: Secondary | ICD-10-CM

## 2023-05-30 DIAGNOSIS — G8929 Other chronic pain: Secondary | ICD-10-CM

## 2023-05-30 NOTE — Therapy (Signed)
 OUTPATIENT PHYSICAL THERAPY SHOULDER EVALUATION   Patient Name: Scott Chapman MRN: [id number] DOB:[birth year], 63 y.o., male Today's Date: 05/30/2023  END OF SESSION:  PT End of Session - 05/30/23 1458     Visit Number 1    Number of Visits 4    Date for PT Re-Evaluation 06/27/23    PT Start Time 0228    PT Stop Time 0312    PT Time Calculation (min) 44 min    Activity Tolerance Patient tolerated treatment well    Behavior During Therapy [facility] for tasks assessed/performed             Past Medical History:  Diagnosis Date   Allergy    Hyperlipidemia    Hypertension    Past Surgical History:  Procedure Laterality Date   Colapsed lung Right    foot surgery Right    HERNIA REPAIR     KNEE SURGERY Bilateral    left arm surgery     ROTATOR CUFF REPAIR Right    TENDON REPAIR Right    arm   Patient Active Problem List   Diagnosis Date Noted   CVA (cerebral vascular accident) (HCC) 08/28/2020    REFERRING PROVIDER: Ellard Gunning MD  REFERRING DIAG: Left shoulder RCR, SAD, DCR and biceps tendonesis.   THERAPY DIAG:  Chronic left shoulder pain  Stiffness of left shoulder, not elsewhere classified  Rationale for Evaluation and Treatment: Rehabilitation  ONSET DATE: 05/20/23 (surgery date).    SUBJECTIVE:                                                                                                                                                                                      SUBJECTIVE STATEMENT: The patient presents to the clinic s/p left shoulder RCR, SAD, DCR and biceps tendonesis performed on 05/20/23.  He is wearing a sling with abduction pillow.  We discussed the importance of being compliant to his sling usage.  He rates his pain as severe today (9-10/10).  PERTINENT HISTORY: See above.    PAIN:  Are you having pain? Yes: NPRS scale: 9-10/10. Pain location: Left shoulder. Pain description: Throbbing. Aggravating factors: Movement out of  sling. Relieving factors: Pain meds and ice.  PRECAUTIONS: Other: Begin with gentle PROM per protocol.    RED FLAGS: None   WEIGHT BEARING RESTRICTIONS: No  FALLS:  Has patient fallen in last 6 months? Yes. Number of falls 4.  Prior to surgery.    LIVING ENVIRONMENT: Lives with: lives with their spouse Lives in: House/apartment Has following equipment at home: None  OCCUPATION: Currently out of work.   PLOF: Independent  PATIENT GOALS:Use left UE with  reduced pain.    NEXT MD VISIT:   OBJECTIVE:  Note: Objective measures were completed at Evaluation unless otherwise noted.  PATIENT SURVEYS:  Quick Dash 86.36.  UPPER EXTREMITY ROM:   In supine:  Gentle left shoulder PROM into flexion to 95 degrees and ER to 25 degrees.  OBSERVATION: Steri-strips intact and some remaining ecchymosis observed.                                                                                                                               TREATMENT DATE: 05/30/23;  In supine:  Gentle passive range of motion x 8 minutes into left shoulder flexion and ER per protocol f/b vasopneumatic on low with pillow between left elbow and thorax.     PATIENT EDUCATION: Education details:  Person educated:  International aid/development worker:  Education comprehension:   HOME EXERCISE PROGRAM:   ASSESSMENT:  CLINICAL IMPRESSION: The patient presents to OPPT s/p left shoulder RCR, SAD, DCR and biceps tendonesis performed on 05/20/23.  He has an expected loss of range of motion.  His Quick DASH score is 86.36.  He tolerated passive range of motion her protocol without complaint.  Patient will benefit from skilled physical therapy intervention to address pain and deficits.   OBJECTIVE IMPAIRMENTS: decreased activity tolerance, decreased ROM, and pain.   ACTIVITY LIMITATIONS: carrying, lifting, and reach over head  PARTICIPATION LIMITATIONS: meal prep, cleaning, laundry, shopping, community activity, and  occupation  REHAB POTENTIAL: Excellent  CLINICAL DECISION MAKING: Stable/uncomplicated  EVALUATION COMPLEXITY: Low   GOALS:  SHORT TERM GOALS: Target date: 06/13/23  Ind with an initial HEP. Goal status: INITIAL  LONG TERM GOALS: Target date: 08/08/23 (goal timelines in accord with protocol guidelines).  Ind with an advanced HEP.  Goal status: INITIAL  2.  Active left shoulder flexion to 145 degrees so the patient can easily reach overhead.  Goal status: INITIAL  3.  Active ER to 70 degrees+ to allow for easily donning/doffing of apparel.  Goal status: INITIAL  4.  Increase left shoulder strength to a solid 4+/5 to increase stability for performance of functional activities.  Goal status: INITIAL  5.  Increase ROM so patient is able to reach behind back to L3.  Goal status: INITIAL  6.  Perform ADL's with left shoulder pain not > 3/10.  Goal status: INITIAL  PLAN:  PT FREQUENCY: 1x/week  PT DURATION: 4 weeks (current referral).  PLANNED INTERVENTIONS: 97110-Therapeutic exercises, 97530- Therapeutic activity, [id number]- Neuromuscular re-education, 97535- Self Care, 16109- Manual therapy, [id number]- Electrical stimulation (unattended), 97016- Vasopneumatic device, [id number]- Ultrasound, and Cryotherapy  PLAN FOR NEXT SESSION: Gentle PROM per protocol.  Vasopneumatic with pillow between left elbow and thorax.   Jamone Garrido, [place], PT 05/30/2023, 3:50 PM

## 2023-06-09 ENCOUNTER — Ambulatory Visit: Admitting: *Deleted

## 2023-06-09 DIAGNOSIS — M5459 Other low back pain: Secondary | ICD-10-CM | POA: Diagnosis not present

## 2023-06-09 DIAGNOSIS — G8929 Other chronic pain: Secondary | ICD-10-CM

## 2023-06-09 DIAGNOSIS — M25612 Stiffness of left shoulder, not elsewhere classified: Secondary | ICD-10-CM

## 2023-06-09 NOTE — Therapy (Signed)
 OUTPATIENT PHYSICAL THERAPY SHOULDER EVALUATION   Patient Name: Scott Chapman MRN: [id number] DOB:Aug 10, 1960, 63 y.o., male Today's Date: 06/09/2023  END OF SESSION:  PT End of Session - 06/09/23 1459     Visit Number 2    Number of Visits 4    Date for PT Re-Evaluation 06/27/23    PT Start Time 1430    PT Stop Time 1520    PT Time Calculation (min) 50 min             Past Medical History:  Diagnosis Date   Allergy    Hyperlipidemia    Hypertension    Past Surgical History:  Procedure Laterality Date   Colapsed lung Right    foot surgery Right    HERNIA REPAIR     KNEE SURGERY Bilateral    left arm surgery     ROTATOR CUFF REPAIR Right    TENDON REPAIR Right    arm   Patient Active Problem List   Diagnosis Date Noted   CVA (cerebral vascular accident) (HCC) 08/28/2020    REFERRING PROVIDER: Ellard Gunning MD  REFERRING DIAG: Left shoulder RCR, SAD, DCR and biceps tendonesis.   THERAPY DIAG:  Chronic left shoulder pain  Stiffness of left shoulder, not elsewhere classified  Other low back pain  Rationale for Evaluation and Treatment: Rehabilitation  ONSET DATE: 05/20/23 (surgery date).    SUBJECTIVE:                                                                                                                                                                                      SUBJECTIVE STATEMENT: The patient presents to the clinic s/p left shoulder RCR, SAD, DCR and biceps tendonesis performed on 05/20/23.  He is wearing a sling with abduction pillow and reports pain 5-6/10  Doing HEP    PERTINENT HISTORY: See above.    PAIN:  Are you having pain? Yes: NPRS scale: 5-6/10. Pain location: Left shoulder. Pain description: Throbbing. Aggravating factors: Movement out of sling. Relieving factors: Pain meds and ice.  PRECAUTIONS: Other: Begin with gentle PROM per protocol.    RED FLAGS: None   WEIGHT BEARING RESTRICTIONS: No  FALLS:  Has patient  fallen in last 6 months? Yes. Number of falls 4.  Prior to surgery.    LIVING ENVIRONMENT: Lives with: lives with their spouse Lives in: House/apartment Has following equipment at home: None  OCCUPATION: Currently out of work.   PLOF: Independent  PATIENT GOALS:Use left UE with reduced pain.    NEXT MD VISIT:   OBJECTIVE:  Note: Objective measures were completed at Evaluation unless otherwise noted.  PATIENT SURVEYS:  Quick Dash 86.36.  UPPER EXTREMITY ROM:   In supine:  Gentle left shoulder PROM into flexion to 95 degrees and ER to 25 degrees.  OBSERVATION: Steri-strips intact and some remaining ecchymosis observed.                                                                                                                               TREATMENT DATE: 06/09/23;     In supine:  Gentle passive range of motion into left shoulder flexion and ER per protocol as well as instructing Pt in HEP for PROM.   ER to 45 degrees and flexion to 90 degrees IFC 80-150hz  x 15 mins with  vasopneumatic on low with pillow between left elbow and thorax.     PATIENT EDUCATION: Education details:  Person educated:  International aid/development worker:  Education comprehension:   HOME EXERCISE PROGRAM:   ASSESSMENT:  CLINICAL IMPRESSION: The patient presents to OPPT s/p left shoulder RCR, SAD, DCR and biceps tendonesis performed on 05/20/23.  Manual PROM for elevation and ER as well as HEP for elevation and ER. IFC and Vaso end of session.  OBJECTIVE IMPAIRMENTS: decreased activity tolerance, decreased ROM, and pain.   ACTIVITY LIMITATIONS: carrying, lifting, and reach over head  PARTICIPATION LIMITATIONS: meal prep, cleaning, laundry, shopping, community activity, and occupation  REHAB POTENTIAL: Excellent  CLINICAL DECISION MAKING: Stable/uncomplicated  EVALUATION COMPLEXITY: Low   GOALS:  SHORT TERM GOALS: Target date: 06/13/23  Ind with an initial HEP. Goal status: INITIAL  LONG TERM  GOALS: Target date: 08/08/23 (goal timelines in accord with protocol guidelines).  Ind with an advanced HEP.  Goal status: INITIAL  2.  Active left shoulder flexion to 145 degrees so the patient can easily reach overhead.  Goal status: INITIAL  3.  Active ER to 70 degrees+ to allow for easily donning/doffing of apparel.  Goal status: INITIAL  4.  Increase left shoulder strength to a solid 4+/5 to increase stability for performance of functional activities.  Goal status: INITIAL  5.  Increase ROM so patient is able to reach behind back to L3.  Goal status: INITIAL  6.  Perform ADL's with left shoulder pain not > 3/10.  Goal status: INITIAL  PLAN:  PT FREQUENCY: 1x/week  PT DURATION: 4 weeks (current referral).  PLANNED INTERVENTIONS: 97110-Therapeutic exercises, 97530- Therapeutic activity, [id number]- Neuromuscular re-education, 97535- Self Care, 16109- Manual therapy, G0283- Electrical stimulation (unattended), 97016- Vasopneumatic device, [id number]- Ultrasound, and Cryotherapy  PLAN FOR NEXT SESSION: Gentle PROM per protocol.  Vasopneumatic with pillow between left elbow and thorax.   Scott Chapman,Scott Chapman, PTA 06/09/2023, 5:14 PM

## 2023-12-04 NOTE — Progress Notes (Unsigned)
 New Patient Pulmonology Office Visit   Subjective:  Patient ID: Scott Chapman, male    DOB: 31-Jan-1960  MRN: [id number]  Referred by: Chinita Hoy CROME, PA*  CC: No chief complaint on file.   HPI Scott Chapman is a 63 y.o. male with ***  Sthma  {PULM QUESTIONNAIRES (Optional):33196}  ROS  Allergies: Penicillins  Current Outpatient Medications:    albuterol  (PROVENTIL ) (2.5 MG/3ML) 0.083% nebulizer solution, Take 2.5 mg by nebulization every 6 (six) hours as needed for wheezing or shortness of breath., Disp: , Rfl:    aspirin  EC 81 MG EC tablet, Take 1 tablet (81 mg total) by mouth daily. Swallow whole., Disp: 30 tablet, Rfl: 11   atorvastatin  (LIPITOR) 40 MG tablet, Take 1 tablet (40 mg total) by mouth daily., Disp: 30 tablet, Rfl: 1   clopidogrel  (PLAVIX ) 75 MG tablet, Take 1 tablet (75 mg total) by mouth daily with breakfast., Disp: 30 tablet, Rfl: 0   cyclobenzaprine (FLEXERIL) 5 MG tablet, Take 5 mg by mouth daily as needed for muscle spasms., Disp: , Rfl:    esomeprazole (NEXIUM) 20 MG capsule, Take 20 mg by mouth daily as needed (indigestion)., Disp: , Rfl:    fluticasone (FLONASE) 50 MCG/ACT nasal spray, Place 2 sprays into both nostrils daily., Disp: , Rfl:    olmesartan -hydrochlorothiazide  (BENICAR  HCT) 40-25 MG tablet, Take 1 tablet by mouth daily., Disp: , Rfl:    SYMBICORT 160-4.5 MCG/ACT inhaler, Inhale 2 puffs into the lungs in the morning and at bedtime., Disp: , Rfl: 0   VIAGRA 100 MG tablet, Take 100 mg by mouth as needed for erectile dysfunction., Disp: , Rfl: 2 Past Medical History:  Diagnosis Date   Allergy     Hyperlipidemia    Hypertension    Past Surgical History:  Procedure Laterality Date   Colapsed lung Right    foot surgery Right    HERNIA REPAIR     KNEE SURGERY Bilateral    left arm surgery     ROTATOR CUFF REPAIR Right    TENDON REPAIR Right    arm   Family History  Problem Relation Age of Onset   Stroke Father    Social History    Socioeconomic History   Marital status: Married    Spouse name: Not on file   Number of children: Not on file   Years of education: Not on file   Highest education level: Not on file  Occupational History   Not on file  Tobacco Use   Smoking status: Never   Smokeless tobacco: Never  Substance and Sexual Activity   Alcohol use: Yes    Alcohol/week: 0.0 standard drinks of alcohol   Drug use: No   Sexual activity: Not on file  Other Topics Concern   Not on file  Social History Narrative   Not on file   Social Drivers of Health   Financial Resource Strain: Low Risk  (03/15/2023)   Received from [facility]   Overall Financial Resource Strain (CARDIA)    Difficulty of Paying Living Expenses: Not very hard  Food Insecurity: No Food Insecurity (03/15/2023)   Received from [facility]   Hunger Vital Sign    Within the past 12 months, you worried that your food would run out before you got the money to buy more.: Never true    Within the past 12 months, the food you bought just didn't last and you didn't have money to get more.: Never true  Transportation Needs: No Transportation Needs (03/15/2023)   Received from [facility] - Transportation    Lack of Transportation (Medical): No    Lack of Transportation (Non-Medical): No  Physical Activity: Unknown (03/15/2023)   Received from [facility]   Exercise Vital Sign    On average, how many days per week do you engage in moderate to strenuous exercise (like a brisk walk)?: 0 days    Minutes of Exercise per Session: Not on file  Recent Concern: Physical Activity - Inactive (03/15/2023)   Received from [facility]   Exercise Vital Sign    Days of Exercise per Week: 0 days    Minutes of Exercise per Session: 30 min  Stress: Stress Concern Present (03/15/2023)   Received from [facility] - Occupational Stress Questionnaire    Feeling of Stress : To some extent  Social  Connections: Socially Integrated (03/15/2023)   Received from [facility]   Social Network    How would you rate your social network (family, work, friends)?: Good participation with social networks  Intimate Partner Violence: Not At Risk (03/15/2023)   Received from [facility]   HITS    Over the last 12 months how often did your partner physically hurt you?: Never    Over the last 12 months how often did your partner insult you or talk down to you?: Never    Over the last 12 months how often did your partner threaten you with physical harm?: Never    Over the last 12 months how often did your partner scream or curse at you?: Never       Objective:  There were no vitals taken for this visit. {Pulm Vitals (Optional):32837}  Physical Exam  Diagnostic Review:  {Labs (Optional):32838}     Assessment & Plan:   Assessment & Plan    No follow-ups on file.    Marny Patch, MD Pulmonary and Critical Care Medicine [facility]

## 2023-12-05 ENCOUNTER — Ambulatory Visit (INDEPENDENT_AMBULATORY_CARE_PROVIDER_SITE_OTHER)

## 2023-12-05 VITALS — BP 158/90 | HR 69 | Temp 98.0°F | Ht 67.0 in | Wt 196.4 lb

## 2023-12-05 DIAGNOSIS — J4540 Moderate persistent asthma, uncomplicated: Secondary | ICD-10-CM | POA: Diagnosis not present

## 2023-12-05 DIAGNOSIS — R0602 Shortness of breath: Secondary | ICD-10-CM

## 2023-12-05 DIAGNOSIS — I2510 Atherosclerotic heart disease of native coronary artery without angina pectoris: Secondary | ICD-10-CM

## 2023-12-05 LAB — NITRIC OXIDE: Nitric Oxide: 12

## 2023-12-05 MED ORDER — ALBUTEROL SULFATE (2.5 MG/3ML) 0.083% IN NEBU: 2.5000 mg | INHALATION_SOLUTION | Freq: Four times a day (QID) | RESPIRATORY_TRACT | 3 refills | Status: AC | PRN

## 2023-12-05 MED ORDER — SPIRIVA RESPIMAT 2.5 MCG/ACT IN AERS: 2.0000 | INHALATION_SPRAY | Freq: Every day | RESPIRATORY_TRACT | 6 refills | Status: AC

## 2023-12-05 NOTE — Patient Instructions (Addendum)
 Dear Scott Chapman;   Given that you have  worsening of your shortness of breath I will do a detailed work up: -Echocardiogram  -Blood test -Pulmonary function test  -Chest Xray  I will add Spiriva  2 puffs in the morning to maximize therapy.   I will see you in 2 months.

## 2023-12-06 ENCOUNTER — Ambulatory Visit: Payer: Self-pay

## 2023-12-06 LAB — CBC WITH DIFFERENTIAL/PLATELET
Basophils Absolute: 0.1 K/uL (ref 0.0–0.1)
Basophils Relative: 1.3 % (ref 0.0–3.0)
Eosinophils Absolute: 0.2 K/uL (ref 0.0–0.7)
Eosinophils Relative: 2.6 % (ref 0.0–5.0)
HCT: 45.5 % (ref 39.0–52.0)
Hemoglobin: 15 g/dL (ref 13.0–17.0)
Lymphocytes Relative: 16.8 % (ref 12.0–46.0)
Lymphs Abs: 1.2 K/uL (ref 0.7–4.0)
MCHC: 33.1 g/dL (ref 30.0–36.0)
MCV: 92.8 fl (ref 78.0–100.0)
Monocytes Absolute: 0.6 K/uL (ref 0.1–1.0)
Monocytes Relative: 8.1 % (ref 3.0–12.0)
Neutro Abs: 5.2 K/uL (ref 1.4–7.7)
Neutrophils Relative %: 71.2 % (ref 43.0–77.0)
Platelets: 285 K/uL (ref 150.0–400.0)
RBC: 4.9 Mil/uL (ref 4.22–5.81)
RDW: 14.1 % (ref 11.5–15.5)
WBC: 7.3 K/uL (ref 4.0–10.5)

## 2023-12-07 ENCOUNTER — Ambulatory Visit: Attending: Cardiology

## 2023-12-07 DIAGNOSIS — J4540 Moderate persistent asthma, uncomplicated: Secondary | ICD-10-CM

## 2023-12-07 LAB — ECHOCARDIOGRAM COMPLETE
AR max vel: 2.62 cm2
AV Peak grad: 11.6 mmHg
Ao pk vel: 1.7 m/s
Area-P 1/2: 3.17 cm2
Calc EF: 55.6 %
S' Lateral: 3.5 cm
Single Plane A2C EF: 52.4 %
Single Plane A4C EF: 58.3 %

## 2023-12-14 LAB — CAT DANDER COMPONENT
E220-IgE Fel d 2: <0.1 kU/L (ref <0.10)
E228-IgE Fel d 4: 10.4 kU/L — ABNORMAL HIGH (ref <0.10)
E231-IgE Fel d 7: <0.1 kU/L (ref <0.10)
Fel d 1 (e94) IgE: >100 kU/L — ABNORMAL HIGH (ref <0.10)

## 2023-12-14 LAB — DOG DANDER COMPONENT
Can f 4(e229) IgE: <0.1 kU/L (ref <0.10)
E101-IgE Can f 1: <0.1 kU/L (ref <0.10)
E102-IgE Can f 2: <0.1 kU/L (ref <0.10)
E221-IgE Can f 3: <0.1 kU/L (ref <0.10)
E226-IgE Can f 5: 0.72 kU/L — ABNORMAL HIGH (ref <0.10)
E230-IGE CAN F 6: 0.39 kU/L — ABNORMAL HIGH (ref <0.10)

## 2023-12-14 LAB — RESPIRATORY ALLERGY PANEL REGION II W/ RFLX: ~~LOC~~
Allergen, A. alternata, m6: <0.1 kU/L
Allergen, Cedar tree, t12: <0.1 kU/L
Allergen, Comm Silver Birch, t9: <0.1 kU/L
Allergen, Cottonwood, t14: <0.1 kU/L
Allergen, D pternoyssinus,d7: 14.3 kU/L — ABNORMAL HIGH
Allergen, Mouse Urine Protein, e78: <0.1 kU/L
Allergen, Mulberry, t76: <0.1 kU/L
Allergen, Oak,t7: <0.1 kU/L
Allergen, P. notatum, m1: <0.1 kU/L
Aspergillus fumigatus, m3: <0.1 kU/L
Bermuda Grass: <0.1 kU/L
Box Elder IgE: <0.1 kU/L
CLADOSPORIUM HERBARUM (M2) IGE: <0.1 kU/L
COMMON RAGWEED (SHORT) (W1) IGE: <0.1 kU/L
Cat Dander: >100 kU/L — ABNORMAL HIGH
Class: 0
Class: 0
Class: 0
Class: 0
Class: 0
Class: 0
Class: 0
Class: 0
Class: 0
Class: 0
Class: 0
Class: 0
Class: 0
Class: 0
Class: 0
Class: 0
Class: 0
Class: 0
Class: 0
Class: 2
Class: 3
Class: 3
Class: 6
Cockroach: 0.3 kU/L — ABNORMAL HIGH
D. farinae: 14.3 kU/L — ABNORMAL HIGH
Dog Dander: 2.19 kU/L — ABNORMAL HIGH
Elm IgE: <0.1 kU/L
IgE (Immunoglobulin E), Serum: 451 kU/L — ABNORMAL HIGH (ref <=114)
Johnson Grass: <0.1 kU/L
Pecan/Hickory Tree IgE: <0.1 kU/L
Rough Pigweed  IgE: <0.1 kU/L
Sheep Sorrel IgE: <0.1 kU/L
Timothy Grass: <0.1 kU/L

## 2023-12-14 LAB — INTERPRETATION:

## 2023-12-14 LAB — CARDIO IQ® NT PROBNP: NT PROBNP: 82 pg/mL (ref <125)
